# Patient Record
Sex: Female | Born: 1975 | Hispanic: No | Smoking: Current every day smoker
Health system: Southern US, Community
[De-identification: ages and names within clinical notes are randomized; demographics above are authoritative.]

## PROBLEM LIST (undated history)

## (undated) DIAGNOSIS — H9190 Unspecified hearing loss, unspecified ear: Secondary | ICD-10-CM

## (undated) DIAGNOSIS — F32A Depression, unspecified: Secondary | ICD-10-CM

## (undated) DIAGNOSIS — G709 Myoneural disorder, unspecified: Secondary | ICD-10-CM

## (undated) DIAGNOSIS — E0590 Thyrotoxicosis, unspecified without thyrotoxic crisis or storm: Secondary | ICD-10-CM

## (undated) DIAGNOSIS — N920 Excessive and frequent menstruation with regular cycle: Secondary | ICD-10-CM

## (undated) DIAGNOSIS — N8110 Cystocele, unspecified: Secondary | ICD-10-CM

## (undated) DIAGNOSIS — M1990 Unspecified osteoarthritis, unspecified site: Secondary | ICD-10-CM

## (undated) DIAGNOSIS — J449 Chronic obstructive pulmonary disease, unspecified: Secondary | ICD-10-CM

## (undated) DIAGNOSIS — F329 Major depressive disorder, single episode, unspecified: Secondary | ICD-10-CM

## (undated) DIAGNOSIS — R102 Pelvic and perineal pain: Secondary | ICD-10-CM

## (undated) DIAGNOSIS — N83209 Unspecified ovarian cyst, unspecified side: Secondary | ICD-10-CM

## (undated) DIAGNOSIS — F419 Anxiety disorder, unspecified: Secondary | ICD-10-CM

## (undated) DIAGNOSIS — Z5189 Encounter for other specified aftercare: Secondary | ICD-10-CM

## (undated) HISTORY — PX: FOOT SURGERY: SHX648

## (undated) HISTORY — PX: OTHER SURGICAL HISTORY: SHX169

## (undated) HISTORY — DX: Depression, unspecified: F32.A

## (undated) HISTORY — DX: Anxiety disorder, unspecified: F41.9

## (undated) HISTORY — DX: Cystocele, unspecified: N81.10

## (undated) HISTORY — DX: Encounter for other specified aftercare: Z51.89

## (undated) HISTORY — DX: Pelvic and perineal pain: R10.2

## (undated) HISTORY — DX: Chronic obstructive pulmonary disease, unspecified: J44.9

## (undated) HISTORY — DX: Myoneural disorder, unspecified: G70.9

## (undated) HISTORY — DX: Major depressive disorder, single episode, unspecified: F32.9

## (undated) HISTORY — DX: Excessive and frequent menstruation with regular cycle: N92.0

## (undated) HISTORY — PX: WISDOM TOOTH EXTRACTION: SHX21

---

## 2001-05-23 HISTORY — PX: DIAGNOSTIC LAPAROSCOPY: SUR761

## 2006-03-15 ENCOUNTER — Emergency Department (HOSPITAL_COMMUNITY): Admission: EM | Admit: 2006-03-15 | Discharge: 2006-03-15 | Payer: Self-pay | Admitting: Emergency Medicine

## 2007-05-24 DIAGNOSIS — G709 Myoneural disorder, unspecified: Secondary | ICD-10-CM

## 2007-05-24 HISTORY — DX: Myoneural disorder, unspecified: G70.9

## 2009-04-07 ENCOUNTER — Emergency Department (HOSPITAL_COMMUNITY): Admission: EM | Admit: 2009-04-07 | Discharge: 2009-04-07 | Payer: Self-pay | Admitting: Emergency Medicine

## 2010-08-25 LAB — URINALYSIS, ROUTINE W REFLEX MICROSCOPIC
Ketones, ur: NEGATIVE mg/dL
Protein, ur: NEGATIVE mg/dL
Urobilinogen, UA: 0.2 mg/dL (ref 0.0–1.0)

## 2010-08-25 LAB — POCT PREGNANCY, URINE: Preg Test, Ur: NEGATIVE

## 2010-08-25 LAB — URINE MICROSCOPIC-ADD ON

## 2011-07-12 ENCOUNTER — Encounter (HOSPITAL_COMMUNITY): Payer: Self-pay | Admitting: Emergency Medicine

## 2011-07-12 ENCOUNTER — Emergency Department (HOSPITAL_COMMUNITY)
Admission: EM | Admit: 2011-07-12 | Discharge: 2011-07-12 | Disposition: A | Discharge status: Home / Self Care | Payer: Self-pay | Attending: Emergency Medicine | Admitting: Emergency Medicine

## 2011-07-12 DIAGNOSIS — N949 Unspecified condition associated with female genital organs and menstrual cycle: Secondary | ICD-10-CM | POA: Insufficient documentation

## 2011-07-12 NOTE — ED Notes (Signed)
Pt. Called x[facility] 1. Interpreter stated pt. Left to get a drink.

## 2011-07-12 NOTE — ED Notes (Signed)
Called for interpreter at 1645.  Interpreter here at 1715.

## 2011-07-12 NOTE — ED Notes (Addendum)
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

## 2011-07-12 NOTE — ED Notes (Signed)
Patient leaving now--thru sign interperter making it known that she won't have a ride home later and that she will follow up with PCP in AM.

## 2011-07-13 ENCOUNTER — Encounter (HOSPITAL_COMMUNITY): Payer: Self-pay | Admitting: Emergency Medicine

## 2011-07-13 ENCOUNTER — Emergency Department (HOSPITAL_COMMUNITY): Payer: Medicaid Other

## 2011-07-13 ENCOUNTER — Emergency Department (HOSPITAL_COMMUNITY)
Admission: EM | Admit: 2011-07-13 | Discharge: 2011-07-13 | Disposition: A | Discharge status: Home Health | Payer: Medicaid Other | Attending: Emergency Medicine | Admitting: Emergency Medicine

## 2011-07-13 DIAGNOSIS — R102 Pelvic and perineal pain: Secondary | ICD-10-CM

## 2011-07-13 DIAGNOSIS — F17200 Nicotine dependence, unspecified, uncomplicated: Secondary | ICD-10-CM | POA: Insufficient documentation

## 2011-07-13 DIAGNOSIS — R1032 Left lower quadrant pain: Secondary | ICD-10-CM | POA: Insufficient documentation

## 2011-07-13 LAB — URINALYSIS, ROUTINE W REFLEX MICROSCOPIC
Bilirubin Urine: NEGATIVE
Nitrite: NEGATIVE
Specific Gravity, Urine: <1.005 — ABNORMAL LOW (ref 1.005–1.030)
pH: 6.5 (ref 5.0–8.0)

## 2011-07-13 LAB — URINE MICROSCOPIC-ADD ON

## 2011-07-13 LAB — CBC
MCH: 31.9 pg (ref 26.0–34.0)
MCHC: 35.2 g/dL (ref 30.0–36.0)
MCV: 90.4 fL (ref 78.0–100.0)
Platelets: 445 10*3/uL — ABNORMAL HIGH (ref 150–400)
RBC: 4.27 MIL/uL (ref 3.87–5.11)
RDW: 14.6 % (ref 11.5–15.5)

## 2011-07-13 LAB — POCT I-STAT, CHEM 8
BUN: 5 mg/dL — ABNORMAL LOW (ref 6–23)
Calcium, Ion: 1.18 mmol/L (ref 1.12–1.32)
Chloride: 107 mEq/L (ref 96–112)
Glucose, Bld: 89 mg/dL (ref 70–99)
Potassium: 3.5 mEq/L (ref 3.5–5.1)

## 2011-07-13 LAB — DIFFERENTIAL
Basophils Absolute: 0.1 10*3/uL (ref 0.0–0.1)
Basophils Relative: 1 % (ref 0–1)
Eosinophils Absolute: 0.2 10*3/uL (ref 0.0–0.7)
Eosinophils Relative: 2 % (ref 0–5)
Neutrophils Relative %: 69 % (ref 43–77)

## 2011-07-13 LAB — WET PREP, GENITAL
Trich, Wet Prep: NONE SEEN
Yeast Wet Prep HPF POC: NONE SEEN

## 2011-07-13 LAB — POCT PREGNANCY, URINE: Preg Test, Ur: NEGATIVE

## 2011-07-13 MED ORDER — OXYCODONE-ACETAMINOPHEN 5-325 MG PO TABS: 1.0000 | ORAL_TABLET | ORAL | Status: AC | PRN

## 2011-07-13 MED ORDER — OXYCODONE-ACETAMINOPHEN 5-325 MG PO TABS
1.0000 | ORAL_TABLET | Freq: Once | ORAL | Status: AC
  Administered 2011-07-13: 1 via ORAL
  Filled 2011-07-13: qty 1

## 2011-07-13 NOTE — ED Provider Notes (Signed)
Medical screening examination/treatment/procedure(s) were performed by non-physician practitioner and as supervising physician I was immediately available for consultation/collaboration.  Juliet Rude. Rubin Payor, MD 07/13/11 [phone]

## 2011-07-13 NOTE — ED Notes (Signed)
Interpretor now at bedside

## 2011-07-13 NOTE — ED Provider Notes (Signed)
History     CSN: [id number]  Arrival date & time 07/13/11  0820   First MD Initiated Contact with Patient 07/13/11 [phone]      Chief Complaint  Patient presents with  . Abdominal Pain    (Consider location/radiation/quality/duration/timing/severity/associated sxs/prior treatment) Patient is a 36 y.o. female presenting with abdominal pain. The history is provided by the patient.  Abdominal Pain The primary symptoms of the illness include abdominal pain. The primary symptoms of the illness do not include fever, nausea or vomiting. 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 She reports regular left pelvic pain in associationwith her menses that is markedly worse in the last 2 months.   The abdominal pain is located in the LLQ. The abdominal pain is relieved by nothing.  The patient has not had a change in bowel habit. Additional symptoms associated with the illness include frequency. Symptoms associated with the illness do not include chills, urgency or hematuria.    History reviewed. No pertinent past medical history.  Past Surgical History  Procedure Date  . Ovary tumor     History reviewed. No pertinent family history.  History  Substance Use Topics  . Smoking status: Current Everyday Smoker -- 1.0 packs/day  . Smokeless tobacco: Not on file  . Alcohol Use: No    OB History    Grav Para Term Preterm Abortions TAB SAB Ect Mult Living                  Review of Systems  Constitutional: Negative for fever and chills.  HENT: Negative.   Respiratory: Negative.   Cardiovascular: Negative.   Gastrointestinal: Positive for abdominal pain. Negative for nausea and vomiting.  Genitourinary: Positive for frequency. Negative for urgency and hematuria.  Musculoskeletal: Negative.   Skin: Negative.   Neurological: Negative.     Allergies  Review of patient's allergies indicates no known allergies.  Home Medications  No current  outpatient prescriptions on file.  BP 107/60  Pulse 75  Temp(Src) 97.6 F (36.4 C) (Oral)  Resp 24  SpO2 99%  LMP 06/20/2011  Physical Exam  Constitutional: She appears well-developed and well-nourished.  HENT:  Head: Normocephalic.  Neck: Normal range of motion. Neck supple.  Cardiovascular: Normal rate and regular rhythm.   Pulmonary/Chest: Effort normal and breath sounds normal.  Abdominal: Soft. Bowel sounds are normal. There is no tenderness. There is no rebound and no guarding.  Genitourinary: Vagina normal and uterus normal. No vaginal discharge found.       No adnexal mass palpable. Left adnexal tenderness.  Musculoskeletal: Normal range of motion.  Neurological: She is alert. No cranial nerve deficit.  Skin: Skin is warm and dry. No rash noted.  Psychiatric: She has a normal mood and affect.    ED Course  Procedures (including critical care time)  Labs Reviewed  URINALYSIS, ROUTINE W REFLEX MICROSCOPIC - Abnormal; Notable for the following:    Specific Gravity, Urine <1.005 (*)    Hgb urine dipstick SMALL (*)    Leukocytes, UA TRACE (*)    All other components within normal limits  URINE MICROSCOPIC-ADD ON - Abnormal; Notable for the following:    Squamous Epithelial / LPF FEW (*)    All other components within normal limits  CBC - Abnormal; Notable for the following:    WBC 11.4 (*)    Platelets 445 (*)    All other components within normal limits  DIFFERENTIAL - Abnormal;  Notable for the following:    Neutro Abs 7.9 (*)    All other components within normal limits  POCT I-STAT, CHEM 8 - Abnormal; Notable for the following:    BUN 5 (*)    All other components within normal limits  POCT PREGNANCY, URINE  WET PREP, GENITAL  GC/CHLAMYDIA PROBE AMP, GENITAL   No results found.   No diagnosis found.    MDM          Rodena Medin, PA-C 07/13/11 1226

## 2011-07-13 NOTE — ED Notes (Signed)
Patient is waiting for the interpreter

## 2011-07-13 NOTE — Discharge Instructions (Signed)
YOUR ULTRASOUND, BLOOD AND URINE TESTS ARE ALL NEGATIVE FOR EVIDENCE OF INFECTION  OR RECURRENT OVARIAN TUMOR. YOU CAN BE DISCHARGED HOME AND SHOULD FOLLOW UP WITH A GYNECOLOGIST CLINIC OF YOUR CHOICE. RETURN HERE AS NEEDED.

## 2011-07-13 NOTE — ED Notes (Signed)
Patient is hearing impaired with c/o ovary pain.  States that she as here last night but had to leave because of the bus leaving and she had to get home.

## 2011-07-13 NOTE — ED Notes (Signed)
Patient transported to Ultrasound 

## 2011-12-21 ENCOUNTER — Encounter (HOSPITAL_COMMUNITY): Payer: Self-pay | Admitting: *Deleted

## 2011-12-21 ENCOUNTER — Emergency Department (HOSPITAL_COMMUNITY): Payer: Medicaid Other

## 2011-12-21 ENCOUNTER — Emergency Department (HOSPITAL_COMMUNITY)
Admission: EM | Admit: 2011-12-21 | Discharge: 2011-12-21 | Disposition: A | Discharge status: Home / Self Care | Payer: Medicaid Other | Attending: Emergency Medicine | Admitting: Emergency Medicine

## 2011-12-21 DIAGNOSIS — N83209 Unspecified ovarian cyst, unspecified side: Secondary | ICD-10-CM | POA: Insufficient documentation

## 2011-12-21 DIAGNOSIS — F17200 Nicotine dependence, unspecified, uncomplicated: Secondary | ICD-10-CM | POA: Insufficient documentation

## 2011-12-21 DIAGNOSIS — M549 Dorsalgia, unspecified: Secondary | ICD-10-CM | POA: Insufficient documentation

## 2011-12-21 HISTORY — DX: Unspecified hearing loss, unspecified ear: H91.90

## 2011-12-21 LAB — URINALYSIS, ROUTINE W REFLEX MICROSCOPIC
Leukocytes, UA: NEGATIVE
Nitrite: NEGATIVE
Protein, ur: NEGATIVE mg/dL
Specific Gravity, Urine: 1.019 (ref 1.005–1.030)
Urobilinogen, UA: 0.2 mg/dL (ref 0.0–1.0)

## 2011-12-21 LAB — CBC WITH DIFFERENTIAL/PLATELET
Basophils Absolute: 0.1 10*3/uL (ref 0.0–0.1)
Basophils Relative: 1 % (ref 0–1)
Eosinophils Relative: 2 % (ref 0–5)
HCT: 40 % (ref 36.0–46.0)
Hemoglobin: 14 g/dL (ref 12.0–15.0)
Lymphocytes Relative: 28 % (ref 12–46)
MCHC: 35 g/dL (ref 30.0–36.0)
MCV: 92 fL (ref 78.0–100.0)
Monocytes Absolute: 0.8 10*3/uL (ref 0.1–1.0)
Monocytes Relative: 7 % (ref 3–12)
Neutro Abs: 6.8 10*3/uL (ref 1.7–7.7)
RDW: 14.7 % (ref 11.5–15.5)

## 2011-12-21 LAB — COMPREHENSIVE METABOLIC PANEL
AST: 18 U/L (ref 0–37)
BUN: 7 mg/dL (ref 6–23)
CO2: 22 mEq/L (ref 19–32)
Calcium: 8.6 mg/dL (ref 8.4–10.5)
Chloride: 104 mEq/L (ref 96–112)
Creatinine, Ser: 0.55 mg/dL (ref 0.50–1.10)
GFR calc Af Amer: >90 mL/min (ref >90)
GFR calc non Af Amer: >90 mL/min (ref >90)
Total Bilirubin: 0.3 mg/dL (ref 0.3–1.2)

## 2011-12-21 LAB — URINE MICROSCOPIC-ADD ON

## 2011-12-21 LAB — WET PREP, GENITAL
Clue Cells Wet Prep HPF POC: NONE SEEN
WBC, Wet Prep HPF POC: NONE SEEN

## 2011-12-21 LAB — PREGNANCY, URINE: Preg Test, Ur: NEGATIVE

## 2011-12-21 MED ORDER — HYDROCODONE-ACETAMINOPHEN 5-325 MG PO TABS
1.0000 | ORAL_TABLET | Freq: Once | ORAL | Status: AC
  Administered 2011-12-21: 1 via ORAL
  Filled 2011-12-21: qty 1

## 2011-12-21 MED ORDER — HYDROCODONE-ACETAMINOPHEN 5-325 MG PO TABS: 1.0000 | ORAL_TABLET | ORAL | Status: AC | PRN

## 2011-12-21 NOTE — ED Notes (Signed)
C/o left side pain, abd pain and swelling. Sign language interpreter called for pt. Pt writes that she is having 'ovary pain real bad'.

## 2011-12-21 NOTE — ED Notes (Signed)
Had left ovarian cyst removed in 2003. C/o LLQ/suprapubic area pain since Feb (seen here for same in Feb). Was instructed to f/u with gynecology but has not been able to see one. C/o worsening pain, heavy menstrual bleeding, bloating & nausea since then. Reports pain much worse with periods. Last emesis 1 week ago.  C/o LLQ pain radiates into LLE.

## 2011-12-21 NOTE — ED Notes (Signed)
Patient transported to Ultrasound 

## 2011-12-21 NOTE — ED Provider Notes (Signed)
History     CSN: [id number]  Arrival date & time 12/21/11  6213   First MD Initiated Contact with Patient 12/21/11 1001      Chief Complaint  Patient presents with  . Abdominal Pain  . Back Pain    (Consider location/radiation/quality/duration/timing/severity/associated sxs/prior treatment) HPI Comments: Is a 36 year old woman who has recurring bouts of left lower quadrant abdominal pain. She was seen here in February, and was found to have a left ovarian cyst. Recently her pain has recurred. She's had some vomiting and diarrhea some painful urination her last menstrual period was July 5-10, and was heavy with clots. She has not seen a gynecologist recently. He was obtained with the help of interpreter for the deaf.  Patient is a 36 y.o. female presenting with abdominal pain. The history is provided by the patient and medical records. A language interpreter was used.  Abdominal Pain The primary symptoms of the illness include abdominal pain, nausea and vomiting. The primary symptoms of the illness do not include fever or diarrhea. Episode onset: Chronic recurring issue. The onset of the illness was gradual. The problem has been gradually worsening.  Associated with: Nothing. The patient states that she believes she is currently not pregnant. The patient has not had a change in bowel habit. Risk factors for an acute abdominal problem include a history of abdominal surgery. Symptoms associated with the illness do not include chills, urgency, hematuria, frequency or back pain. Associated medical issues comments: History of left ovarian cyst..    Past Medical History  Diagnosis Date  . Deaf     Past Surgical History  Procedure Date  . Ovary tumor     cyst    History reviewed. No pertinent family history.  History  Substance Use Topics  . Smoking status: Current Everyday Smoker -- 1.0 packs/day  . Smokeless tobacco: Not on file  . Alcohol Use: No    OB History    Grav Para  Term Preterm Abortions TAB SAB Ect Mult Living                  Review of Systems  Constitutional: Negative for fever and chills.  HENT: Negative.   Eyes: Negative.   Respiratory: Negative.   Cardiovascular: Negative.   Gastrointestinal: Positive for nausea, vomiting and abdominal pain. Negative for diarrhea.  Genitourinary: Positive for pelvic pain. Negative for urgency, frequency and hematuria.  Musculoskeletal: Negative for back pain.  Skin: Negative.   Neurological: Negative.   Psychiatric/Behavioral: Negative.     Allergies  Review of patient's allergies indicates no known allergies.  Home Medications   Current Outpatient Rx  Name Route Sig Dispense Refill  . NAPROXEN SODIUM 220 MG PO TABS Oral Take 220-440 mg by mouth daily as needed. As needed for pain.      BP 118/75  Pulse 92  Temp 98.6 F (37 C) (Oral)  Resp 20  SpO2 97%  LMP 11/25/2011  Physical Exam  Constitutional: She is oriented to person, place, and time. She appears well-developed and well-nourished.       In moderate distress with pain in the left lower abdominal quadrant.  HENT:  Head: Normocephalic and atraumatic.  Right Ear: External ear normal.  Left Ear: External ear normal.  Mouth/Throat: Oropharynx is clear and moist.  Eyes: Conjunctivae and EOM are normal. Pupils are equal, round, and reactive to light.  Neck: Normal range of motion. Neck supple.  Cardiovascular: Normal rate, regular rhythm and normal heart sounds.  Pulmonary/Chest: Effort normal and breath sounds normal.  Abdominal: Soft. Bowel sounds are normal.       Mild LLQ tenderness.   Genitourinary:       BUS negative.  No discharge.  Has no uterine tenderness, but does have left adnexal tenderness.   Neurological: She is alert and oriented to person, place, and time.       She is deaf.  Otherwise neurologically intact.  Skin: Skin is warm and dry.  Psychiatric: She has a normal mood and affect. Her behavior is normal.     ED Course  Procedures (including critical care time)  Labs Reviewed  CBC WITH DIFFERENTIAL - Abnormal; Notable for the following:    WBC 11.1 (*)     All other components within normal limits  PREGNANCY, URINE  COMPREHENSIVE METABOLIC PANEL  URINALYSIS, ROUTINE W REFLEX MICROSCOPIC  URINE CULTURE  WET PREP, GENITAL  GC/CHLAMYDIA PROBE AMP, GENITAL   Results for orders placed during the hospital encounter of 12/21/11  CBC WITH DIFFERENTIAL      Component Value Range   WBC 11.1 (*) 4.0 - 10.5 K/uL   RBC 4.35  3.87 - 5.11 MIL/uL   Hemoglobin 14.0  12.0 - 15.0 g/dL   HCT 09.8  11.9 - 14.7 %   MCV 92.0  78.0 - 100.0 fL   MCH 32.2  26.0 - 34.0 pg   MCHC 35.0  30.0 - 36.0 g/dL   RDW 82.9  56.2 - 13.0 %   Platelets 388  150 - 400 K/uL   Neutrophils Relative 61  43 - 77 %   Neutro Abs 6.8  1.7 - 7.7 K/uL   Lymphocytes Relative 28  12 - 46 %   Lymphs Abs 3.1  0.7 - 4.0 K/uL   Monocytes Relative 7  3 - 12 %   Monocytes Absolute 0.8  0.1 - 1.0 K/uL   Eosinophils Relative 2  0 - 5 %   Eosinophils Absolute 0.2  0.0 - 0.7 K/uL   Basophils Relative 1  0 - 1 %   Basophils Absolute 0.1  0.0 - 0.1 K/uL  COMPREHENSIVE METABOLIC PANEL      Component Value Range   Sodium 136  135 - 145 mEq/L   Potassium 3.7  3.5 - 5.1 mEq/L   Chloride 104  96 - 112 mEq/L   CO2 22  19 - 32 mEq/L   Glucose, Bld 89  70 - 99 mg/dL   BUN 7  6 - 23 mg/dL   Creatinine, Ser 8.65  0.50 - 1.10 mg/dL   Calcium 8.6  8.4 - 78.4 mg/dL   Total Protein 6.8  6.0 - 8.3 g/dL   Albumin 3.7  3.5 - 5.2 g/dL   AST 18  0 - 37 U/L   ALT 13  0 - 35 U/L   Alkaline Phosphatase 74  39 - 117 U/L   Total Bilirubin 0.3  0.3 - 1.2 mg/dL   GFR calc non Af Amer >90  >90 mL/min   GFR calc Af Amer >90  >90 mL/min  URINALYSIS, ROUTINE W REFLEX MICROSCOPIC      Component Value Range   Color, Urine YELLOW  YELLOW   APPearance CLEAR  CLEAR   Specific Gravity, Urine 1.019  1.005 - 1.030   pH 6.0  5.0 - 8.0   Glucose, UA NEGATIVE   NEGATIVE mg/dL   Hgb urine dipstick MODERATE (*) NEGATIVE   Bilirubin Urine NEGATIVE  NEGATIVE  Ketones, ur NEGATIVE  NEGATIVE mg/dL   Protein, ur NEGATIVE  NEGATIVE mg/dL   Urobilinogen, UA 0.2  0.0 - 1.0 mg/dL   Nitrite NEGATIVE  NEGATIVE   Leukocytes, UA NEGATIVE  NEGATIVE  PREGNANCY, URINE      Component Value Range   Preg Test, Ur NEGATIVE  NEGATIVE  WET PREP, GENITAL      Component Value Range   Yeast Wet Prep HPF POC NONE SEEN  NONE SEEN   Trich, Wet Prep NONE SEEN  NONE SEEN   Clue Cells Wet Prep HPF POC NONE SEEN  NONE SEEN   WBC, Wet Prep HPF POC NONE SEEN  NONE SEEN  URINE MICROSCOPIC-ADD ON      Component Value Range   Squamous Epithelial / LPF FEW (*) RARE   WBC, UA 0-2  <3 WBC/hpf   RBC / HPF 3-6  <3 RBC/hpf   Bacteria, UA FEW (*) RARE   Urine-Other MUCOUS PRESENT     US Transvaginal Non-ob  12/21/2011  *RADIOLOGY REPORT*  Clinical Data: Left sided pelvic pain.  History of left ovarian cyst.  TRANSVAGINAL ULTRASOUND OF PELVIS  Technique:  Transvaginal ultrasound examination of the pelvis was performed including evaluation of the uterus, ovaries, adnexal regions, and pelvic cul-de-sac.  Comparison:  Pelvic ultrasound 07/13/2011  Findings:  Uterus:  Normal in size and appearance.  Endometrium: Normal in thickness and appearance.  Measures 9 mm.  Right ovary: Normal appearance/no adnexal mass.  Measures 3.8 x 2.1 x 3.6 cm.  Left ovary: Measures 7.7 x 4.1 x 5.3 cm.  Contains two separate complex cysts.  One of the complex cysts contains thin linear internal echogenic septations, and fine internal linear echoes. This complex cyst measures 3.7 x 3.2 x 3.3 cm.  The second cyst measures 4.7 x 3.2 x 3.0 cm and contains a few internal echogenic septations as well as simple appearing fluid.  Color Doppler flow is identified to the left ovary parenchyma, but is not seen within the cystic components or their septations.  Other Findings:  No free fluid  IMPRESSION:  1. The left ovary is  enlarged by tube complex cysts, measuring 3.7 and 4.7 cm maximum diameter respectively.  One of this cyst has an appearance typical for a hemorrhagic cyst.  Endometriomas cannot be excluded. Short-interval follow up ultrasound in 6-12 weeks is recommended, preferably during the week following the patient's normal menses.  2.  Normal uterus and right ovary.  Original Report Authenticated By: Britta Mccreedy, M.D.   Ultrasound showed 2 cysts in the left ovary.  She was advised to take hydrocodone-acetaminophen q4h prn pain.  I called [facility], and the [facility] will call her with a followup appointment.    1. Ovarian cyst          Carleene Cooper III, MD 12/21/11 [phone]

## 2011-12-21 NOTE — ED Notes (Signed)
Sign language interpreter now present at bedside.  Pt here alone

## 2011-12-22 LAB — URINE CULTURE

## 2011-12-22 LAB — GC/CHLAMYDIA PROBE AMP, GENITAL: Chlamydia, DNA Probe: NEGATIVE

## 2012-01-13 ENCOUNTER — Ambulatory Visit (INDEPENDENT_AMBULATORY_CARE_PROVIDER_SITE_OTHER): Payer: Medicaid Other | Admitting: Obstetrics & Gynecology

## 2012-01-13 ENCOUNTER — Encounter: Payer: Self-pay | Admitting: Obstetrics & Gynecology

## 2012-01-13 ENCOUNTER — Other Ambulatory Visit: Payer: Self-pay | Admitting: Obstetrics & Gynecology

## 2012-01-13 VITALS — BP 106/72 | HR 69 | Temp 97.4°F | Resp 12 | Ht 69.0 in | Wt 163.8 lb

## 2012-01-13 DIAGNOSIS — N83209 Unspecified ovarian cyst, unspecified side: Secondary | ICD-10-CM

## 2012-01-13 DIAGNOSIS — N920 Excessive and frequent menstruation with regular cycle: Secondary | ICD-10-CM

## 2012-01-13 DIAGNOSIS — N946 Dysmenorrhea, unspecified: Secondary | ICD-10-CM

## 2012-01-13 DIAGNOSIS — Z01419 Encounter for gynecological examination (general) (routine) without abnormal findings: Secondary | ICD-10-CM

## 2012-01-13 DIAGNOSIS — M549 Dorsalgia, unspecified: Secondary | ICD-10-CM

## 2012-01-13 MED ORDER — DICLOFENAC SODIUM 75 MG PO TBEC: 75.0000 mg | DELAYED_RELEASE_TABLET | Freq: Two times a day (BID) | ORAL | Status: DC

## 2012-01-13 MED ORDER — NORGESTIM-ETH ESTRAD TRIPHASIC 0.18/0.215/0.25 MG-25 MCG PO TABS: 1.0000 | ORAL_TABLET | Freq: Every day | ORAL | Status: DC

## 2012-01-13 NOTE — Patient Instructions (Signed)
Smoking Cessation This document explains the best ways for you to quit smoking and new treatments to help. It lists new medicines that can double or triple your chances of quitting and quitting for good. It also considers ways to avoid relapses and concerns you may have about quitting, including weight gain. NICOTINE: A POWERFUL ADDICTION If you have tried to quit smoking, you know how hard it can be. It is hard because nicotine is a very addictive drug. For some people, it can be as addictive as heroin or cocaine. Usually, people make 2 or 3 tries, or more, before finally being able to quit. Each time you try to quit, you can learn about what helps and what hurts. Quitting takes hard work and a lot of effort, but you can quit smoking. QUITTING SMOKING IS ONE OF THE MOST IMPORTANT THINGS YOU WILL EVER DO.  You will live longer, feel better, and live better.   The impact on your body of quitting smoking is felt almost immediately:   Within 20 minutes, blood pressure decreases. Pulse returns to its normal level.   After 8 hours, carbon monoxide levels in the blood return to normal. Oxygen level increases.   After 24 hours, chance of heart attack starts to decrease. Breath, hair, and body stop smelling like smoke.   After 48 hours, damaged nerve endings begin to recover. Sense of taste and smell improve.   After 72 hours, the body is virtually free of nicotine. Bronchial tubes relax and breathing becomes easier.   After 2 to 12 weeks, lungs can hold more air. Exercise becomes easier and circulation improves.   Quitting will reduce your risk of having a heart attack, stroke, cancer, or lung disease:   After 1 year, the risk of coronary heart disease is cut in half.   After 5 years, the risk of stroke falls to the same as a nonsmoker.   After 10 years, the risk of lung cancer is cut in half and the risk of other cancers decreases significantly.   After 15 years, the risk of coronary heart  disease drops, usually to the level of a nonsmoker.   If you are pregnant, quitting smoking will improve your chances of having a healthy baby.   The people you live with, especially your children, will be healthier.   You will have extra money to spend on things other than cigarettes.  FIVE KEYS TO QUITTING Studies have shown that these 5 steps will help you quit smoking and quit for good. You have the best chances of quitting if you use them together: 1. Get ready.  2. Get support and encouragement.  3. Learn new skills and behaviors.  4. Get medicine to reduce your nicotine addiction and use it correctly.  5. Be prepared for relapse or difficult situations. Be determined to continue trying to quit, even if you do not succeed at first.  1. GET READY  Set a quit date.   Change your environment.   Get rid of ALL cigarettes, ashtrays, matches, and lighters in your home, car, and place of work.   Do not let people smoke in your home.   Review your past attempts to quit. Think about what worked and what did not.   Once you quit, do not smoke. NOT EVEN A PUFF!  2. GET SUPPORT AND ENCOURAGEMENT Studies have shown that you have a better chance of being successful if you have help. You can get support in many ways.  Tell   your family, friends, and coworkers that you are going to quit and need their support. Ask them not to smoke around you.   Talk to your caregivers (doctor, dentist, nurse, pharmacist, psychologist, and/or smoking counselor).   Get individual, group, or telephone counseling and support. The more counseling you have, the better your chances are of quitting. Programs are available at local hospitals and health centers. Call your local health department for information about programs in your area.   Spiritual beliefs and practices may help some smokers quit.   Quit meters are small computer programs online or downloadable that keep track of quit statistics, such as amount  of "quit-time," cigarettes not smoked, and money saved.   Many smokers find one or more of the many self-help books available useful in helping them quit and stay off tobacco.  3. LEARN NEW SKILLS AND BEHAVIORS  Try to distract yourself from urges to smoke. Talk to someone, go for a walk, or occupy your time with a task.   When you first try to quit, change your routine. Take a different route to work. Drink tea instead of coffee. Eat breakfast in a different place.   Do something to reduce your stress. Take a hot bath, exercise, or read a book.   Plan something enjoyable to do every day. Reward yourself for not smoking.   Explore interactive web-based programs that specialize in helping you quit.  4. GET MEDICINE AND USE IT CORRECTLY Medicines can help you stop smoking and decrease the urge to smoke. Combining medicine with the above behavioral methods and support can quadruple your chances of successfully quitting smoking. The [place] Food and Drug Administration (FDA) has approved 7 medicines to help you quit smoking. These medicines fall into 3 categories.  Nicotine replacement therapy (delivers nicotine to your body without the negative effects and risks of smoking):   Nicotine gum: Available over-the-counter.   Nicotine lozenges: Available over-the-counter.   Nicotine inhaler: Available by prescription.   Nicotine nasal spray: Available by prescription.   Nicotine skin patches (transdermal): Available by prescription and over-the-counter.   Antidepressant medicine (helps people abstain from smoking, but how this works is unknown):   Bupropion sustained-release (SR) tablets: Available by prescription.   Nicotinic receptor partial agonist (simulates the effect of nicotine in your brain):   Varenicline tartrate tablets: Available by prescription.   Ask your caregiver for advice about which medicines to use and how to use them. Carefully read the information on the package.    Everyone who is trying to quit may benefit from using a medicine. If you are pregnant or trying to become pregnant, nursing an infant, you are under age 18, or you smoke fewer than 10 cigarettes per day, talk to your caregiver before taking any nicotine replacement medicines.   You should stop using a nicotine replacement product and call your caregiver if you experience nausea, dizziness, weakness, vomiting, fast or irregular heartbeat, mouth problems with the lozenge or gum, or redness or swelling of the skin around the patch that does not go away.   Do not use any other product containing nicotine while using a nicotine replacement product.   Talk to your caregiver before using these products if you have diabetes, heart disease, asthma, stomach ulcers, you had a recent heart attack, you have high blood pressure that is not controlled with medicine, a history of irregular heartbeat, or you have been prescribed medicine to help you quit smoking.  5. BE PREPARED FOR RELAPSE OR   DIFFICULT SITUATIONS  Most relapses occur within the first 3 months after quitting. Do not be discouraged if you start smoking again. Remember, most people try several times before they finally quit.   You may have symptoms of withdrawal because your body is used to nicotine. You may crave cigarettes, be irritable, feel very hungry, cough often, get headaches, or have difficulty concentrating.   The withdrawal symptoms are only temporary. They are strongest when you first quit, but they will go away within 10 to 14 days.  Here are some difficult situations to watch for:  Alcohol. Avoid drinking alcohol. Drinking lowers your chances of successfully quitting.   Caffeine. Try to reduce the amount of caffeine you consume. It also lowers your chances of successfully quitting.   Other smokers. Being around smoking can make you want to smoke. Avoid smokers.   Weight gain. Many smokers will gain weight when they quit, usually  less than 10 pounds. Eat a healthy diet and stay active. Do not let weight gain distract you from your main goal, quitting smoking. Some medicines that help you quit smoking may also help delay weight gain. You can always lose the weight gained after you quit.   Bad mood or depression. There are a lot of ways to improve your mood other than smoking.  If you are having problems with any of these situations, talk to your caregiver. SPECIAL SITUATIONS AND CONDITIONS Studies suggest that everyone can quit smoking. Your situation or condition can give you a special reason to quit.  Pregnant women/new mothers: By quitting, you protect your baby's health and your own.   Hospitalized patients: By quitting, you reduce health problems and help healing.   Heart attack patients: By quitting, you reduce your risk of a second heart attack.   Lung, head, and neck cancer patients: By quitting, you reduce your chance of a second cancer.   Parents of children and adolescents: By quitting, you protect your children from illnesses caused by secondhand smoke.  QUESTIONS TO THINK ABOUT Think about the following questions before you try to stop smoking. You may want to talk about your answers with your caregiver.  Why do you want to quit?   If you tried to quit in the past, what helped and what did not?   What will be the most difficult situations for you after you quit? How will you plan to handle them?   Who can help you through the tough times? Your family? Friends? Caregiver?   What pleasures do you get from smoking? What ways can you still get pleasure if you quit?  Here are some questions to ask your caregiver:  How can you help me to be successful at quitting?   What medicine do you think would be best for me and how should I take it?   What should I do if I need more help?   What is smoking withdrawal like? How can I get information on withdrawal?  Quitting takes hard work and a lot of effort,  but you can quit smoking. FOR MORE INFORMATION  Smokefree.gov (http://www.davis-sullivan.com/) provides free, accurate, evidence-based information and professional assistance to help support the immediate and long-term needs of people trying to quit smoking. Document Released: 05/03/2001 Document Revised: 04/28/2011 Document Reviewed: 02/23/2009 [facility] Patient Information 2012 [place], [place].Oral Contraception Information Oral contraceptives (OCs) are medicines taken to prevent pregnancy. OCs work by preventing the ovaries from releasing eggs. The hormones in OCs also cause the cervical mucus to thicken,  preventing the sperm from entering the uterus. The hormones also cause the uterine lining to become thin, not allowing a fertilized egg to attach to the inside of the uterus. OCs are highly effective when taken exactly as prescribed. However, OCs do not prevent sexually transmitted diseases (STDs). Safe sex practices, such as using condoms along with the pill, can help prevent STDs.  Before taking the pill, you may have a physical exam and Pap test. Your caregiver may order blood tests that may be necessary. Your caregiver will make sure you are a good candidate for oral contraception. Discuss with your caregiver the possible side effects of the OC you may be prescribed. When starting an OC, it can take 2 to 3 months for the body to adjust to the changes in hormone levels in your body.  TYPES OF ORAL CONTRACEPTION  The combination pill. This pill contains estrogen and progestin (synthetic progesterone) hormones. The combination pill comes in either 21-day or 28-day packs. With 21-day packs, you do not take pills for 7 days after the last pill. With 28-day packs, the pill is taken every day. The last 7 pills are without hormones. Certain types of pills have more than 21 hormone-containing pills.   The minipill. This pill contains the progesterone hormone only. It is taken every day continuously. The minipill  comes in packs of 91 pills. The first 84 pills contain the hormones, and the last 7 pills do not. The last 7 days are when you will have your menstrual period. You may experience irregular spotting.  ADVANTAGES  Decreases premenstrual symptoms.   Treats menstrual period cramps.   Regulates the menstrual cycle.   Decreases a heavy menstrual flow.   Treats acne.   Treats abnormal uterine bleeding.   Treats chronic pelvic pain.   Treats polycystic ovarian syndrome.   Treats endometriosis.   Can be used as emergency contraception.  DISADVANTAGES OCs can be less effective if:  You forget to take the pill at the same time every day.   You have a stomach or intestinal disease that lessens the absorption of the pill.   You take OCs with other medicines that make OCs less effective.   You take expired OCs.   You forget to restart the pill on day 7, when using the packs of 21 pills.  Document Released: 07/30/2002 Document Revised: 04/28/2011 Document Reviewed: 09/15/2010 [facility] Patient Information 2012 [place], [place].

## 2012-01-13 NOTE — Progress Notes (Signed)
  [facility]  Patient name: Latasha Parker MRN [id number]  Date of birth: 04-12-1976  CC & HPI:  Latasha Parker is a 36 y.o. female presenting today for follow up of Left lower pelvic pain.  She has been seen in the ED multiple times most recently in July of 2013.  She was found to have 2 cysts on her L ovary attributed to her pain.  Interpreter for the deaf.    ROS:  She also reports L radicular leg symptoms. L iliolumbar pain, chronic constipation, and dyspareunia.  No reports of loss of bowel or bladder.  Pertinent History Reviewed:   OB History    Grav Para Term Preterm Abortions TAB SAB Ect Mult Living   2 2 2       2      # Outc Date GA Lbr Len/2nd Wgt Sex Del Anes PTL Lv   1 TRM 1/00    F SVD   Yes   2 TRM 10/02    F SVD   Yes      Medical & Surgical Hx:  Reviewed: Significant for deaf, IBS,  Medications: Reviewed & Updated - see associated section Social History: Reviewed - Significant for current every day smoker  Objective Findings:  Vitals:  Filed Vitals:   01/13/12 1101  BP: 106/72  Pulse: 69  Temp: 97.4 F (36.3 C)  Resp: 12    PE: GENERAL:  Adult caucasian female.  In no discomfort; no respiratory distress. Deaf. PSYCH: Alert and appropriately interactive; Insight:Good   H&N: AT/[place], MMM, no scleral icterus, EOMi EXTREMITIES: Moves all 4 extremities spontaneously, warm well perfused, no edema, bilateral DP and PT pulses 2/4.   Pelvic exam: VULVA: normal appearing vulva with no masses, tenderness or lesions, VAGINA: normal appearing vagina with normal color and discharge, no lesions, CERVIX: ? ectropion present, lesion present At 11-1 & 3-4 o'clock, cervical motion tenderness present, multiparous os, UTERUS: uterus is normal size, shape, consistency and nontender, ADNEXA: normal adnexa in size, nontender and no masses, PAP: Pap smear done today MSK:  + L straight leg raise; + braggard stretch test.  TTP over L SI joint; no focal  weakness     Assessment & Plan:   36 yo female with multiple causes of pelvic and back pain.  # GYN (menorrhagia, ovarian cysts, dyspareunia, cervical motion tenderness and lesion) - Will start Sprintec to help regulate cycles.  If significant improvement will consider IUD as pt does wish for cessation of cycles.  NSAID for helping menorrhagia.  Schedule repeat US PAP obtained today.  # GI (bloating, constipation, ?IBS) - Pt reports on going issues.  Take Psyllium on infrequent basis.  Instructed to increase frequency to daily.  # MSK (Back pain, radicular leg pain, SI joint pain) - pt with ? HNP.  Pt to establish with PCP / ortho to further investigate her symptoms.  No red flags   Andrena Mews, DO [facility] Resident - PGY-2 01/13/2012 12:24 PM Urged smoking cessation. Agree with note, present for exam  ARNOLD,JAMES 12:27 PM 01/13/2012

## 2012-01-16 ENCOUNTER — Other Ambulatory Visit (HOSPITAL_COMMUNITY)
Admission: RE | Admit: 2012-01-16 | Discharge: 2012-01-16 | Disposition: A | Discharge status: Home / Self Care | Payer: Medicaid Other | Source: Ambulatory Visit | Attending: Obstetrics & Gynecology | Admitting: Obstetrics & Gynecology

## 2012-01-16 DIAGNOSIS — Z01419 Encounter for gynecological examination (general) (routine) without abnormal findings: Secondary | ICD-10-CM | POA: Insufficient documentation

## 2012-01-17 ENCOUNTER — Telehealth: Payer: Self-pay | Admitting: *Deleted

## 2012-01-17 NOTE — Telephone Encounter (Signed)
Left message that I was returning a call concerning Latasha Parker. She or her mother may call back to the nurse voice mail. Calls received prior to 4pm today will be answered.

## 2012-01-17 NOTE — Telephone Encounter (Signed)
Call received from pt's mother Vira Blanco because pt is deaf. She stated that Flannery is having a lot of pain from the cysts on her ovary. Please call back

## 2012-01-19 NOTE — Telephone Encounter (Signed)
Called Care Management to obtain resources to contact patient - Given [employer]. Called patient home number with [employer](dial [phone])operator# [phone] and heard message customer is not taking calls right now, call later.  Will call at another time with either [employer](dial [phone])  or Cap(dial [phone])

## 2012-01-19 NOTE — Telephone Encounter (Signed)
Called patient home number with [employer] interpreter [phone], heard message person is not receiving calls, unable to leave message. Called patient mother and discussed with her that I understand she called for her daughter and that her daughter is deaf and I have been trying to reach her unsuccessfully.  She states Ronasia has ran out of minutes on her phone. Asked her if she could get in contact with her and she states she  will see her later tonight. Gave her an appointment for 10:30 tomorrow- she will let patient know and if she can't come will let us know tomorrow.

## 2012-01-20 ENCOUNTER — Encounter: Payer: Self-pay | Admitting: Obstetrics & Gynecology

## 2012-01-20 ENCOUNTER — Ambulatory Visit (INDEPENDENT_AMBULATORY_CARE_PROVIDER_SITE_OTHER): Payer: Medicaid Other | Admitting: Obstetrics & Gynecology

## 2012-01-20 VITALS — BP 112/70 | HR 79 | Temp 97.2°F | Resp 12 | Ht 69.0 in | Wt 160.9 lb

## 2012-01-20 DIAGNOSIS — N83209 Unspecified ovarian cyst, unspecified side: Secondary | ICD-10-CM

## 2012-01-20 DIAGNOSIS — M549 Dorsalgia, unspecified: Secondary | ICD-10-CM

## 2012-01-20 MED ORDER — TRAMADOL HCL 50 MG PO TABS
50.0000 mg | ORAL_TABLET | Freq: Four times a day (QID) | ORAL | Status: AC | PRN
Start: 2012-01-20 — End: 2012-01-30

## 2012-01-20 MED ORDER — TRAMADOL HCL 50 MG PO TABS: 50.0000 mg | ORAL_TABLET | Freq: Four times a day (QID) | ORAL | Status: DC | PRN

## 2012-01-20 NOTE — Progress Notes (Signed)
  [facility]  Patient name: DELORICE BANNISTER MRN [id number]  Date of birth: 17-Dec-1975  CC & HPI:  DINNA SEVERS is a 36 y.o. female presenting today for follow up of dysmenorrhea associated with ovarian cysts.  States diclofenac is irritating her stomach. Nausea but no vomiting.  No melana or hematachezia.  Started period yesterday.  Started OCPs  ROS:  Per HPI  Pertinent History Reviewed:  Medications: Reviewed & Updated - see associated section Social History: Reviewed - Significant for continues to smoke  Objective Findings:  Vitals:  Filed Vitals:   01/20/12 1055  BP: 112/70  Pulse: 79  Temp: 97.2 F (36.2 C)  Resp: 12    PE: GENERAL:  Adult caucasian deaf female. In no discomfort; no respiratory distress. PSYCH: Alert and appropriately interactive; Insight:Good     Assessment & Plan:  Dysmenorrhea.  Will rx tramadol given NSAID intolerance. Pt to follow up with [place] as previously arranged. On OCPs.  Will need to readdress after [place].  Pt is 36 yo who smokes.  Pt requesting hysterectomy but is willing to try pharmacologic approach.  Trying to see orthopedists for her back pain Andrena Mews, DO [facility] Resident - PGY-2 01/20/2012 11:27 AM   Agree with the resident's note, discussed with patient   ARNOLD,JAMES 01/20/2012

## 2012-01-20 NOTE — Patient Instructions (Addendum)
I have sent in a prescription for a pain medicine to your pharmacy.   Please keep your ultrasound appointment

## 2012-02-10 ENCOUNTER — Other Ambulatory Visit: Payer: Self-pay | Admitting: Obstetrics & Gynecology

## 2012-02-10 ENCOUNTER — Ambulatory Visit (HOSPITAL_COMMUNITY)
Admission: RE | Admit: 2012-02-10 | Discharge: 2012-02-10 | Disposition: A | Discharge status: Home / Self Care | Payer: Medicaid Other | Source: Ambulatory Visit | Attending: Obstetrics & Gynecology | Admitting: Obstetrics & Gynecology

## 2012-02-10 DIAGNOSIS — N83209 Unspecified ovarian cyst, unspecified side: Secondary | ICD-10-CM

## 2012-02-10 DIAGNOSIS — R1032 Left lower quadrant pain: Secondary | ICD-10-CM | POA: Insufficient documentation

## 2012-02-15 ENCOUNTER — Telehealth: Payer: Self-pay | Admitting: Medical

## 2012-02-15 NOTE — Telephone Encounter (Signed)
Patient called requesting [place] results from last Friday.

## 2012-02-15 NOTE — Telephone Encounter (Signed)
Called pt and informed her of [place] result from 02/10/12.  Pt voiced understanding and will keep appt as scheduled on 02/29/12 @ 1300.

## 2012-02-28 ENCOUNTER — Emergency Department (HOSPITAL_COMMUNITY): Payer: Medicaid Other

## 2012-02-28 ENCOUNTER — Encounter (HOSPITAL_COMMUNITY): Payer: Self-pay | Admitting: Physical Medicine and Rehabilitation

## 2012-02-28 ENCOUNTER — Emergency Department (HOSPITAL_COMMUNITY)
Admission: EM | Admit: 2012-02-28 | Discharge: 2012-02-28 | Disposition: A | Discharge status: Home / Self Care | Payer: Medicaid Other | Attending: Emergency Medicine | Admitting: Emergency Medicine

## 2012-02-28 DIAGNOSIS — F17200 Nicotine dependence, unspecified, uncomplicated: Secondary | ICD-10-CM | POA: Insufficient documentation

## 2012-02-28 DIAGNOSIS — M549 Dorsalgia, unspecified: Secondary | ICD-10-CM | POA: Insufficient documentation

## 2012-02-28 DIAGNOSIS — Z809 Family history of malignant neoplasm, unspecified: Secondary | ICD-10-CM | POA: Insufficient documentation

## 2012-02-28 DIAGNOSIS — M4180 Other forms of scoliosis, site unspecified: Secondary | ICD-10-CM | POA: Insufficient documentation

## 2012-02-28 DIAGNOSIS — H913 Deaf nonspeaking, not elsewhere classified: Secondary | ICD-10-CM | POA: Insufficient documentation

## 2012-02-28 LAB — URINALYSIS, ROUTINE W REFLEX MICROSCOPIC
Bilirubin Urine: NEGATIVE
Nitrite: NEGATIVE
Specific Gravity, Urine: 1.015 (ref 1.005–1.030)
pH: 6.5 (ref 5.0–8.0)

## 2012-02-28 LAB — URINE MICROSCOPIC-ADD ON

## 2012-02-28 LAB — POCT PREGNANCY, URINE: Preg Test, Ur: NEGATIVE

## 2012-02-28 MED ORDER — HYDROCODONE-ACETAMINOPHEN 5-325 MG PO TABS: 2.0000 | ORAL_TABLET | ORAL | Status: DC | PRN

## 2012-02-28 MED ORDER — NAPROXEN 250 MG PO TABS
500.0000 mg | ORAL_TABLET | Freq: Once | ORAL | Status: AC
  Administered 2012-02-28: 500 mg via ORAL
  Filled 2012-02-28: qty 2

## 2012-02-28 MED ORDER — NAPROXEN 500 MG PO TABS: 500.0000 mg | ORAL_TABLET | Freq: Two times a day (BID) | ORAL | Status: DC

## 2012-02-28 NOTE — ED Notes (Signed)
Pt presents to department for evaluation of LLQ abdominal pain that radiates around to back and down L leg. Ongoing for several days. 9/10 pain at the time, describes as pressure sensation. Denies urinary symptoms. Pt states heavy menstrual cycle lately. She is alert and oriented x4. No signs of distress at the time.

## 2012-02-28 NOTE — ED Provider Notes (Signed)
History is obtained via medical interpreter using sign language. Patient hearing impaired Complains of low back pain radiating to left knee onset several months ago worse with movement patient also complains of left lower abdominal pain for several months which she says is due to ovarian cysts. She's been treated herself with Aleve, without relief. Eye exam alert Glasgow Coma Score 15 moves all extremities well gait normal  Doug Sou, MD 02/28/12 1759

## 2012-02-28 NOTE — ED Provider Notes (Signed)
History     CSN: [id number]  Arrival date & time 02/28/12  1209   First MD Initiated Contact with Patient 02/28/12 1613      Chief Complaint  Patient presents with  . Abdominal Pain    HPI  36 year old female with past medical history of ovarian cyst, menorrhagia, followed by Dr. Scheryl Darter. Patient uses [place] sign language to communicate. Presents with 5 years history of lower back pain that has progressively gotten worse. She describes a tingling sensation that travels down the back of her left leg. The pain worsened with walking improves when leaning over. Denies any bladder or bowel incontinence. She denies any trauma to her back denies any fevers. She further denies any headache any nausea vomiting or diarrhea. She states she continues to have issues with her menstrual cycle. But has a followup appointment with OB/GYN on October 18. She states that she would not like to address her menstrual cycle during this visit.    Past Medical History  Diagnosis Date  . Deaf     Past Surgical History  Procedure Date  . Ovary tumor     cyst  . Foot surgery     Family History  Problem Relation Age of Onset  . Cancer Maternal Grandmother     History  Substance Use Topics  . Smoking status: Current Every Day Smoker -- 1.0 packs/day  . Smokeless tobacco: Never Used  . Alcohol Use: No    OB History    Grav Para Term Preterm Abortions TAB SAB Ect Mult Living   2 2 2       2       Review of Systems  Constitutional: Negative for fever, chills, activity change and appetite change.  HENT: Negative for ear pain, congestion, rhinorrhea and neck pain.   Eyes: Negative for pain.  Respiratory: Negative for cough and shortness of breath.   Cardiovascular: Negative for chest pain and palpitations.  Gastrointestinal: Negative for nausea, vomiting and abdominal pain.  Genitourinary: Positive for menstrual problem. Negative for dysuria, difficulty urinating and pelvic pain.    Musculoskeletal: Positive for back pain.  Skin: Negative for rash and wound.  Neurological: Negative for weakness and headaches.  Psychiatric/Behavioral: Negative for behavioral problems, confusion and agitation.    Allergies  Review of patient's allergies indicates no known allergies.  Home Medications   Current Outpatient Rx  Name Route Sig Dispense Refill  . NORGESTIM-ETH ESTRAD TRIPHASIC 0.18/0.215/0.25 MG-25 MCG PO TABS Oral Take 1 tablet by mouth daily. 1 Package 11    BP 120/80  Pulse 69  Temp 98.2 F (36.8 C)  Resp 16  SpO2 98%  LMP 02/21/2012  Physical Exam  Constitutional: She is oriented to person, place, and time. She appears well-developed and well-nourished. No distress.  HENT:  Head: Normocephalic and atraumatic.  Nose: Nose normal.  Mouth/Throat: Oropharynx is clear and moist.  Eyes: EOM are normal. Pupils are equal, round, and reactive to light.  Neck: Normal range of motion. Neck supple. No tracheal deviation present.  Cardiovascular: Normal rate, regular rhythm, normal heart sounds and intact distal pulses.   Pulmonary/Chest: Effort normal and breath sounds normal. She has no rales.  Abdominal: Soft. Bowel sounds are normal. She exhibits no distension. There is no tenderness. There is no rebound and no guarding.  Genitourinary:       Deferred  Musculoskeletal: Normal range of motion. She exhibits tenderness.       Patient able to flex and extend at the  waist. Point tenderness over the mid lumbar spine. Reproduces the describe pain. Patient able to ambulate with normal gait.  Neurological: She is alert and oriented to person, place, and time.       Normal rectal tone. Sensation intact to light touch to the lower trace bilaterally. Strength is 5 out of 5 with extension and flexion at the knee and ankle bilaterally. 2+ reflexes at the patella and Achilles bilaterally  Skin: Skin is warm and dry. No rash noted.  Psychiatric: She has a normal mood and affect.  Her behavior is normal.    ED Course  Procedures     Results for orders placed during the hospital encounter of 02/28/12  URINALYSIS, ROUTINE W REFLEX MICROSCOPIC      Component Value Range   Color, Urine YELLOW  YELLOW   APPearance HAZY (*) CLEAR   Specific Gravity, Urine 1.015  1.005 - 1.030   pH 6.5  5.0 - 8.0   Glucose, UA NEGATIVE  NEGATIVE mg/dL   Hgb urine dipstick SMALL (*) NEGATIVE   Bilirubin Urine NEGATIVE  NEGATIVE   Ketones, ur NEGATIVE  NEGATIVE mg/dL   Protein, ur NEGATIVE  NEGATIVE mg/dL   Urobilinogen, UA 0.2  0.0 - 1.0 mg/dL   Nitrite NEGATIVE  NEGATIVE   Leukocytes, UA SMALL (*) NEGATIVE  POCT PREGNANCY, URINE      Component Value Range   Preg Test, Ur NEGATIVE  NEGATIVE  URINE MICROSCOPIC-ADD ON      Component Value Range   Squamous Epithelial / LPF MANY (*) RARE   WBC, UA 0-2  <3 WBC/hpf   RBC / HPF 3-6  <3 RBC/hpf   Bacteria, UA FEW (*) RARE      DG Lumbar Spine Complete (Final result)   Result time:02/28/12 1458    Final result by Rad Results In Interface (02/28/12 14:58:36)    Narrative:   *RADIOLOGY REPORT*  Clinical Data: Pain down left leg and lower back for past 5 years worse over past year.  LUMBAR SPINE - COMPLETE 4+ VIEW  Comparison: 04/07/2009.  Findings: Mild levoscoliosis lumbar spine.  Minimal anterior osteophyte T11-12 and T12-L1 unchanged.  No significant lumbar spine disc space narrowing.  IMPRESSION: Mild levoscoliosis lumbar spine.  Minimal anterior osteophyte T11-12 and T12-L1 unchanged.  No significant lumbar spine disc space narrowing.   Original Report Authenticated By: Fuller Canada, M.D.      1. Back pain   2. Levoscoliosis       MDM   [age]-year-old female in no acute distress afebrile vital signs stable, nontoxic appearing. Presents with 5 years history of low back pain. She feels that this back pain has been worsening over the last couple weeks. Pain radiates down her posterior left leg. Feels  like a tingling sensation. Neurologic exam nonfocal. Rectal tone normal. No urinary or bladder incontinence. Doubt Spinal cord Compression syndrome.  Mild scoliosis on x-ray. While tenderness to palpation lower lumbar spine. Patient able to ambulate. There were discussed with the patient on findings and return precautions. Will follow up with primary care provider and consider outpatient spine consult.  New Prescriptions   HYDROCODONE-ACETAMINOPHEN (NORCO/VICODIN) 5-325 MG PER TABLET    Take 2 tablets by mouth every 4 (four) hours as needed for pain (severe pain).   NAPROXEN (NAPROSYN) 500 MG TABLET    Take 1 tablet (500 mg total) by mouth 2 (two) times daily with a meal.           Nadara Mustard,  MD 02/29/12 0013

## 2012-02-29 ENCOUNTER — Ambulatory Visit (INDEPENDENT_AMBULATORY_CARE_PROVIDER_SITE_OTHER): Payer: Medicaid Other | Admitting: Obstetrics and Gynecology

## 2012-02-29 VITALS — BP 134/77 | HR 81 | Temp 97.7°F | Wt 170.4 lb

## 2012-02-29 DIAGNOSIS — N83209 Unspecified ovarian cyst, unspecified side: Secondary | ICD-10-CM

## 2012-02-29 DIAGNOSIS — N83202 Unspecified ovarian cyst, left side: Secondary | ICD-10-CM

## 2012-02-29 DIAGNOSIS — N946 Dysmenorrhea, unspecified: Secondary | ICD-10-CM

## 2012-02-29 MED ORDER — HYDROCODONE-ACETAMINOPHEN 5-325 MG PO TABS: 2.0000 | ORAL_TABLET | ORAL | Status: DC | PRN

## 2012-02-29 MED ORDER — HYDROCODONE-ACETAMINOPHEN 5-500 MG PO TABS: 1.0000 | ORAL_TABLET | Freq: Four times a day (QID) | ORAL | Status: DC | PRN

## 2012-02-29 NOTE — Patient Instructions (Signed)
Contraceptive Injection Information Contraceptive injections protect against pregnancy. Progesterone-only injections are given every 3 months to prevent pregnancy. These injections contain synthetic progesterone hormone. This synthetic progesterone hormone stops the ovaries from releasing eggs. It also thickens cervical mucus and changes the uterine lining. Your caregiver will make sure you are a good candidate for contraceptive injections. Discuss the possible side effects of the injection with your caregiver. ADVANTAGES  They are highly effective and reversible.  They slow down the flow of heavy menstrual periods.  They control premenstrual syndrome (PMS).  They control cramps and painful menstrual periods.  They control irregular menstrual periods.  They are effective in preventing pregnancy when used correctly.  You are always protected when you get the injection on time. DISADVANTAGES  They can be associated with weight gain.  They do not protect against sexually transmitted diseases (STDs).  You must visit your caregiver every 3 months.  The injections may be uncomfortable.  They may cost more than other methods of birth control.  It can take up to 6 months to 2 years to be able to get pregnant (fertility). Document Released: 04/28/2011 Document Revised: 08/01/2011 Document Reviewed: 04/28/2011 ExitCare Patient Information 2013 [employer].  

## 2012-02-29 NOTE — Progress Notes (Signed)
CC: Results and Follow-up     HPI: Latasha Parker ia a 36 y.o. [id number] who is being followed for recurrent ovarian cysts, pelvic pain, dysmenorrhea. At last visit she was prescribed Ultraml for the pain but it made her nauseated so she only took it once and she did not like it. She went to [facility] ED since last here and got Vicodin which does help. Takes 1-2 /d. She again brings up wanting "hysterectomy" and ovaries out so reviewed that her uterus is normal, the cysts are physiologic and the risks of surgical menopause. Offered options of Mirena or Depo since she does not like the OCPs and is a smoker. She only took the OCPs for 1 cycle and had BTB, fatigue and other discomforts she attributed to the pill. LMP 02/21/12.  Not motivated to quit smoking. Deaf interpreter used.  Past Medical History  Diagnosis Date  . Deaf     OB History    Grav Para Term Preterm Abortions TAB SAB Ect Mult Living   2 2 2       2      # Outc Date GA Lbr Len/2nd Wgt Sex Del Anes PTL Lv   1 TRM 1/00    F SVD   Yes   2 TRM 10/02    F SVD   Yes      Past Surgical History  Procedure Date  . Ovary tumor     cyst  . Foot surgery     History   Social History  . Marital Status: Married    Spouse Name: N/A    Number of Children: N/A  . Years of Education: N/A   Occupational History  . Not on file.   Social History Main Topics  . Smoking status: Current Every Day Smoker -- 1.0 packs/day  . Smokeless tobacco: Never Used  . Alcohol Use: No  . Drug Use: No  . Sexually Active: Yes    Birth Control/ Protection: None   Other Topics Concern  . Not on file   Social History Narrative  . No narrative on file    Current Outpatient Prescriptions on File Prior to Visit  Medication Sig Dispense Refill  . HYDROcodone-acetaminophen (NORCO/VICODIN) 5-325 MG per tablet Take 2 tablets by mouth every 4 (four) hours as needed for pain (severe pain).  16 tablet  0  . naproxen (NAPROSYN) 500 MG tablet Take 1 tablet  (500 mg total) by mouth 2 (two) times daily with a meal.  30 tablet  0  . traMADol (ULTRAM) 50 MG tablet Take 50 mg by mouth every 6 (six) hours as needed. For pain       Current Facility-Administered Medications on File Prior to Visit  Medication Dose Route Frequency Provider Last Rate Last Dose  . naproxen (NAPROSYN) tablet 500 mg  500 mg Oral Once Nadara Mustard, MD   500 mg at 02/28/12 1736    Allergies  Allergen Reactions  . Tramadol Nausea And Vomiting    ROS: Pertinent items in HPI  PHYSICAL EXAM General: Well nourished, well developed female in no acute distress Cardiovascular: Normal rate Respiratory: Normal effort Abdomen: Soft, nontender Back: No CVAT Extremities: No edema Neurologic: Alert and oriented   IMAGING Dg Lumbar Spine Complete  02/28/2012  *RADIOLOGY REPORT*  Clinical Data: Pain down left leg and lower back for past 5 years worse over past year.  LUMBAR SPINE - COMPLETE 4+ VIEW  Comparison: 04/07/2009.  Findings: Mild levoscoliosis lumbar spine.  Minimal  anterior osteophyte T11-12 and T12-L1 unchanged.  No significant lumbar spine disc space narrowing.  IMPRESSION: Mild levoscoliosis lumbar spine.  Minimal anterior osteophyte T11-12 and T12-L1 unchanged.  No significant lumbar spine disc space narrowing.   Original Report Authenticated By: Fuller Canada, M.D.    US Transvaginal Non-ob  02/10/2012  *RADIOLOGY REPORT*  Clinical Data: Follow-up ovarian cyst.  Left lower quadrant pain. Recent course of of oral contraceptive pills.  TRANSABDOMINAL AND TRANSVAGINAL ULTRASOUND OF PELVIS Technique:  Both transabdominal and transvaginal ultrasound examinations of the pelvis were performed. Transabdominal technique was performed for global imaging of the pelvis including uterus, ovaries, adnexal regions, and pelvic cul-de-sac.  It was necessary to proceed with endovaginal exam following the transabdominal exam to visualize the ovaries.  Comparison:  12/21/2011  Findings:   Uterus: 10.2 x 4.7 x 7.1 cm.  Normal in appearance.  Endometrium: 10.4 mm, normal in appearance.  Right ovary:  4.7 x 3.4 x 4.0 cm.  New probable hemorrhagic cyst is 3.2 cm.  Left ovary: 5.6 x 4.4 x 4.9 cm.  Cyst is 4.1 x 4.3 x 5.0 cm, showing interval decrease in size and significant change in appearance, now appearing primarily anechoic but with central, thick septation.  Other findings: No free fluid  IMPRESSION:  1.  Normal-appearing uterus. 2.  Right ovary with probable corpus luteum cyst. 3.  Significant decrease size and change in appearance of left ovarian cyst, favoring a benign, physiologic process which is resolving.  I would recommend follow-up ultrasound in 6-8 weeks.   Original Report Authenticated By: Patterson Hammersmith, M.D.    US Pelvis Complete  02/10/2012  *RADIOLOGY REPORT*  Clinical Data: Follow-up ovarian cyst.  Left lower quadrant pain. Recent course of of oral contraceptive pills.  TRANSABDOMINAL AND TRANSVAGINAL ULTRASOUND OF PELVIS Technique:  Both transabdominal and transvaginal ultrasound examinations of the pelvis were performed. Transabdominal technique was performed for global imaging of the pelvis including uterus, ovaries, adnexal regions, and pelvic cul-de-sac.  It was necessary to proceed with endovaginal exam following the transabdominal exam to visualize the ovaries.  Comparison:  12/21/2011  Findings:  Uterus: 10.2 x 4.7 x 7.1 cm.  Normal in appearance.  Endometrium: 10.4 mm, normal in appearance.  Right ovary:  4.7 x 3.4 x 4.0 cm.  New probable hemorrhagic cyst is 3.2 cm.  Left ovary: 5.6 x 4.4 x 4.9 cm.  Cyst is 4.1 x 4.3 x 5.0 cm, showing interval decrease in size and significant change in appearance, now appearing primarily anechoic but with central, thick septation.  Other findings: No free fluid  IMPRESSION:  1.  Normal-appearing uterus. 2.  Right ovary with probable corpus luteum cyst. 3.  Significant decrease size and change in appearance of left ovarian cyst,  favoring a benign, physiologic process which is resolving.  I would recommend follow-up ultrasound in 6-8 weeks.   Original Report Authenticated By: Patterson Hammersmith, M.D.     ASSESSMENT Bilateral physiologic ovarian cysts Dysmenorrhea Smoker  PLAN  C/W Dr. Macon Large: Will refill Vicodin for breakthrough pain if Tylenol ineffective. Pt elects to read more about the Depo and call here if she decides to try that.  At next visit will consider F/U US (8 wk after previous scan) if her pain is not improving. See AVS for patient education    Danae Orleans, CNM 02/29/2012 1:57 PM

## 2012-02-29 NOTE — Progress Notes (Signed)
States has had 2 er visits, gave her tramadol, made her sick, went back to er, vicodin works better. States had her period in 4 times in September. States feels jittery at times. States passed a blood clot golf ball size.

## 2012-02-29 NOTE — ED Provider Notes (Signed)
I have personally seen and examined the patient.  I have discussed the plan of care with the resident.  I have reviewed the documentation on PMH/FH/Soc. History.  I have reviewed the documentation of the resident and agree.  Doug Sou, MD 02/29/12 [phone]

## 2012-03-30 ENCOUNTER — Other Ambulatory Visit: Payer: Self-pay | Admitting: Obstetrics and Gynecology

## 2012-04-01 NOTE — Telephone Encounter (Signed)
Decline refill of narcotic until seen again

## 2012-04-02 ENCOUNTER — Other Ambulatory Visit: Payer: Self-pay | Admitting: Obstetrics and Gynecology

## 2012-04-02 ENCOUNTER — Other Ambulatory Visit: Payer: Self-pay | Admitting: *Deleted

## 2012-04-02 NOTE — Telephone Encounter (Signed)
Per chart review pain med refill has been declined by Leola Brazil, CNm via her pharmacy request- states needs to have office visit before can get more pain med.

## 2012-04-02 NOTE — Telephone Encounter (Signed)
Latasha Parker called for [facility], stating she is wondering if she can get her pain med refilled.

## 2012-04-02 NOTE — Telephone Encounter (Signed)
Someone called again and left a message for Latasha Parker stating she needs to get pain med refill, is very uncomfortable.

## 2012-04-03 NOTE — Telephone Encounter (Signed)
Called pt and spoke with her mother Vira Blanco) as pt is deaf.  Pt was present and Debbie translated my message to her. I stated that she will need to be seen for evaluation and consideration of additional pain med Rx. I gave appt on 11/14 @ 1245. Pt agreed to appt and voiced understanding.

## 2012-04-05 ENCOUNTER — Ambulatory Visit (INDEPENDENT_AMBULATORY_CARE_PROVIDER_SITE_OTHER): Payer: Medicaid Other | Admitting: Obstetrics and Gynecology

## 2012-04-05 ENCOUNTER — Encounter: Payer: Self-pay | Admitting: Obstetrics and Gynecology

## 2012-04-05 VITALS — BP 113/69 | HR 101 | Temp 98.1°F | Resp 12 | Ht 68.0 in | Wt 166.3 lb

## 2012-04-05 DIAGNOSIS — N946 Dysmenorrhea, unspecified: Secondary | ICD-10-CM

## 2012-04-05 DIAGNOSIS — N83209 Unspecified ovarian cyst, unspecified side: Secondary | ICD-10-CM

## 2012-04-05 DIAGNOSIS — N920 Excessive and frequent menstruation with regular cycle: Secondary | ICD-10-CM

## 2012-04-05 DIAGNOSIS — N949 Unspecified condition associated with female genital organs and menstrual cycle: Secondary | ICD-10-CM

## 2012-04-05 DIAGNOSIS — R102 Pelvic and perineal pain: Secondary | ICD-10-CM

## 2012-04-05 DIAGNOSIS — Z01812 Encounter for preprocedural laboratory examination: Secondary | ICD-10-CM

## 2012-04-05 LAB — POCT PREGNANCY, URINE: Preg Test, Ur: NEGATIVE

## 2012-04-05 MED ORDER — HYDROCODONE-ACETAMINOPHEN 5-500 MG PO TABS: 1.0000 | ORAL_TABLET | Freq: Four times a day (QID) | ORAL | Status: DC | PRN

## 2012-04-05 MED ORDER — MEDROXYPROGESTERONE ACETATE 150 MG/ML IM SUSP
150.0000 mg | INTRAMUSCULAR | Status: DC
  Administered 2012-04-05: 150 mg via INTRAMUSCULAR

## 2012-04-05 NOTE — Progress Notes (Signed)
Patient ID: Latasha Parker, female   DOB: Oct 28, 1975, 36 y.o.   MRN: [id number] Patient presents today for follow-up on her pelvic pain and ovarian cyst. Patient reports persist pain LLQ>RLQ. Patient also reports heavy vaginal bleeding with passage of clot which started on 04/02/2012. Patient is ready to discuss medical management with depo-provera. Discussed with patient that this is a good option as it will aid in decreasing her menstrual flow and prevent ovulation.  Patient on her menses right now and declined a pelvic exam  A/P 36 yo G2P2 with recurrent ovarian cysts and menorrhagia - Will obtain pelvic ultrasound for resolution of cysts as patient still complaining of pain - Will start medical management with depo-provera. Patient advised to take at least 2 doses before stating that it is not managing her pain. - RTC in 6 months

## 2012-04-06 ENCOUNTER — Other Ambulatory Visit: Payer: Self-pay | Admitting: Obstetrics and Gynecology

## 2012-04-06 DIAGNOSIS — R102 Pelvic and perineal pain: Secondary | ICD-10-CM

## 2012-04-09 ENCOUNTER — Ambulatory Visit (HOSPITAL_COMMUNITY): Payer: Medicaid Other

## 2012-04-11 ENCOUNTER — Ambulatory Visit (HOSPITAL_COMMUNITY)
Admission: RE | Admit: 2012-04-11 | Discharge: 2012-04-11 | Disposition: A | Discharge status: Home / Self Care | Payer: Medicaid Other | Source: Ambulatory Visit | Attending: Obstetrics and Gynecology | Admitting: Obstetrics and Gynecology

## 2012-04-11 DIAGNOSIS — R102 Pelvic and perineal pain: Secondary | ICD-10-CM

## 2012-04-11 DIAGNOSIS — N83209 Unspecified ovarian cyst, unspecified side: Secondary | ICD-10-CM | POA: Insufficient documentation

## 2012-04-11 DIAGNOSIS — N949 Unspecified condition associated with female genital organs and menstrual cycle: Secondary | ICD-10-CM | POA: Insufficient documentation

## 2012-04-26 ENCOUNTER — Telehealth: Payer: Self-pay

## 2012-04-26 NOTE — Telephone Encounter (Signed)
Message copied by Verner Chol on Thu Apr 26, 2012  4:16 PM ------      Message from: Lelon Perla      Created: Wed Apr 25, 2012 11:30 AM      Contact: [phone]       I told her we do not take [place] access medicaid---that would be self pay.  [place] (does not have Dr on card) we can take      ----- Message -----         From: Marshell Garfinkel         Sent: 04/23/2012   4:42 PM           To: Lelon Perla, DO            Patient is deaf and has Medicaid w/no MD listed. [employer] (friend of pt) states she spoke w/you regarding this and you told her you would see the patient. I advised Ms. Schoonover that we could see her, but the patient would be a self-pay because we do not accept Medicaid. She states that is not what you told her. Please advise.       Leave msg for Kathlene November (boyfriend) and he will communicate w/patient. Erlene Quan [phone].

## 2012-04-26 NOTE — Telephone Encounter (Signed)
Called patient to advise on below, advised Latasha Parker, currently our practice is not accepting [place] access and patient would have to pay up front at every office visit $145.00 Latasha Parker advised she could not afford that. I referred her to the [facility] on [address] as they offer services for patients with limited financial means.

## 2012-04-30 ENCOUNTER — Other Ambulatory Visit: Payer: Self-pay | Admitting: Obstetrics & Gynecology

## 2012-04-30 ENCOUNTER — Telehealth: Payer: Self-pay | Admitting: *Deleted

## 2012-04-30 MED ORDER — HYDROCODONE-ACETAMINOPHEN 5-500 MG PO TABS: 1.0000 | ORAL_TABLET | Freq: Four times a day (QID) | ORAL | Status: DC | PRN

## 2012-04-30 NOTE — Telephone Encounter (Signed)
Patient is deaf and presented to window stating she had no one to call for her. States she needs a refill on her vicodin for persistent rlq pain=7 and also wants results on her last pelvic ultrasound. Per chart review no pending appointments . Per chart review shows  last visit 04/05/12 saw Dr. Jolayne Panther- had depoprovera and refill of Vicodin 30 tabs, with plan for pelvic ultrasound and continue depoprovera.  Per results shows persisitent ovarian cyst- possible dermoid. Consulted Dr. Penne Lash and will give patient refill of Vicodin today , make appointment for possible surgical workup with Dr. Jolayne Panther ,and appointment for 2nd depoprovera if patient hasn't had surgery then.  Will send note to Dr. Jolayne Panther asking is she desires patient to have MRI before appt.  Explained to patient and she voices understanding.

## 2012-04-30 NOTE — Progress Notes (Signed)
NST reactive x2. Pt set up for induction in 12 hours. Dr. Pratt will do vaginal breech of 2nd twin. Pt to arrive for pitocin at 5 a.m.  

## 2012-04-30 NOTE — Telephone Encounter (Signed)
No need for MRI

## 2012-05-24 ENCOUNTER — Ambulatory Visit (INDEPENDENT_AMBULATORY_CARE_PROVIDER_SITE_OTHER): Payer: Medicaid Other | Admitting: Obstetrics and Gynecology

## 2012-05-24 ENCOUNTER — Encounter: Payer: Self-pay | Admitting: Obstetrics and Gynecology

## 2012-05-24 VITALS — BP 114/79 | HR 88 | Temp 97.8°F | Ht 68.0 in | Wt 166.1 lb

## 2012-05-24 DIAGNOSIS — N83209 Unspecified ovarian cyst, unspecified side: Secondary | ICD-10-CM

## 2012-05-24 DIAGNOSIS — N83202 Unspecified ovarian cyst, left side: Secondary | ICD-10-CM

## 2012-05-24 MED ORDER — HYDROCODONE-ACETAMINOPHEN 5-325 MG PO TABS: 1.0000 | ORAL_TABLET | ORAL | Status: DC | PRN

## 2012-05-24 MED ORDER — IBUPROFEN 800 MG PO TABS: 600.0000 mg | ORAL_TABLET | Freq: Four times a day (QID) | ORAL | Status: DC | PRN

## 2012-05-24 MED ORDER — IBUPROFEN 800 MG PO TABS: 800.0000 mg | ORAL_TABLET | Freq: Three times a day (TID) | ORAL | Status: DC | PRN

## 2012-05-24 NOTE — Progress Notes (Signed)
Patient ID: Latasha Parker, female   DOB: [birth year], 38 y.o.   MRN: [id number] Patient presents today for follow up on ultrasound which demonstrates persistent left 5 cm ovarian cyst with sonographic appearance consistent with dermoid. Patient reports pain on that side on occasions. Discussed the need for surgical removal. Patient has had a cyst removed on that side in the past laparoscopically. Discussed performing this procedure laparoscopically with the possibility of converting to mini laparotomy if too much scar tissue is encountered. Risks, benefits and alternatives were explained, including but not limited to risks of bleeding, infection and damage to adjacent organs. Patient verbalized understanding and all questions were answered. Patient will be contacted by surgical scheduler.

## 2012-05-30 ENCOUNTER — Ambulatory Visit (INDEPENDENT_AMBULATORY_CARE_PROVIDER_SITE_OTHER): Payer: Medicaid Other | Admitting: *Deleted

## 2012-05-30 DIAGNOSIS — N949 Unspecified condition associated with female genital organs and menstrual cycle: Secondary | ICD-10-CM

## 2012-05-30 DIAGNOSIS — N938 Other specified abnormal uterine and vaginal bleeding: Secondary | ICD-10-CM

## 2012-05-30 DIAGNOSIS — R102 Pelvic and perineal pain: Secondary | ICD-10-CM

## 2012-05-30 DIAGNOSIS — N946 Dysmenorrhea, unspecified: Secondary | ICD-10-CM

## 2012-05-30 MED ORDER — HYDROCODONE-ACETAMINOPHEN 5-325 MG PO TABS: 1.0000 | ORAL_TABLET | ORAL | Status: DC | PRN

## 2012-05-30 MED ORDER — MEDROXYPROGESTERONE ACETATE 150 MG/ML IM SUSP
150.0000 mg | Freq: Once | INTRAMUSCULAR | Status: AC
  Administered 2012-05-30: 150 mg via INTRAMUSCULAR

## 2012-05-30 NOTE — Progress Notes (Signed)
Lynann showed up at window asking to speak with a nurse. Patient is hearing impaired and is difficult for her to call first.  Darnice states she is having a lot of pain and needs a refill on her pain med- Vicodin, states she also started her period and is bleeding a lot- changes pad 4-5 times a day.  States she has to use Vicodin 2 tabs at at time because 1 is not enough.  Also states she is using the ibuprofen , but it hurts her stomach sometimes. Also requests to get depoprovera early- due Jan 30- because it helps with pain.Also states she is having surgery but hasn't gotten a date yet.  Informed patient I would need to talk with doctor. Called Dr. Jolayne Panther who is familiar with patient and saw her last week and discussed patient's complaints and requests. Dr.Constant gave orders patient may have depoprovera early and may have one more refill of 40 Vicodin to last until surgery and also gave me date of patient surgery to give patient.Jovita Gamma patient depoprovera and sent refill of  Vicodin to pharmacy. Explained to patient she must make this last until surgery and try to use methods that helped her before. Also encouraged her to take Ibuprofen with food to help with stomach issues. Gave patient surgery date and informed her she will still get a letter with information and a date for preop evaluation. Patient voices understanding.

## 2012-06-11 ENCOUNTER — Telehealth: Payer: Self-pay | Admitting: *Deleted

## 2012-06-11 DIAGNOSIS — N938 Other specified abnormal uterine and vaginal bleeding: Secondary | ICD-10-CM

## 2012-06-11 DIAGNOSIS — R102 Pelvic and perineal pain: Secondary | ICD-10-CM

## 2012-06-11 MED ORDER — MEDROXYPROGESTERONE ACETATE 10 MG PO TABS: 10.0000 mg | ORAL_TABLET | Freq: Every day | ORAL | Status: DC

## 2012-06-11 MED ORDER — HYDROCODONE-ACETAMINOPHEN 5-325 MG PO TABS: 1.0000 | ORAL_TABLET | ORAL | Status: DC | PRN

## 2012-06-11 NOTE — Telephone Encounter (Signed)
Will send to Dr. Jolayne Panther for review and call her .

## 2012-06-11 NOTE — Telephone Encounter (Signed)
Received a call from [facility] via  Relay [phone] and call was disconnected before we discussed her complaints. Then received a second call from [facility] via Relay [phone] stating Latasha Parker is c/o still bleeding red blood since 05/25/12. , and clots and hurting and out of Vicodin and Ibuprofen- requests one more refill .  Discussed with Latasha Parker her pain level is 6 or 7, states changing pads 5-7 times a day, c/o feeling tired but denies dizziness. Conversation difficult with interpreters. Unable to clarify how big the clots are. Discussed with Usha previously Dr. Had  said no more refills. Discussed if she is having that much pain, and that she has used all her vicodin and ibuprofen , and is still bleeding and feeling weak she should come to the hospital MAU to be evaluated. Latasha Parker states she can't come to the hospital today , she has her daughter , states she can come to the hospital Friday if she is not better. Latasha Parker states she just wants one more refill today and if she isnt better by Friday she will come to the hospital.. Instructed her if in the meantime she feels dizzy or bleeding heavier she must come to MAU. Patient voices understanding.  Before end of call call was disconnected and a third Relay operator [phone] called back for patient. Patient gave consent we can call her mother anytime we need to reach her and her mom will text her.  Informed patient we will call doctor re: refill and call her mom.

## 2012-06-11 NOTE — Telephone Encounter (Signed)
Called Dr. Jolayne Panther and got orders for 1 refill of Vicodin 30 tabs and order to start Provera 1 tab daily- called Azell Der Mom and explained orders to her and she will text Rayla. Also asked her mom to again stress to [facility] if her bleeding gets heavier or she feels dizzy she must come to the hospial MAU for evaluation. Debbie voiced understanding.

## 2012-06-11 NOTE — Telephone Encounter (Signed)
Received a call from Financial planner

## 2012-06-11 NOTE — Telephone Encounter (Signed)
Received a call from Vira Blanco, who identifies herself as Lucille Passy mom and states she is calling for her. Tavia is known to clinic, she is deaf and does not have a tty or computer to call clinic thru [facility].  Eunice Blase states she was there to see Marianna this weekend and Payge has been bleeding since 05/25/12 and is having a really hard time and is in substantial pain and passing blood clots and is very uncomfortable.  Requests a call to let her what can be done including medine?  Called Debbie and she states she is not with Rachele that she is in [place], [place] with another family member who needs her help.  States Telesforo Brosnahan is home in [place]. We discussed that per Hippa I can not discuss Cinde without Mirel's consent to [place] and I cannot find that on chart ( found note can give appointments to her boyfriend)-Sonda and Debbie communicate thru texting.  Asked her to tell Abrina to call us thru relay if possible or come to clinic during office hours as I see no other alternative and I need to talk to her about her symptoms.  Eunice Blase will text Marliyah.  Per chart review Chazlyn is scheduled for removal left ovarian cyst 07/17/12 ( probable dermoid)and was last given 40 Vicodin on 05/30/12 when she came to the window complaining of needing more vicodin, bleeding enough to change pads 4-5 times a day, was given her depoprovera early and told to make the 40 Vicodin last until surgery and to use Ibuprofen.

## 2012-06-21 ENCOUNTER — Ambulatory Visit: Payer: Medicaid Other

## 2012-07-06 ENCOUNTER — Encounter (HOSPITAL_COMMUNITY): Payer: Self-pay | Admitting: Pharmacist

## 2012-07-11 NOTE — Patient Instructions (Addendum)
    Your procedure is scheduled on:  Tuesday, Feb 25  Enter through the [employer] of [facility] at: 945 am Pick up the phone at the desk and dial [phone] and inform us of your arrival.  Please call this number if you have any problems the morning of surgery: [phone]  Remember: Do not eat food after midnight: Monday Do not drink clear liquids after: midnight Monday Take these medicines the morning of surgery with a SIP OF WATER: none  Do not wear jewelry, make-up, or FINGER nail polish No metal in your hair or on your body. Do not wear lotions, powders, perfumes. You may wear deodorant.  Please use your CHG wash as directed prior to surgery.  Do not shave anywhere for at least 12 hours prior to first CHG shower.  Do not bring valuables to the hospital. Contacts, dentures or bridgework may not be worn into surgery.  Patients discharged on the day of surgery will not be allowed to drive home.  Home with mother or boyfriend.

## 2012-07-12 ENCOUNTER — Encounter (HOSPITAL_COMMUNITY)
Admission: RE | Admit: 2012-07-12 | Discharge: 2012-07-12 | Disposition: A | Discharge status: Home / Self Care | Payer: Medicaid Other | Source: Ambulatory Visit | Attending: Obstetrics and Gynecology | Admitting: Obstetrics and Gynecology

## 2012-07-12 ENCOUNTER — Encounter (HOSPITAL_COMMUNITY): Payer: Self-pay

## 2012-07-12 HISTORY — DX: Thyrotoxicosis, unspecified without thyrotoxic crisis or storm: E05.90

## 2012-07-12 LAB — CBC
HCT: 39.6 % (ref 36.0–46.0)
MCH: 31.2 pg (ref 26.0–34.0)
MCHC: 33.6 g/dL (ref 30.0–36.0)
MCV: 93 fL (ref 78.0–100.0)
RDW: 14.6 % (ref 11.5–15.5)

## 2012-07-12 NOTE — Progress Notes (Signed)
Hans Eden, communication services for deaf help with translation in PAT appt.

## 2012-07-16 NOTE — H&P (Signed)
Latasha Parker is an 37 y.o. female G2P2 with persistent pelvic pain and left ovarian cyst presenting today for scheduled laparoscopic ovarian cystectomy. Patient had ultrasounds with findings consistent with a dermoid cyst.  Pertinent Gynecological History: Menses: flow is moderate Bleeding: monthly Contraception: Depo-Provera injections DES exposure: denies Blood transfusions: none Sexually transmitted diseases: no past history Previous GYN Procedures: none  Last mammogram: n/a  Last pap: normal Date: 12/2011 OB History: G2, P2   Menstrual History:  Patient's last menstrual period was 06/27/2012.    Past Medical History  Diagnosis Date  . Deaf   . SVD (spontaneous vaginal delivery)   . Hyperthyroidism     Past Surgical History  Procedure Laterality Date  . Ovary tumor      cyst  . Foot surgery    . Diagnostic laparoscopy  2003    ovarian cyst removed  . Wisdom tooth extraction      Family History  Problem Relation Age of Onset  . Cancer Maternal Grandmother     Social History:  reports that she has been smoking Cigarettes.  She has a 15 pack-year smoking history. She has never used smokeless tobacco. She reports that she does not drink alcohol or use illicit drugs.  Allergies:  Allergies  Allergen Reactions  . Tramadol Nausea And Vomiting    Prescriptions prior to admission  Medication Sig Dispense Refill  . HYDROcodone-acetaminophen (NORCO/VICODIN) 5-325 MG per tablet Take 1 tablet by mouth every 4 (four) hours as needed for pain (severe pain).  30 tablet  0  . ibuprofen (ADVIL,MOTRIN) 800 MG tablet Take 1 tablet (800 mg total) by mouth every 8 (eight) hours as needed for pain.  30 tablet  0  . medroxyPROGESTERone (PROVERA) 10 MG tablet Take 1 tablet (10 mg total) by mouth daily.  30 tablet  1    Review of Systems  All other systems reviewed and are negative.    Blood pressure 106/60, pulse 70, temperature 98.2 F (36.8 C), temperature source Oral,  resp. rate 16, height 5\' 8"  (1.727 m), weight 160 lb (72.576 kg), last menstrual period 06/27/2012, SpO2 97.00%. Physical Exam GENERAL: Well-developed, well-nourished female in no acute distress.  HEENT: Normocephalic, atraumatic. Sclerae anicteric.  NECK: Supple. Normal thyroid.  LUNGS: Clear to auscultation bilaterally.  HEART: Regular rate and rhythm. BREASTS: Symmetric in size. No palpable masses or lymphadenopathy, skin changes, or nipple drainage. ABDOMEN: Soft, nontender, nondistended. No organomegaly. PELVIC: deferred to OR EXTREMITIES: No cyanosis, clubbing, or edema, 2+ distal pulses.  03/2012 [place]: IMPRESSION:  1. Persistent 5 cm left ovarian complex cystic lesion containing a  hyperechoic nodule, highly suspicious for ovarian dermoid.  Consider surgical evaluation and pelvic MRI for preoperative  confirmation.  2. Normal appearance of the uterus and right ovary.  Assessment/Plan: 37 yo G2P2 with persistent left ovarian cyst with sonographic appearance consistent with a dermoid cyst - Patient scheduled for ovarian cystectomy via laparoscopy. Risks, benefits and alternatives were explained including but not limited to risks of bleeding, infection and damage to adjacent organs. Patient verbalized understanding and all questions were answered.  [name] 07/17/2012, 10:52 AM

## 2012-07-17 ENCOUNTER — Encounter (HOSPITAL_COMMUNITY): Payer: Self-pay | Admitting: Anesthesiology

## 2012-07-17 ENCOUNTER — Encounter (HOSPITAL_COMMUNITY)
Admission: RE | Disposition: A | Discharge status: Home / Self Care | Payer: Self-pay | Source: Ambulatory Visit | Attending: Obstetrics and Gynecology

## 2012-07-17 ENCOUNTER — Ambulatory Visit (HOSPITAL_COMMUNITY)
Admission: RE | Admit: 2012-07-17 | Discharge: 2012-07-17 | Disposition: A | Discharge status: Home / Self Care | Payer: Medicaid Other | Source: Ambulatory Visit | Attending: Obstetrics and Gynecology | Admitting: Obstetrics and Gynecology

## 2012-07-17 ENCOUNTER — Ambulatory Visit (HOSPITAL_COMMUNITY): Payer: Medicaid Other | Admitting: Anesthesiology

## 2012-07-17 DIAGNOSIS — N946 Dysmenorrhea, unspecified: Secondary | ICD-10-CM

## 2012-07-17 DIAGNOSIS — N938 Other specified abnormal uterine and vaginal bleeding: Secondary | ICD-10-CM

## 2012-07-17 DIAGNOSIS — N803 Endometriosis of pelvic peritoneum, unspecified: Secondary | ICD-10-CM | POA: Insufficient documentation

## 2012-07-17 DIAGNOSIS — R102 Pelvic and perineal pain: Secondary | ICD-10-CM

## 2012-07-17 DIAGNOSIS — D279 Benign neoplasm of unspecified ovary: Secondary | ICD-10-CM | POA: Insufficient documentation

## 2012-07-17 DIAGNOSIS — N83209 Unspecified ovarian cyst, unspecified side: Secondary | ICD-10-CM

## 2012-07-17 HISTORY — PX: LAPAROSCOPY: SHX197

## 2012-07-17 LAB — PREGNANCY, URINE: Preg Test, Ur: NEGATIVE

## 2012-07-17 SURGERY — LAPAROSCOPY OPERATIVE
Anesthesia: General | Wound class: Clean Contaminated

## 2012-07-17 MED ORDER — FENTANYL CITRATE 0.05 MG/ML IJ SOLN
INTRAMUSCULAR | Status: AC
  Filled 2012-07-17: qty 2

## 2012-07-17 MED ORDER — DOCUSATE SODIUM 100 MG PO CAPS: 100.0000 mg | ORAL_CAPSULE | Freq: Two times a day (BID) | ORAL | Status: DC | PRN

## 2012-07-17 MED ORDER — FENTANYL CITRATE 0.05 MG/ML IJ SOLN
INTRAMUSCULAR | Status: DC | PRN
  Administered 2012-07-17: 50 ug via INTRAVENOUS
  Administered 2012-07-17 (×2): 100 ug via INTRAVENOUS

## 2012-07-17 MED ORDER — MIDAZOLAM HCL 5 MG/5ML IJ SOLN
INTRAMUSCULAR | Status: DC | PRN
  Administered 2012-07-17: 2 mg via INTRAVENOUS

## 2012-07-17 MED ORDER — PROMETHAZINE HCL 25 MG/ML IJ SOLN: 6.2500 mg | INTRAMUSCULAR | Status: DC | PRN

## 2012-07-17 MED ORDER — KETOROLAC TROMETHAMINE 30 MG/ML IJ SOLN
INTRAMUSCULAR | Status: DC | PRN
  Administered 2012-07-17: 30 mg via INTRAVENOUS

## 2012-07-17 MED ORDER — DEXAMETHASONE SODIUM PHOSPHATE 4 MG/ML IJ SOLN
INTRAMUSCULAR | Status: DC | PRN
  Administered 2012-07-17: 5 mg via INTRAVENOUS

## 2012-07-17 MED ORDER — PROPOFOL 10 MG/ML IV EMUL
INTRAVENOUS | Status: DC | PRN
  Administered 2012-07-17: 200 mg via INTRAVENOUS

## 2012-07-17 MED ORDER — KETOROLAC TROMETHAMINE 30 MG/ML IJ SOLN: 15.0000 mg | Freq: Once | INTRAMUSCULAR | Status: DC | PRN

## 2012-07-17 MED ORDER — ROCURONIUM BROMIDE 100 MG/10ML IV SOLN
INTRAVENOUS | Status: DC | PRN
  Administered 2012-07-17: 30 mg via INTRAVENOUS
  Administered 2012-07-17: 10 mg via INTRAVENOUS

## 2012-07-17 MED ORDER — BUPIVACAINE HCL (PF) 0.25 % IJ SOLN
INTRAMUSCULAR | Status: DC | PRN
  Administered 2012-07-17: 10 mL

## 2012-07-17 MED ORDER — FENTANYL CITRATE 0.05 MG/ML IJ SOLN
25.0000 ug | INTRAMUSCULAR | Status: DC | PRN
  Administered 2012-07-17 (×3): 50 ug via INTRAVENOUS

## 2012-07-17 MED ORDER — DEXAMETHASONE SODIUM PHOSPHATE 10 MG/ML IJ SOLN
INTRAMUSCULAR | Status: AC
  Filled 2012-07-17: qty 1

## 2012-07-17 MED ORDER — LIDOCAINE HCL (CARDIAC) 20 MG/ML IV SOLN
INTRAVENOUS | Status: AC
  Filled 2012-07-17: qty 5

## 2012-07-17 MED ORDER — IBUPROFEN 800 MG PO TABS: 800.0000 mg | ORAL_TABLET | Freq: Three times a day (TID) | ORAL | Status: DC | PRN

## 2012-07-17 MED ORDER — ATROPINE SULFATE 0.4 MG/ML IJ SOLN
INTRAMUSCULAR | Status: DC | PRN
  Administered 2012-07-17: .4 mg via INTRAVENOUS

## 2012-07-17 MED ORDER — ONDANSETRON HCL 4 MG/2ML IJ SOLN
INTRAMUSCULAR | Status: DC | PRN
  Administered 2012-07-17: 4 mg via INTRAVENOUS

## 2012-07-17 MED ORDER — LIDOCAINE HCL (CARDIAC) 20 MG/ML IV SOLN
INTRAVENOUS | Status: DC | PRN
  Administered 2012-07-17: 80 mg via INTRAVENOUS

## 2012-07-17 MED ORDER — LACTATED RINGERS IR SOLN
Status: DC | PRN
  Administered 2012-07-17: 3000 mL

## 2012-07-17 MED ORDER — HYDROMORPHONE HCL PF 1 MG/ML IJ SOLN
INTRAMUSCULAR | Status: DC | PRN
  Administered 2012-07-17: 0.5 mg via INTRAVENOUS
  Administered 2012-07-17: 1 mg via INTRAVENOUS
  Administered 2012-07-17: 0.5 mg via INTRAVENOUS

## 2012-07-17 MED ORDER — BUPIVACAINE HCL (PF) 0.25 % IJ SOLN
INTRAMUSCULAR | Status: AC
  Filled 2012-07-17: qty 30

## 2012-07-17 MED ORDER — ATROPINE SULFATE 0.4 MG/ML IJ SOLN
INTRAMUSCULAR | Status: AC
  Filled 2012-07-17: qty 1

## 2012-07-17 MED ORDER — FENTANYL CITRATE 0.05 MG/ML IJ SOLN
INTRAMUSCULAR | Status: AC
  Filled 2012-07-17: qty 5

## 2012-07-17 MED ORDER — MIDAZOLAM HCL 2 MG/2ML IJ SOLN
INTRAMUSCULAR | Status: AC
  Filled 2012-07-17: qty 2

## 2012-07-17 MED ORDER — LACTATED RINGERS IV SOLN
INTRAVENOUS | Status: DC
  Administered 2012-07-17 (×2): via INTRAVENOUS

## 2012-07-17 MED ORDER — HYDROCODONE-ACETAMINOPHEN 5-325 MG PO TABS: 1.0000 | ORAL_TABLET | ORAL | Status: DC | PRN

## 2012-07-17 MED ORDER — ONDANSETRON HCL 4 MG/2ML IJ SOLN
INTRAMUSCULAR | Status: AC
  Filled 2012-07-17: qty 2

## 2012-07-17 MED ORDER — ONDANSETRON HCL 4 MG/2ML IJ SOLN: INTRAMUSCULAR | Status: DC | PRN

## 2012-07-17 MED ORDER — GLYCOPYRROLATE 0.2 MG/ML IJ SOLN
INTRAMUSCULAR | Status: AC
  Filled 2012-07-17: qty 3

## 2012-07-17 MED ORDER — ROCURONIUM BROMIDE 50 MG/5ML IV SOLN
INTRAVENOUS | Status: AC
  Filled 2012-07-17: qty 1

## 2012-07-17 MED ORDER — GLYCOPYRROLATE 0.2 MG/ML IJ SOLN
INTRAMUSCULAR | Status: DC | PRN
  Administered 2012-07-17: .2 mg via INTRAVENOUS
  Administered 2012-07-17: 0.4 mg via INTRAVENOUS

## 2012-07-17 MED ORDER — MIDAZOLAM HCL 2 MG/2ML IJ SOLN: 0.5000 mg | Freq: Once | INTRAMUSCULAR | Status: DC | PRN

## 2012-07-17 MED ORDER — NEOSTIGMINE METHYLSULFATE 1 MG/ML IJ SOLN
INTRAMUSCULAR | Status: DC | PRN
  Administered 2012-07-17: 3 mg via INTRAVENOUS

## 2012-07-17 MED ORDER — PROPOFOL 10 MG/ML IV EMUL
INTRAVENOUS | Status: AC
  Filled 2012-07-17: qty 20

## 2012-07-17 MED ORDER — MEPERIDINE HCL 25 MG/ML IJ SOLN: 6.2500 mg | INTRAMUSCULAR | Status: DC | PRN

## 2012-07-17 SURGICAL SUPPLY — 30 items
ADH SKN CLS APL DERMABOND .7 (GAUZE/BANDAGES/DRESSINGS) ×1
ADH SKN CLS LQ APL DERMABOND (GAUZE/BANDAGES/DRESSINGS) ×1
BAG SPEC RTRVL LRG 6X4 10 (ENDOMECHANICALS) ×1
CHLORAPREP W/TINT 26ML (MISCELLANEOUS) ×2 IMPLANT
DERMABOND ADHESIVE PROPEN (GAUZE/BANDAGES/DRESSINGS) ×1
DERMABOND ADVANCED (GAUZE/BANDAGES/DRESSINGS) ×1
DERMABOND ADVANCED .7 DNX12 (GAUZE/BANDAGES/DRESSINGS) ×1 IMPLANT
DERMABOND ADVANCED .7 DNX6 (GAUZE/BANDAGES/DRESSINGS) IMPLANT
ELECT REM PT RETURN 9FT ADLT (ELECTROSURGICAL)
ELECTRODE REM PT RTRN 9FT ADLT (ELECTROSURGICAL) IMPLANT
GLOVE BIOGEL PI IND STRL 6.5 (GLOVE) ×1 IMPLANT
GLOVE BIOGEL PI INDICATOR 6.5 (GLOVE) ×1
GLOVE SURG SS PI 6.0 STRL IVOR (GLOVE) ×2 IMPLANT
GOWN PREVENTION PLUS LG XLONG (DISPOSABLE) ×4 IMPLANT
NS IRRIG 1000ML POUR BTL (IV SOLUTION) ×2 IMPLANT
PACK LAPAROSCOPY BASIN (CUSTOM PROCEDURE TRAY) ×2 IMPLANT
POUCH SPECIMEN RETRIEVAL 10MM (ENDOMECHANICALS) ×1 IMPLANT
PROTECTOR NERVE ULNAR (MISCELLANEOUS) ×2 IMPLANT
SCALPEL HARMONIC ACE (MISCELLANEOUS) ×1 IMPLANT
SEALER TISSUE G2 CVD JAW 35 (ENDOMECHANICALS) IMPLANT
SEALER TISSUE G2 CVD JAW 45CM (ENDOMECHANICALS)
SET IRRIG TUBING LAPAROSCOPIC (IRRIGATION / IRRIGATOR) ×1 IMPLANT
SUT MNCRL AB 4-0 PS2 18 (SUTURE) ×2 IMPLANT
SUT VICRYL 0 UR6 27IN ABS (SUTURE) ×4 IMPLANT
TOWEL OR 17X24 6PK STRL BLUE (TOWEL DISPOSABLE) ×4 IMPLANT
TROCAR BALLN 12MMX100 BLUNT (TROCAR) IMPLANT
TROCAR BALLN GELPORT 12X130M (ENDOMECHANICALS) ×1 IMPLANT
TROCAR XCEL NON-BLD 11X100MML (ENDOMECHANICALS) IMPLANT
TROCAR XCEL NON-BLD 5MMX100MML (ENDOMECHANICALS) ×2 IMPLANT
WATER STERILE IRR 1000ML POUR (IV SOLUTION) ×2 IMPLANT

## 2012-07-17 NOTE — Anesthesia Postprocedure Evaluation (Signed)
Anesthesia Post Note  Patient: Latasha Parker  Procedure(s) Performed: Procedure(s) (LRB): LAPAROSCOPY OPERATIVE (N/A)  Anesthesia type: General  Patient location: PACU  Post pain: Pain level controlled  Post assessment: Post-op Vital signs reviewed  Last Vitals:  Filed Vitals:   07/17/12 1245  BP: 94/55  Pulse: 68  Temp: 36.4 C  Resp: 22    Post vital signs: Reviewed  Level of consciousness: sedated  Complications: No apparent anesthesia complications

## 2012-07-17 NOTE — Op Note (Addendum)
Latasha Parker PROCEDURE DATE: 07/17/2012  PREOPERATIVE DIAGNOSIS: Persistent 5 cm left ovarian cyst POSTOPERATIVE DIAGNOSIS: same PROCEDURE: Laparoscopic left salpingo-oophorectomy, and pelvic biospy SURGEON:  Dr. Catalina Antigua ASSISTANT: Dr. Macon Large ANESTHESIOLOGIST: Sandrea Hughs., MD Anesthesiologist: Eusebio Friendly., MD; Earl Gala Scharlene Corn., MD CRNA: Doree Fudge Rhymer, CRNA; Graciela Husbands, CRNA  INDICATIONS: 37 y.o. [phone] with abdominal pain and persistent 5 cm left ovarian cyst. Patient was counseled regarding need for laparoscopic cystectomy. Risks of surgery including bleeding which may require transfusion or reoperation, infection, injury to bowel or other surrounding organs, need for additional procedures including laparotomy and other postoperative/anesthesia complications were explained to patient.  Written informed consent was obtained.  FINDINGS:  Enlarged left ovary with multiple cyst. 5 cm dermoid cyst cannot be discretely identified Small normal appearing uterus, normal fallopian tubes x 2, and normal right ovary. Endometrial implant were noted in the left pelvic cul de sac and midline in the cul de sac  ANESTHESIA: General INTRAVENOUS FLUIDS: .1600 ml ESTIMATED BLOOD LOSS:  50 ml URINE OUTPUT: 100 ml SPECIMENS: left tube and ovary. Biopsy of left pelvic cul de sac COMPLICATIONS: None immediate  PROCEDURE IN DETAIL:  The patient was taken to the operating room where general anesthesia was administered and was found to be adequate.  She was placed in the dorsal lithotomy position, and was prepped and draped in a sterile manner.  A Foley catheter was inserted into her bladder and attached to [name] drainage and a uterine manipulator was then advanced into the uterus .  After an adequate timeout was performed, attention was then turned to the patient's abdomen where a 10-mm skin incision was made on the umbilical fold.  The fascia was identified,  tented up and incised with Mayo scissors. The fascia was tagged with 0- Vicryl. The peritoneum was identified tented up and entered sharply with Metzenbaum scissors.  10-mm trocar and sleeve were then advanced without difficulty into the abdomen where intraabdominal placement was confirmed by the laparoscope. Pneumoperitoneum was achieved with insufflation of CO2 gas. A survey of the patient's pelvis and abdomen revealed the findings as above.  Bilateral lower quadrant ports were placed under direct visualization; 5-mm on the left and 5-mm on the right.    Attention was then turned to the left ovary. Due to its enlarged size, decision was made to perform a salpingo-oophorectomy. The IP ligament was coagulated and transected using the Harmonic scalpel, the utero-ovarian ligament and fallopian tube were also coagulated and transected allowing for removal of the tube and ovary. Good hemostasis was noted.  The specimen was placed in an EndoCatch bag and removed from the abdomen intact. The 5-mm Nezhat suction irrigator was then used to copiously  irrigate the pelvis. Using biopsy forceps, a biopsy of what appears to be endometrial implant in the left cul de sac was performed. Again, hemostasis was maintained. The abdomen was desufflated, and all instruments were removed.  The fascial incisions of both 10-mm sites were reapproximated with 0 Vicryl figure-of-eight stiches; and all skin incisions were closed with a 3-0 Vicryl subcuticular stitch. The patient tolerated the procedures well.  All instruments, needles, and sponge counts were correct x 2. The patient was taken to the recovery room in stable condition.  The patient will be discharged to home as per PACU criteria.  Routine postoperative instructions given.  She was prescribed Percocet, Ibuprofen and Colace.  She will follow up in the clinic in 2 weeks for postoperative evaluation .

## 2012-07-17 NOTE — Transfer of Care (Signed)
Immediate Anesthesia Transfer of Care Note  Patient: Latasha Parker  Procedure(s) Performed: Procedure(s) with comments: LAPAROSCOPY OPERATIVE (N/A) - left oopherectomy and salpingectomy  Patient Location: PACU  Anesthesia Type:General  Level of Consciousness: awake, alert  and oriented  Airway & Oxygen Therapy: Patient Spontanous Breathing and Patient connected to nasal cannula oxygen  Post-op Assessment: Report given to PACU RN and Post -op Vital signs reviewed and stable  Post vital signs: stable  Complications: No apparent anesthesia complications

## 2012-07-17 NOTE — Anesthesia Preprocedure Evaluation (Signed)
Anesthesia Evaluation  Patient identified by MRN, date of birth, ID band Patient awake    Reviewed: Allergy & Precautions, H&P , Patient's Chart, lab work & pertinent test results, reviewed documented beta blocker date and time   History of Anesthesia Complications Negative for: history of anesthetic complications  Airway Mallampati: II TM Distance: >3 FB Neck ROM: full    Dental no notable dental hx.    Pulmonary neg pulmonary ROS,  breath sounds clear to auscultation  Pulmonary exam normal       Cardiovascular Exercise Tolerance: Good negative cardio ROS  Rhythm:regular Rate:Normal     Neuro/Psych negative neurological ROS  negative psych ROS   GI/Hepatic negative GI ROS, Neg liver ROS,   Endo/Other  negative endocrine ROS  Renal/GU negative Renal ROS     Musculoskeletal   Abdominal   Peds  Hematology negative hematology ROS (+)   Anesthesia Other Findings Deaf     SVD (spontaneous vaginal delivery)        Hyperthyroidism          Reproductive/Obstetrics negative OB ROS                           Anesthesia Physical Anesthesia Plan  ASA: II  Anesthesia Plan: General ETT   Post-op Pain Management:    Induction:   Airway Management Planned:   Additional Equipment:   Intra-op Plan:   Post-operative Plan:   Informed Consent: I have reviewed the patients History and Physical, chart, labs and discussed the procedure including the risks, benefits and alternatives for the proposed anesthesia with the patient or authorized representative who has indicated his/her understanding and acceptance.   Dental Advisory Given  Plan Discussed with: CRNA and Surgeon  Anesthesia Plan Comments:         Anesthesia Quick Evaluation

## 2012-07-18 ENCOUNTER — Encounter (HOSPITAL_COMMUNITY): Payer: Self-pay | Admitting: Obstetrics and Gynecology

## 2012-07-24 ENCOUNTER — Telehealth: Payer: Self-pay | Admitting: *Deleted

## 2012-07-24 DIAGNOSIS — R102 Pelvic and perineal pain: Secondary | ICD-10-CM

## 2012-07-24 MED ORDER — HYDROCODONE-ACETAMINOPHEN 5-325 MG PO TABS: 1.0000 | ORAL_TABLET | ORAL | Status: DC | PRN

## 2012-07-24 NOTE — Telephone Encounter (Signed)
Pt called via [employer] interpreter # [phone]. She stated that she is still having pain after surgery 1 week ago. She has run out of Vicodin and did not sleep well last night. She is requesting refill of Vicodin. I contacted Dr. Debroah Loop and received authorization for refill. Pt was informed that this will be the last refill of narcotic medication. She should use the ibuprofen as well for her pain and she has 1 refill remaining. She will need to keep f/u appt as scheduled on 07/30/12 @ 1100 w/Dr. Jolayne Panther.  Pt voiced understanding and agreed to appt on 3/10.

## 2012-07-30 ENCOUNTER — Ambulatory Visit: Payer: Medicaid Other | Admitting: Obstetrics and Gynecology

## 2012-08-02 ENCOUNTER — Ambulatory Visit: Payer: Medicaid Other

## 2012-08-13 ENCOUNTER — Ambulatory Visit: Payer: Medicaid Other | Admitting: Obstetrics and Gynecology

## 2012-08-31 ENCOUNTER — Encounter: Payer: Self-pay | Admitting: Obstetrics and Gynecology

## 2012-08-31 ENCOUNTER — Ambulatory Visit (INDEPENDENT_AMBULATORY_CARE_PROVIDER_SITE_OTHER): Payer: Medicaid Other | Admitting: Obstetrics and Gynecology

## 2012-08-31 VITALS — BP 121/76 | HR 67 | Temp 97.2°F | Ht 68.0 in | Wt 170.7 lb

## 2012-08-31 DIAGNOSIS — Z3049 Encounter for surveillance of other contraceptives: Secondary | ICD-10-CM

## 2012-08-31 DIAGNOSIS — Z3042 Encounter for surveillance of injectable contraceptive: Secondary | ICD-10-CM

## 2012-08-31 DIAGNOSIS — Z01812 Encounter for preprocedural laboratory examination: Secondary | ICD-10-CM

## 2012-08-31 DIAGNOSIS — Z09 Encounter for follow-up examination after completed treatment for conditions other than malignant neoplasm: Secondary | ICD-10-CM

## 2012-08-31 LAB — POCT PREGNANCY, URINE: Preg Test, Ur: NEGATIVE

## 2012-08-31 MED ORDER — MEDROXYPROGESTERONE ACETATE 150 MG/ML IM SUSP
150.0000 mg | Freq: Once | INTRAMUSCULAR | Status: AC
  Administered 2012-08-31: 150 mg via INTRAMUSCULAR

## 2012-08-31 NOTE — Addendum Note (Signed)
Addended by: Soyla Murphy T on: 08/31/2012 10:59 AM   Modules accepted: Orders

## 2012-08-31 NOTE — Progress Notes (Signed)
  Subjective:     Latasha Parker is a 37 y.o. female who presents to the clinic 6 weeks status post left oophorectomy via laparoscopy for dermoid cyst. Eating a regular diet without difficulty. Bowel movements are normal. The patient reports some left sided discomfort. Patient also reports some spotting noted when she wipes. She is using Depo-provera for birth control     Review of Systems A comprehensive review of systems was negative.    Objective:    BP 121/76  Pulse 67  Temp(Src) 97.2 F (36.2 C) (Oral)  Ht 5\' 8"  (1.727 m)  Wt 170 lb 11.2 oz (77.429 kg)  BMI 25.96 kg/m2 General:  alert, cooperative and no distress  Abdomen: soft, bowel sounds active, non-tender  Incision:   healing well, no drainage, no erythema, incision well approximated     Assessment:    Doing well postoperatively. Operative findings again reviewed. Pathology report discussed.    Plan:    1. Continue any current medications. 2. Patient would like to continue with depo-provera 3. Activity restrictions: none 4. Anticipated return to work: now. 5. Follow up: 4 months for annual exam.

## 2012-10-22 ENCOUNTER — Telehealth: Payer: Self-pay | Admitting: Obstetrics and Gynecology

## 2012-10-22 NOTE — Telephone Encounter (Signed)
Patient wants to get follow up appt for pelvic pain and intermittent vaginal bleed. Patient knows to go to [facility] for worsening vaginal bleed (pad having to change >3 times/ hour). Cc to admin pool to call back patient and make appt.

## 2012-11-19 ENCOUNTER — Ambulatory Visit (INDEPENDENT_AMBULATORY_CARE_PROVIDER_SITE_OTHER): Payer: Medicaid Other | Admitting: Obstetrics and Gynecology

## 2012-11-19 VITALS — BP 102/75 | HR 80 | Ht 68.0 in | Wt 168.0 lb

## 2012-11-19 DIAGNOSIS — Z3049 Encounter for surveillance of other contraceptives: Secondary | ICD-10-CM

## 2012-11-19 DIAGNOSIS — Z309 Encounter for contraceptive management, unspecified: Secondary | ICD-10-CM

## 2012-11-19 MED ORDER — MEDROXYPROGESTERONE ACETATE 150 MG/ML IM SUSP
150.0000 mg | INTRAMUSCULAR | Status: DC
  Administered 2012-11-19: 150 mg via INTRAMUSCULAR

## 2012-11-19 NOTE — Progress Notes (Signed)
Patient c/o pain on pelvic and vaginal beed on/off. She was requesting a refill on vicodin. Per Dr. Debroah Loop request denied. She can take the ibuprofen 800 mg q 8H until her next appt on Monday. Patient satisfied. Used linda as a sign language interpreter.

## 2012-11-26 ENCOUNTER — Ambulatory Visit (INDEPENDENT_AMBULATORY_CARE_PROVIDER_SITE_OTHER): Payer: Medicaid Other | Admitting: Family Medicine

## 2012-11-26 ENCOUNTER — Encounter: Payer: Self-pay | Admitting: Family Medicine

## 2012-11-26 VITALS — BP 114/80 | HR 95 | Temp 99.1°F | Ht 68.0 in | Wt 171.2 lb

## 2012-11-26 DIAGNOSIS — N949 Unspecified condition associated with female genital organs and menstrual cycle: Secondary | ICD-10-CM

## 2012-11-26 DIAGNOSIS — N938 Other specified abnormal uterine and vaginal bleeding: Secondary | ICD-10-CM

## 2012-11-26 DIAGNOSIS — N809 Endometriosis, unspecified: Secondary | ICD-10-CM

## 2012-11-26 DIAGNOSIS — N920 Excessive and frequent menstruation with regular cycle: Secondary | ICD-10-CM

## 2012-11-26 DIAGNOSIS — N946 Dysmenorrhea, unspecified: Secondary | ICD-10-CM

## 2012-11-26 MED ORDER — HYDROCODONE-ACETAMINOPHEN 5-325 MG PO TABS: 1.0000 | ORAL_TABLET | ORAL | Status: DC | PRN

## 2012-11-26 MED ORDER — IBUPROFEN 800 MG PO TABS: 800.0000 mg | ORAL_TABLET | Freq: Three times a day (TID) | ORAL | Status: DC | PRN

## 2012-11-26 MED ORDER — LEUPROLIDE ACETATE (3 MONTH) 11.25 MG IM KIT
11.2500 mg | PACK | Freq: Once | INTRAMUSCULAR | Status: AC
  Administered 2012-11-26: 11.25 mg via INTRAMUSCULAR

## 2012-11-26 NOTE — Patient Instructions (Addendum)
Endometriosis Endometriosis is a disease that occurs when the endometrium (lining of the uterus) is misplaced outside of its normal location. It may occur in many locations close to the uterus (womb), but commonly on the ovaries, fallopian tubes, vagina (birth canal) and bowel located close to the uterus. Because the uterus sloughs (expels) its lining every month (menses), there is bleeding whereever the endometrial tissue is located. SYMPTOMS  Often there are no symptoms. However, because blood is irritating to tissues not normally exposed to it, when symptoms occur they vary with the location of the misplaced endometrium. Symptoms often include back and abdominal pain. Periods may be heavier and intercourse may be painful. Infertility may be present. You may have all of these symptoms at one time or another or you may have months with no symptoms at all. Although the symptoms occur mainly during menses, they can occur mid-cycle as well, and usually terminate with menopause. DIAGNOSIS  Your caregiver may recommend a blood test and urine test (urinalysis) to help rule out other conditions. Another common test is ultrasound, a painless procedure that uses sound waves to make a sonogram "picture" of abnormal tissue that could be endometriosis. If your bowel movements are painful around your periods, your caregiver may advise a barium enema (an X-ray of the lower bowel), to try to find the source of your pain. This is sometimes confirmed by laparoscopy. Laparoscopy is a procedure where your caregiver looks into your abdomen with a laparoscope (a small pencil sized telescope). Your caregiver may take a tiny piece of tissue (biopsy) from any abnormal tissue to confirm or document your problem. These tissues are sent to the lab and a pathologist looks at them under the microscope to give a microscopic diagnosis. TREATMENT  Once the diagnosis is made, it can be treated by destruction of the misplaced endometrial  tissue using heat (diathermy), laser, cutting (excision), or chemical means. It may also be treated with hormonal therapy. When using hormonal therapy menses are eliminated, therefore eliminating the monthly exposure to blood by the misplaced endometrial tissue. Only in severe cases is it necessary to perform a hysterectomy with removal of the tubes, uterus and ovaries. HOME CARE INSTRUCTIONS   Only take over-the-counter or prescription medicines for pain, discomfort, or fever as directed by your caregiver.  Avoid activities that produce pain, including a physical sexual relationship.  Do not take aspirin as this may increase bleeding when not on hormonal therapy.  See your caregiver for pain or problems not controlled with treatment. SEEK IMMEDIATE MEDICAL CARE IF:   Your pain is severe and is not responding to pain medicine.  You develop severe nausea and vomiting, or you cannot keep foods down.  Your pain localizes to the right lower part of your abdomen (possible appendicitis).  You have swelling or increasing pain in the abdomen.  You have a fever.  You see blood in your stool. MAKE SURE YOU:   Understand these instructions.  Will watch your condition.  Will get help right away if you are not doing well or get worse. Document Released: 05/06/2000 Document Revised: 08/01/2011 Document Reviewed: 12/26/2007 [facility] Patient Information 2014 [place], [place].  Leuprolide injection What is this medicine? LEUPROLIDE (loo PROE lide) is a man-made hormone. It is used to treat the symptoms of prostate cancer. This medicine may also be used to treat children with early onset of puberty. It may be used for other hormonal conditions. This medicine may be used for other purposes; ask your health  care provider or pharmacist if you have questions. What should I tell my health care provider before I take this medicine? They need to know if you have any of these  conditions: -diabetes -heart disease or previous heart attack -high blood pressure -high cholesterol -pain or difficulty passing urine -spinal cord metastasis -stroke -tobacco smoker -an unusual or allergic reaction to leuprolide, benzyl alcohol, other medicines, foods, dyes, or preservatives -pregnant or trying to get pregnant -breast-feeding How should I use this medicine? This medicine is for injection under the skin or into a muscle. You will be taught how to prepare and give this medicine. Use exactly as directed. Take your medicine at regular intervals. Do not take your medicine more often than directed. It is important that you put your used needles and syringes in a special sharps container. Do not put them in a trash can. If you do not have a sharps container, call your pharmacist or healthcare provider to get one. Talk to your pediatrician regarding the use of this medicine in children. While this medicine may be prescribed for children as young as 8 years for selected conditions, precautions do apply. Overdosage: If you think you have taken too much of this medicine contact a poison control center or emergency room at once. NOTE: This medicine is only for you. Do not share this medicine with others. What if I miss a dose? If you miss a dose, take it as soon as you can. If it is almost time for your next dose, take only that dose. Do not take double or extra doses. What may interact with this medicine? Do not take this medicine with any of the following medications: -chasteberry This medicine may also interact with the following medications: -herbal or dietary supplements, like black cohosh or DHEA -female hormones, like estrogens or progestins and birth control pills, patches, rings, or injections -female hormones, like testosterone This list may not describe all possible interactions. Give your health care provider a list of all the medicines, herbs, non-prescription drugs, or  dietary supplements you use. Also tell them if you smoke, drink alcohol, or use illegal drugs. Some items may interact with your medicine. What should I watch for while using this medicine? Visit your doctor or health care professional for regular checks on your progress. During the first week, your symptoms may get worse, but then will improve as you continue your treatment. You may get hot flashes, increased bone pain, increased difficulty passing urine, or an aggravation of nerve symptoms. Discuss these effects with your doctor or health care professional, some of them may improve with continued use of this medicine. Female patients may experience a menstrual cycle or spotting during the first 2 months of therapy with this medicine. If this continues, contact your doctor or health care professional. What side effects may I notice from receiving this medicine? Side effects that you should report to your doctor or health care professional as soon as possible: -allergic reactions like skin rash, itching or hives, swelling of the face, lips, or tongue -breathing problems -chest pain -depression or memory disorders -pain in your legs or groin -pain at site where injected -severe headache -swelling of the feet and legs -visual changes -vomiting Side effects that usually do not require medical attention (report to your doctor or health care professional if they continue or are bothersome): -breast swelling or tenderness -decrease in sex drive or performance -diarrhea -hot flashes -loss of appetite -muscle, joint, or bone pains -nausea -redness or  irritation at site where injected -skin problems or acne This list may not describe all possible side effects. Call your doctor for medical advice about side effects. You may report side effects to FDA at 1-800-FDA-1088. Where should I keep my medicine? Keep out of the reach of children. Store below 25 degrees C (77 degrees F). Do not freeze.  Protect from light. Do not use if it is not clear or if there are particles present. Throw away any unused medicine after the expiration date. NOTE: This sheet is a summary. It may not cover all possible information. If you have questions about this medicine, talk to your doctor, pharmacist, or health care provider.  2013, [employer]. (09/23/2008 1:26:20 PM)

## 2012-11-26 NOTE — Progress Notes (Signed)
Patient ID: Latasha Parker, female   DOB: 27-Apr-1976, 37 y.o.   MRN: [id number]   S:  37 y.o. [id number] with irregular periods, pelvic pain, back pain, headaches. Heavy bleeding with clots.  Had laparoscopic left oopherectomy in February for ovarian cyst. Biopsy showed mature teratoma, normal fallopian tube. Cul de sac biopsy showed endometriosis. Prior to surgery, pt states periods were regular but heavy. She started Depo shots 7 months ago for heavy periods. But she states that her periods are worse now. She c/o lower abdominal pain, bloating, cramping, back pain with periods, worse when walking or engaged in other physical activity, but now also interfering with sleep. Pain is bilateral but worse on left where she had surgery.  Last period started yesterday. Last Depo shot one week ago (6/30).   Her bleeding is occurring 3 or 4 days out of every week. She has tried OCPS when she was younger, but these made her feel bloated.   Past Medical History  Diagnosis Date  . Deaf   . SVD (spontaneous vaginal delivery)   . Hyperthyroidism   . Menorrhagia   . Pelvic pain    Past Surgical History  Procedure Laterality Date  . Ovary tumor      cyst  . Foot surgery    . Diagnostic laparoscopy  2003    ovarian cyst removed  . Wisdom tooth extraction    . Laparoscopy N/A 07/17/2012    Procedure: LAPAROSCOPY OPERATIVE;  Surgeon: Catalina Antigua, MD;  Location: [facility];  Service: Gynecology;  Laterality: N/A;  left oopherectomy and salpingectomy    History   Social History  . Marital Status: Single    Spouse Name: N/A    Number of Children: N/A  . Years of Education: N/A   Occupational History  . Not on file.   Social History Main Topics  . Smoking status: Current Every Day Smoker -- 1.00 packs/day for 15 years    Types: Cigarettes  . Smokeless tobacco: Never Used  . Alcohol Use: No  . Drug Use: No  . Sexually Active: Yes    Birth Control/ Protection: Injection   Other Topics Concern  . Not  on file   Social History Narrative  . No narrative on file   Family History  Problem Relation Age of Onset  . Cancer Maternal Grandmother   . Cancer Mother   . Cancer Father     O:  Filed Vitals:   11/26/12 1508  BP: 114/80  Pulse: 95  Temp: 99.1 F (37.3 C)   GEN:  WNWD, no distress HEENT:  NCAT, EOMI, conjunctiva clear CV: RRR, no murmur RESP:  CTAB ABD:  Soft, non-tender, no guarding or rebound, normal bowel sounds EXTREM:  Warm, well perfused, no edema or tenderness NEURO:  Alert, oriented, no focal deficits (deaf - using sign language interpreter) GU:  Deferred  A/P 37 y.o. [id number] with pelvic pain and abnormal uterine bleeding - s/p laparoscopic left oopherectomy for dermoid cyst - Endometriosis - likely explains the majority of her symptoms.  Start Lupron 11.25 mg q 3 months x 2 doses Discussed possible perimenopausal symptoms including hot flashes. Just had depo shot - may help to counteract hot flashes. If not, will try provera PO.  Pt agreeable to plan. Stop Depo. F/U in 3 months.

## 2012-11-26 NOTE — Progress Notes (Signed)
Here for follow up of menorrhagia and pelvic pain. States bleeding comes on and off and lasts a few days when on and everytime has bleeding has bad headache that lasts whole time she has bleeding. Also c/o dizziness at times and lower back pain. States pain is worse when up and moving

## 2012-11-27 DIAGNOSIS — N809 Endometriosis, unspecified: Secondary | ICD-10-CM | POA: Insufficient documentation

## 2012-12-10 ENCOUNTER — Other Ambulatory Visit: Payer: Self-pay

## 2012-12-10 DIAGNOSIS — N938 Other specified abnormal uterine and vaginal bleeding: Secondary | ICD-10-CM

## 2012-12-10 DIAGNOSIS — N946 Dysmenorrhea, unspecified: Secondary | ICD-10-CM

## 2012-12-10 MED ORDER — HYDROCODONE-ACETAMINOPHEN 5-325 MG PO TABS: 1.0000 | ORAL_TABLET | ORAL | Status: DC | PRN

## 2012-12-10 MED ORDER — MEDROXYPROGESTERONE ACETATE 10 MG PO TABS: 10.0000 mg | ORAL_TABLET | Freq: Every day | ORAL | Status: DC

## 2012-12-24 ENCOUNTER — Encounter: Payer: Self-pay | Admitting: Family Medicine

## 2012-12-24 ENCOUNTER — Ambulatory Visit (INDEPENDENT_AMBULATORY_CARE_PROVIDER_SITE_OTHER): Payer: Medicaid Other | Admitting: Family Medicine

## 2012-12-24 VITALS — BP 111/79 | HR 77 | Temp 97.8°F | Ht 68.0 in | Wt 168.0 lb

## 2012-12-24 DIAGNOSIS — N949 Unspecified condition associated with female genital organs and menstrual cycle: Secondary | ICD-10-CM

## 2012-12-24 DIAGNOSIS — N946 Dysmenorrhea, unspecified: Secondary | ICD-10-CM

## 2012-12-24 DIAGNOSIS — N938 Other specified abnormal uterine and vaginal bleeding: Secondary | ICD-10-CM

## 2012-12-24 MED ORDER — MELOXICAM 15 MG PO TABS: 15.0000 mg | ORAL_TABLET | Freq: Every day | ORAL | Status: DC

## 2012-12-24 MED ORDER — HYDROCODONE-ACETAMINOPHEN 5-325 MG PO TABS: 1.0000 | ORAL_TABLET | ORAL | Status: DC | PRN

## 2012-12-24 NOTE — Progress Notes (Signed)
Subjective:     Patient ID: Latasha Parker, female   DOB: Mar 29, 1976, 37 y.o.   MRN: [id number]  HPI  37 y.o. [id number] with irregular periods, pelvic pain, back pain, headaches. Heavy bleeding with clots. Pt is deaf and here with an interpreter.   Had laparoscopic left oopherectomy in February for ovarian cyst. Biopsy showed mature teratoma, normal fallopian tube. Cul de sac biopsy showed endometriosis.   Prior to surgery, pt states periods were regular but heavy. She started Depo around the time of surgery ( 7 months ago). But she states that her periods are worse now- now almost constantly bleeding. She c/o lower abdominal pain, bloating, cramping, back pain with periods, worse when walking or engaged in other physical activity, but now also interfering with sleep. Pain is bilateral but worse on left where she had surgery. Last Depo shot  (6/30).   Her bleeding is occurring 3 or 4 days out of every week. She has tried OCPS when she was younger, but these made her feel bloated.   Seen 7/7 and given lupron. After that her bleeding got worse. Pain has seemed to worsen as well. Filling up a pad every 2 hours on heavy days which occur 3-4 days per week and then light days doesn't fill up a pad in a day.      States her worst symptom is the pain and she wants a hysterectomy in order to fix her symptoms   Previous treatments: Has done doses of provera, OCPs, depo, lupron   Has no energy, fatigued. +nausea NO fevers, chills, headaches. Weakness   Review of Systems See above    Objective:   Physical Exam Filed Vitals:   12/24/12 1532  BP: 111/79  Pulse: 77  Temp: 97.8 F (36.6 C)  TempSrc: Oral  Height: 5\' 8"  (1.727 m)  Weight: 168 lb (76.204 kg)     GEN: ND, well nourished, a&o x3 CV: normal pulses Pulm: normal effort ABD: soft, NT externally GU: deferred  EXT: no edema    Assessment:     DUB (dysfunctional uterine bleeding) - Plan: HYDROcodone-acetaminophen  (NORCO/VICODIN) 5-325 MG per tablet  Dysmenorrhea - Plan: HYDROcodone-acetaminophen (NORCO/VICODIN) 5-325 MG per tablet      Plan:     37 y.o. [id number] with pelvic pain and abnormal uterine bleeding  - s/p laparoscopic left oopherectomy for dermoid cyst  - Endometriosis - likely explains the majority of her symptoms. - now with bleeding likely due to unopposed progesterone use for the last 7 months leading to continuous bleeding   Discussed with the pt that her two symptoms need to be separated.    1) pelvic pain - likely endometriosis related - attempting to control this currently with lupron  - disucssed need to give the lupron the full 3 months - also discussed that the treatment needs to be an anti-inflammatory - pt states nothing she has been on before has worked (voltaren and ibuprofen) - rx of mobic 15mg  daily  2) DUB - likely due to unopposed progesterone - may improve as time goes on  - no symptoms of anemia - pt does not feel this is as much of a problem as the pain. May also be lessened by chronic NSAID use - should this become more of a problem, may try adding back some estrogen however this may likely worsen her pelvic pain  NOTE: pt after apt became frustrated at the window and stated that I told her I would give her vicodin.  I expalined that I said no such thing. Interpreter of course was unable to coroborate story of either person given her role.  Pt stated she needs something else for pain for the first few days of transitioning medication. I discussed with her that this is not an appropriate treatment. Given this, we agreed that I would give her 15 pills OTO to help transition to meloxicam. She understands that she will get no more from me after this point.    Would recommend that in the future, the pt be seen in the presence of another witness given her manipulation of words and stories.   She will f/u in 2 months.

## 2012-12-24 NOTE — Patient Instructions (Signed)
Endometriosis  Endometriosis is a disease that occurs when the endometrium (lining of the uterus) is misplaced outside of its normal location. It may occur in many locations close to the uterus (womb), but commonly on the ovaries, fallopian tubes, vagina (birth canal) and bowel located close to the uterus. Because the uterus sloughs (expels) its lining every month (menses), there is bleeding whereever the endometrial tissue is located.  SYMPTOMS   Often there are no symptoms. However, because blood is irritating to tissues not normally exposed to it, when symptoms occur they vary with the location of the misplaced endometrium. Symptoms often include back and abdominal pain. Periods may be heavier and intercourse may be painful. Infertility may be present. You may have all of these symptoms at one time or another or you may have months with no symptoms at all. Although the symptoms occur mainly during menses, they can occur mid-cycle as well, and usually terminate with menopause.  DIAGNOSIS   Your caregiver may recommend a blood test and urine test (urinalysis) to help rule out other conditions. Another common test is ultrasound, a painless procedure that uses sound waves to make a sonogram "picture" of abnormal tissue that could be endometriosis. If your bowel movements are painful around your periods, your caregiver may advise a barium enema (an X-ray of the lower bowel), to try to find the source of your pain. This is sometimes confirmed by laparoscopy. Laparoscopy is a procedure where your caregiver looks into your abdomen with a laparoscope (a small pencil sized telescope). Your caregiver may take a tiny piece of tissue (biopsy) from any abnormal tissue to confirm or document your problem. These tissues are sent to the lab and a pathologist looks at them under the microscope to give a microscopic diagnosis.  TREATMENT   Once the diagnosis is made, it can be treated by destruction of the misplaced endometrial  tissue using heat (diathermy), laser, cutting (excision), or chemical means. It may also be treated with hormonal therapy. When using hormonal therapy menses are eliminated, therefore eliminating the monthly exposure to blood by the misplaced endometrial tissue. Only in severe cases is it necessary to perform a hysterectomy with removal of the tubes, uterus and ovaries.  HOME CARE INSTRUCTIONS    Only take over-the-counter or prescription medicines for pain, discomfort, or fever as directed by your caregiver.   Avoid activities that produce pain, including a physical sexual relationship.   Do not take aspirin as this may increase bleeding when not on hormonal therapy.   See your caregiver for pain or problems not controlled with treatment.  SEEK IMMEDIATE MEDICAL CARE IF:    Your pain is severe and is not responding to pain medicine.   You develop severe nausea and vomiting, or you cannot keep foods down.   Your pain localizes to the right lower part of your abdomen (possible appendicitis).   You have swelling or increasing pain in the abdomen.   You have a fever.   You see blood in your stool.  MAKE SURE YOU:    Understand these instructions.   Will watch your condition.   Will get help right away if you are not doing well or get worse.  Document Released: 05/06/2000 Document Revised: 08/01/2011 Document Reviewed: 12/26/2007  [employer] Patient Information 2014 [employer].

## 2013-04-11 ENCOUNTER — Emergency Department (HOSPITAL_COMMUNITY): Payer: Medicaid Other

## 2013-04-11 ENCOUNTER — Emergency Department (HOSPITAL_COMMUNITY)
Admission: EM | Admit: 2013-04-11 | Discharge: 2013-04-11 | Disposition: A | Discharge status: Home / Self Care | Payer: Medicaid Other | Attending: Emergency Medicine | Admitting: Emergency Medicine

## 2013-04-11 ENCOUNTER — Encounter (HOSPITAL_COMMUNITY): Payer: Self-pay | Admitting: Emergency Medicine

## 2013-04-11 DIAGNOSIS — H9190 Unspecified hearing loss, unspecified ear: Secondary | ICD-10-CM | POA: Insufficient documentation

## 2013-04-11 DIAGNOSIS — F17200 Nicotine dependence, unspecified, uncomplicated: Secondary | ICD-10-CM | POA: Insufficient documentation

## 2013-04-11 DIAGNOSIS — Z8639 Personal history of other endocrine, nutritional and metabolic disease: Secondary | ICD-10-CM | POA: Insufficient documentation

## 2013-04-11 DIAGNOSIS — G8929 Other chronic pain: Secondary | ICD-10-CM | POA: Insufficient documentation

## 2013-04-11 DIAGNOSIS — Z862 Personal history of diseases of the blood and blood-forming organs and certain disorders involving the immune mechanism: Secondary | ICD-10-CM | POA: Insufficient documentation

## 2013-04-11 DIAGNOSIS — N949 Unspecified condition associated with female genital organs and menstrual cycle: Secondary | ICD-10-CM | POA: Insufficient documentation

## 2013-04-11 LAB — WET PREP, GENITAL
Trich, Wet Prep: NONE SEEN
Yeast Wet Prep HPF POC: NONE SEEN

## 2013-04-11 MED ORDER — OXYCODONE-ACETAMINOPHEN 5-325 MG PO TABS: 1.0000 | ORAL_TABLET | ORAL | Status: DC | PRN

## 2013-04-11 MED ORDER — OXYCODONE-ACETAMINOPHEN 5-325 MG PO TABS
1.0000 | ORAL_TABLET | Freq: Once | ORAL | Status: AC
  Administered 2013-04-11: 1 via ORAL
  Filled 2013-04-11: qty 1

## 2013-04-11 MED ORDER — IBUPROFEN 800 MG PO TABS: 800.0000 mg | ORAL_TABLET | Freq: Three times a day (TID) | ORAL | Status: DC

## 2013-04-11 NOTE — ED Provider Notes (Signed)
CSN: [id number]     Arrival date & time 04/11/13  0818 History   First MD Initiated Contact with Patient 04/11/13 0825     Chief Complaint  Patient presents with  . Pelvic Pain   (Consider location/radiation/quality/duration/timing/severity/associated sxs/prior Treatment) Patient is a 37 y.o. female presenting with pelvic pain. The history is provided by the patient. A language interpreter was used (Sign language interpreter in room.).  Pelvic Pain This is a chronic problem. Pertinent negatives include no chills or fever. Associated symptoms comments: She is here for evaluation of persistent pelvic pain for the past one year. She has a history of ovarian cyst with surgical intervention earlier this year. She is frustrated because she has ongoing pain even after the surgery and "my gynecologist isn't doing anything for me." No fever, change in bowel habits or pattern of irregular menses, no vaginal discharge, no dysuria. She denies nausea and vomiting. She states that current symptoms are the same symptoms that have persisted for a year or longer..    Past Medical History  Diagnosis Date  . Deaf   . SVD (spontaneous vaginal delivery)   . Hyperthyroidism   . Menorrhagia   . Pelvic pain    Past Surgical History  Procedure Laterality Date  . Ovary tumor      cyst  . Foot surgery    . Diagnostic laparoscopy  2003    ovarian cyst removed  . Wisdom tooth extraction    . Laparoscopy N/A 07/17/2012    Procedure: LAPAROSCOPY OPERATIVE;  Surgeon: Catalina Antigua, MD;  Location: [facility];  Service: Gynecology;  Laterality: N/A;  left oopherectomy and salpingectomy   Family History  Problem Relation Age of Onset  . Cancer Maternal Grandmother   . Cancer Mother   . Cancer Father    History  Substance Use Topics  . Smoking status: Current Every Day Smoker -- 1.00 packs/day for 15 years    Types: Cigarettes  . Smokeless tobacco: Never Used  . Alcohol Use: No   OB History   Grav Para Term  Preterm Abortions TAB SAB Ect Mult Living   2 2 2       2      Review of Systems  Constitutional: Negative for fever and chills.  Respiratory: Negative.   Cardiovascular: Negative.   Gastrointestinal: Negative.   Genitourinary: Positive for pelvic pain. Negative for dysuria.  Musculoskeletal: Negative.   Skin: Negative.   Neurological: Negative.     Allergies  Tramadol  Home Medications  No current outpatient prescriptions on file. BP 105/72  Pulse 78  Temp(Src) 98.6 F (37 C)  Resp 18  Ht 5\' 5"  (1.651 m)  Wt 163 lb (73.936 kg)  BMI 27.12 kg/m2  SpO2 98% Physical Exam  Constitutional: She is oriented to person, place, and time. She appears well-developed and well-nourished.  HENT:  Head: Normocephalic.  Neck: Normal range of motion. Neck supple.  Cardiovascular: Normal rate and regular rhythm.   Pulmonary/Chest: Effort normal and breath sounds normal.  Abdominal: Soft. Bowel sounds are normal. There is no tenderness. There is no rebound and no guarding.  Genitourinary: Vagina normal and uterus normal. No vaginal discharge found.  No adnexal mass. Pelvic tenderness on bimanual exam.   Musculoskeletal: Normal range of motion.  Neurological: She is alert and oriented to person, place, and time.  Skin: Skin is warm and dry. No rash noted.  Psychiatric: She has a normal mood and affect.    ED Course  Procedures (including  critical care time) Labs Review Labs Reviewed  WET PREP, GENITAL  GC/CHLAMYDIA PROBE AMP   Imaging Review US Pelvis Complete  04/11/2013   CLINICAL DATA:  Pelvic pain  EXAM: TRANSABDOMINAL ULTRASOUND OF PELVIS  TECHNIQUE: Transabdominal ultrasound examination of the pelvis was performed including evaluation of the uterus, ovaries, adnexal regions, and pelvic cul-de-sac.  COMPARISON:  None.  FINDINGS: Uterus  Measurements: 7.9 x 4.3 x 5.5 cm. No fibroids or other mass visualized.  Endometrium  Thickness: 8 mm.  No focal abnormality visualized.  Right  ovary  Measurements: 4.5 x 3.3 x 2.9 cm. Multiple follicles noted. No adnexal mass.  Left ovary  Measurements: Prior oophorectomy. Normal appearance/no adnexal mass.  Other findings:  No free fluid  IMPRESSION: Unremarkable study.   Electronically Signed   By: Charlett Nose M.D.   On: 04/11/2013 11:05    EKG Interpretation   None       MDM  No diagnosis found. 1. Chronic pelvic pain  No acute findings on exam and ultrasound today. Patient reports same pain as for the past year. She requests a referral to new GYN for second opinion.     Arnoldo Hooker, PA-C 04/11/13 1206

## 2013-04-11 NOTE — ED Notes (Signed)
Pt comfortable with d/c and f/u instructions. Prescriptions x2. 

## 2013-04-11 NOTE — ED Notes (Signed)
Pt. Is deaf, Meriam Sprague called for the deaf interrupter, they will be here at 09:15

## 2013-04-11 NOTE — ED Notes (Signed)
Pt. Had a cyst  And lt. Ovarian removal in February/2014.  Since the surgery, pt. Is having constant lt. Pelvic pain and intermittent vaginal bleeding and discharge. Denies any problems with urinary.  Denies any n/v/d

## 2013-04-11 NOTE — ED Notes (Signed)
Patient in ultrasound, will get a set of vitals on her return.

## 2013-04-11 NOTE — ED Notes (Signed)
Interrupter present with pt. During transport to [place].

## 2013-04-11 NOTE — ED Provider Notes (Signed)
Medical screening examination/treatment/procedure(s) were performed by non-physician practitioner and as supervising physician I was immediately available for consultation/collaboration.    Raquan Iannone R Aleatha Taite, MD 04/11/13 1214 

## 2013-05-23 DIAGNOSIS — J449 Chronic obstructive pulmonary disease, unspecified: Secondary | ICD-10-CM

## 2013-05-23 DIAGNOSIS — Z5189 Encounter for other specified aftercare: Secondary | ICD-10-CM

## 2013-05-23 HISTORY — DX: Encounter for other specified aftercare: Z51.89

## 2013-05-23 HISTORY — DX: Chronic obstructive pulmonary disease, unspecified: J44.9

## 2013-08-30 ENCOUNTER — Ambulatory Visit: Payer: Medicaid Other | Admitting: Family Medicine

## 2013-09-05 ENCOUNTER — Ambulatory Visit (INDEPENDENT_AMBULATORY_CARE_PROVIDER_SITE_OTHER): Payer: Medicaid Other | Admitting: Pulmonary Disease

## 2013-09-05 ENCOUNTER — Ambulatory Visit (INDEPENDENT_AMBULATORY_CARE_PROVIDER_SITE_OTHER)
Admission: RE | Admit: 2013-09-05 | Discharge: 2013-09-05 | Disposition: A | Discharge status: Home / Self Care | Payer: Medicaid Other | Source: Ambulatory Visit | Attending: Pulmonary Disease | Admitting: Pulmonary Disease

## 2013-09-05 ENCOUNTER — Encounter: Payer: Self-pay | Admitting: Pulmonary Disease

## 2013-09-05 ENCOUNTER — Telehealth: Payer: Self-pay | Admitting: Pulmonary Disease

## 2013-09-05 VITALS — BP 124/78 | HR 106 | Ht 68.0 in | Wt 183.0 lb

## 2013-09-05 DIAGNOSIS — R059 Cough, unspecified: Secondary | ICD-10-CM

## 2013-09-05 DIAGNOSIS — R05 Cough: Secondary | ICD-10-CM

## 2013-09-05 DIAGNOSIS — F17200 Nicotine dependence, unspecified, uncomplicated: Secondary | ICD-10-CM

## 2013-09-05 DIAGNOSIS — F32A Depression, unspecified: Secondary | ICD-10-CM

## 2013-09-05 DIAGNOSIS — R053 Chronic cough: Secondary | ICD-10-CM

## 2013-09-05 DIAGNOSIS — Z720 Tobacco use: Secondary | ICD-10-CM

## 2013-09-05 DIAGNOSIS — F329 Major depressive disorder, single episode, unspecified: Secondary | ICD-10-CM

## 2013-09-05 DIAGNOSIS — G4700 Insomnia, unspecified: Secondary | ICD-10-CM

## 2013-09-05 DIAGNOSIS — F3289 Other specified depressive episodes: Secondary | ICD-10-CM

## 2013-09-05 DIAGNOSIS — R062 Wheezing: Secondary | ICD-10-CM

## 2013-09-05 MED ORDER — ALPRAZOLAM 0.25 MG PO TABS: 0.2500 mg | ORAL_TABLET | Freq: Every evening | ORAL | Status: DC | PRN

## 2013-09-05 MED ORDER — ALBUTEROL SULFATE HFA 108 (90 BASE) MCG/ACT IN AERS: 2.0000 | INHALATION_SPRAY | Freq: Four times a day (QID) | RESPIRATORY_TRACT | Status: DC | PRN

## 2013-09-05 MED ORDER — BUPROPION HCL ER (SMOKING DET) 150 MG PO TB12: ORAL_TABLET | ORAL | Status: DC

## 2013-09-05 NOTE — Telephone Encounter (Signed)
Dg Chest 2 View  09/05/2013   CLINICAL DATA:  Chronic cough, wheezing  EXAM: CHEST  2 VIEW  COMPARISON:  None.  FINDINGS: The heart size and mediastinal contours are within normal limits. Both lungs are clear. Overlying artifact is projected over the lateral left mid lung. The visualized skeletal structures are unremarkable.  IMPRESSION: No active cardiopulmonary disease.   Electronically Signed   By: Abelardo Diesel M.D.   On: 09/05/2013 16:33    Will have my nurse inform pt that CXR is normal.  No change to current treatment plan.

## 2013-09-05 NOTE — Patient Instructions (Signed)
Bupropion 150 mg pill >> one pill daily for 3 days, then one pill twice daily.  Stop smoking after taking buproprion for one week. Alprazolam 0.25 mg pill at nighttime as needed to help sleep Proair two puffs up to four times per day for cough, wheeze, or chest congestion Chest xray today Will schedule breathing test (PFT) Will arrange for referral to [facility] Follow up in 1 month

## 2013-09-05 NOTE — Progress Notes (Deleted)
   Subjective:    Patient ID: Latasha Parker, female    DOB: 12-Dec-1975, 38 y.o.   MRN: [id number]  HPI    Review of Systems  Constitutional: Positive for appetite change and unexpected weight change. Negative for fever, chills, diaphoresis, activity change and fatigue.  HENT: Negative for congestion, dental problem, ear discharge, ear pain, facial swelling, hearing loss, mouth sores, nosebleeds, postnasal drip, rhinorrhea, sinus pressure, sneezing, sore throat, tinnitus, trouble swallowing and voice change.   Eyes: Negative for photophobia, discharge, itching and visual disturbance.  Respiratory: Negative for apnea, cough, choking, chest tightness, shortness of breath, wheezing and stridor.   Cardiovascular: Negative for chest pain, palpitations and leg swelling.  Gastrointestinal: Positive for abdominal pain. Negative for nausea, vomiting, constipation, blood in stool and abdominal distention.  Genitourinary: Negative for dysuria, urgency, frequency, hematuria, flank pain, decreased urine volume and difficulty urinating.  Musculoskeletal: Negative for arthralgias, back pain, gait problem, joint swelling, myalgias, neck pain and neck stiffness.  Skin: Negative for color change, pallor and rash.  Neurological: Positive for headaches. Negative for dizziness, tremors, seizures, syncope, speech difficulty, weakness, light-headedness and numbness.  Hematological: Negative for adenopathy. Does not bruise/bleed easily.  Psychiatric/Behavioral: Negative for confusion, sleep disturbance and agitation. The patient is not nervous/anxious.        Objective:   Physical Exam        Assessment & Plan:
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
## 2013-09-06 NOTE — Telephone Encounter (Signed)
Pt called back through a interpreter service. She is aware of her results.

## 2013-09-06 NOTE — Telephone Encounter (Signed)
lmtcb x1 

## 2013-09-09 DIAGNOSIS — J449 Chronic obstructive pulmonary disease, unspecified: Secondary | ICD-10-CM | POA: Insufficient documentation

## 2013-09-09 DIAGNOSIS — Z720 Tobacco use: Secondary | ICD-10-CM | POA: Insufficient documentation

## 2013-09-09 DIAGNOSIS — G4700 Insomnia, unspecified: Secondary | ICD-10-CM | POA: Insufficient documentation

## 2013-09-09 DIAGNOSIS — J4489 Other specified chronic obstructive pulmonary disease: Secondary | ICD-10-CM | POA: Insufficient documentation

## 2013-09-09 NOTE — Assessment & Plan Note (Signed)
Had detailed discussion about the importance of smoking cessation.  She is ready to try to quit.  Will have her use bupropion.  Discussed dosing regimen, setting quit date, and common side effects to monitor for.

## 2013-09-09 NOTE — Assessment & Plan Note (Signed)
Likely related to cigarette smoking.  Will have her try prn albuterol.  Will arrange for PFT's and CXR to further assess.

## 2013-09-09 NOTE — Assessment & Plan Note (Addendum)
Her main issue with sleep is likely related to mood disorder with depression.  She denies recent suicidal ideation.  She realizes she needs help, and is willing to look into this.  I will arrange for referral to behavioral health.    Explained how bupropion for smoking cessation may have dual benefit with her mood.  Will also give her script for alprazolam >> advised her that she would need to d/w behavioral help if she felt she needed further refills of this.  Also reviewed proper sleep hygiene, stimulus control, relaxation techniques, sleep restriction, and proper expectations from sleep.

## 2013-09-26 ENCOUNTER — Ambulatory Visit: Payer: Medicaid Other | Admitting: Medical

## 2013-10-09 ENCOUNTER — Ambulatory Visit (INDEPENDENT_AMBULATORY_CARE_PROVIDER_SITE_OTHER): Payer: Medicaid Other | Admitting: Pulmonary Disease

## 2013-10-09 ENCOUNTER — Encounter: Payer: Self-pay | Admitting: Pulmonary Disease

## 2013-10-09 VITALS — BP 110/70 | HR 72 | Ht 68.0 in | Wt 181.0 lb

## 2013-10-09 DIAGNOSIS — Z0000 Encounter for general adult medical examination without abnormal findings: Secondary | ICD-10-CM

## 2013-10-09 DIAGNOSIS — M25561 Pain in right knee: Secondary | ICD-10-CM | POA: Insufficient documentation

## 2013-10-09 DIAGNOSIS — F32A Depression, unspecified: Secondary | ICD-10-CM

## 2013-10-09 DIAGNOSIS — Z720 Tobacco use: Secondary | ICD-10-CM

## 2013-10-09 DIAGNOSIS — F17200 Nicotine dependence, unspecified, uncomplicated: Secondary | ICD-10-CM

## 2013-10-09 DIAGNOSIS — F3289 Other specified depressive episodes: Secondary | ICD-10-CM

## 2013-10-09 DIAGNOSIS — M25569 Pain in unspecified knee: Secondary | ICD-10-CM

## 2013-10-09 DIAGNOSIS — R062 Wheezing: Secondary | ICD-10-CM

## 2013-10-09 DIAGNOSIS — G4700 Insomnia, unspecified: Secondary | ICD-10-CM

## 2013-10-09 DIAGNOSIS — F329 Major depressive disorder, single episode, unspecified: Secondary | ICD-10-CM

## 2013-10-09 MED ORDER — ALPRAZOLAM 0.25 MG PO TABS: 0.2500 mg | ORAL_TABLET | Freq: Every evening | ORAL | Status: DC | PRN

## 2013-10-09 NOTE — Assessment & Plan Note (Signed)
Will arrange for referral to primary care.

## 2013-10-09 NOTE — Assessment & Plan Note (Signed)
She has made some progress with smoking cessation with bupropion.  Will continue this for now.

## 2013-10-09 NOTE — Progress Notes (Signed)
Chief Complaint  Patient presents with  . Follow-up    Pt states that she has been sleeping well. Daughter is making her nervous. Has not stop smoking.    History of Present Illness: Latasha Parker is a 38 y.o. female smoker with insomnia and wheezing.  She takes xanax nightly, and has been sleeping better.  She tried skipping this, but was not able to sleep.  She has not kept appointment with behavioral health or PFT.  She is down to 1/2 pack cigarettes per day.  She feels bupropion has helped.    She is not having as much cough or wheeze.  She uses proair sporadically, and this helps.  She has noticed intermittent Rt knee pain and swelling.  TESTS: CXR 09/05/13 >> no acute disease  Latasha Parker  has a past medical history of Deaf; SVD (spontaneous vaginal delivery); Hyperthyroidism; Menorrhagia; and Pelvic pain.  Latasha Parker  has past surgical history that includes ovary tumor; Foot surgery; Diagnostic laparoscopy (2003); Wisdom tooth extraction; and laparoscopy (N/A, 07/17/2012).  Prior to Admission medications   Medication Sig Start Date End Date Taking? Authorizing Provider  albuterol (PROAIR HFA) 108 (90 BASE) MCG/ACT inhaler Inhale 2 puffs into the lungs every 6 (six) hours as needed for wheezing or shortness of breath. 09/05/13  Yes Chesley Mires, MD  ALPRAZolam Duanne Moron) 0.25 MG tablet Take 1 tablet (0.25 mg total) by mouth at bedtime as needed for anxiety. 09/05/13  Yes Chesley Mires, MD  buPROPion (ZYBAN) 150 MG 12 hr tablet One pill daily for 3 days, then one pill twice per day 09/05/13  Yes Chesley Mires, MD    Allergies  Allergen Reactions  . Tramadol Nausea And Vomiting     Physical Exam:  General - No distress ENT - No sinus tenderness, no oral exudate, no LAN Cardiac - s1s2 regular, no murmur Chest - No wheeze/rales/dullness Back - No focal tenderness Abd - Soft, non-tender Ext - No edema Neuro - Normal strength Skin - No rashes Psych - normal mood, and  behavior   Assessment/Plan:  Chesley Mires, MD [facility] Pager:  [phone]

## 2013-10-09 NOTE — Assessment & Plan Note (Signed)
Will reschedule PFTs.  She is to continue prn proair.

## 2013-10-09 NOTE — Assessment & Plan Note (Signed)
Improved with addition of xanax.  Advised her to keep referral to behavioral health to further assess for depression/anxiety.

## 2013-10-09 NOTE — Assessment & Plan Note (Signed)
Will defer to assessment by primary care.  May need evaluation by orthopedics.

## 2013-10-09 NOTE — Patient Instructions (Signed)
Will reschedule appointment with behavioral health Will arrange for referral to primary care Will reschedule breathing test (PFT) Follow up in 3 months

## 2013-11-08 ENCOUNTER — Encounter: Payer: Self-pay | Admitting: Obstetrics & Gynecology

## 2013-11-08 ENCOUNTER — Ambulatory Visit (INDEPENDENT_AMBULATORY_CARE_PROVIDER_SITE_OTHER): Payer: Medicaid Other | Admitting: Obstetrics & Gynecology

## 2013-11-08 VITALS — BP 104/73 | HR 53 | Temp 98.6°F | Ht 68.0 in | Wt 170.4 lb

## 2013-11-08 DIAGNOSIS — N920 Excessive and frequent menstruation with regular cycle: Secondary | ICD-10-CM

## 2013-11-08 DIAGNOSIS — N946 Dysmenorrhea, unspecified: Secondary | ICD-10-CM

## 2013-11-08 NOTE — Patient Instructions (Addendum)
Dysmenorrhea Menstrual cramps (dysmenorrhea) are caused by the muscles of the uterus tightening (contracting) during a menstrual period. For some women, this discomfort is merely bothersome. For others, dysmenorrhea can be severe enough to interfere with everyday activities for a few days each month. Primary dysmenorrhea is menstrual cramps that last a couple of days when you start having menstrual periods or soon after. This often begins after a teenager starts having her period. As a woman gets older or has a baby, the cramps will usually lessen or disappear. Secondary dysmenorrhea begins later in life, lasts longer, and the pain may be stronger than primary dysmenorrhea. The pain may start before the period and last a few days after the period.  CAUSES  Dysmenorrhea is usually caused by an underlying problem, such as:  The tissue lining the uterus grows outside of the uterus in other areas of the body (endometriosis).  The endometrial tissue, which normally lines the uterus, is found in or grows into the muscular walls of the uterus (adenomyosis).  The pelvic blood vessels are engorged with blood just before the menstrual period (pelvic congestive syndrome).  Overgrowth of cells (polyps) in the lining of the uterus or cervix.  Falling down of the uterus (prolapse) because of loose or stretched ligaments.  Depression.  Bladder problems, infection, or inflammation.  Problems with the intestine, a tumor, or irritable bowel syndrome.  Cancer of the female organs or bladder.  A severely tipped uterus.  A very tight opening or closed cervix.  Noncancerous tumors of the uterus (fibroids).  Pelvic inflammatory disease (PID).  Pelvic scarring (adhesions) from a previous surgery.  Ovarian cyst.  An intrauterine device (IUD) used for birth control. RISK FACTORS You may be at greater risk of dysmenorrhea if:  You are younger than age 62.  You started puberty early.  You have  irregular or heavy bleeding.  You have never given birth.  You have a family history of this problem.  You are a smoker. SIGNS AND SYMPTOMS   Cramping or throbbing pain in your lower abdomen.  Headaches.  Lower back pain.  Nausea or vomiting.  Diarrhea.  Sweating or dizziness.  Loose stools. DIAGNOSIS  A diagnosis is based on your history, symptoms, physical exam, diagnostic tests, or procedures. Diagnostic tests or procedures may include:  Blood tests.  Ultrasonography.  An examination of the lining of the uterus (dilation and curettage, D&C).  An examination inside your abdomen or pelvis with a scope (laparoscopy).  X-rays.  CT scan.  MRI.  An examination inside the bladder with a scope (cystoscopy).  An examination inside the intestine or stomach with a scope (colonoscopy, gastroscopy). TREATMENT  Treatment depends on the cause of the dysmenorrhea. Treatment may include:  Pain medicine prescribed by your health care provider.  Birth control pills or an IUD with progesterone hormone in it.  Hormone replacement therapy.  Nonsteroidal anti-inflammatory drugs (NSAIDs). These may help stop the production of prostaglandins.  Surgery to remove adhesions, endometriosis, ovarian cyst, or fibroids.  Removal of the uterus (hysterectomy).  Progesterone shots to stop the menstrual period.  Cutting the nerves on the sacrum that go to the female organs (presacral neurectomy).  Electric current to the sacral nerves (sacral nerve stimulation).  Antidepressant medicine.  Psychiatric therapy, counseling, or group therapy.  Exercise and physical therapy.  Meditation and yoga therapy.  Acupuncture. HOME CARE INSTRUCTIONS   Only take over-the-counter or prescription medicines as directed by your health care provider.  Place a heating pad  or hot water bottle on your lower back or abdomen. Do not sleep with the heating pad.  Use aerobic exercises, walking,  swimming, biking, and other exercises to help lessen the cramping.  Massage to the lower back or abdomen may help.  Stop smoking.  Avoid alcohol and caffeine. SEEK MEDICAL CARE IF:   Your pain does not get better with medicine.  You have pain with sexual intercourse.  Your pain increases and is not controlled with medicines.  You have abnormal vaginal bleeding with your period.  You develop nausea or vomiting with your period that is not controlled with medicine. SEEK IMMEDIATE MEDICAL CARE IF:  You pass out.  Document Released: 05/09/2005 Document Revised: 01/09/2013 Document Reviewed: 10/25/2012 [employer] Patient Information 2015 [employer]. This information is not intended to replace advice given to you by your health care provider. Make sure you discuss any questions you have with your health care provider. Menorrhagia Menorrhagia is the medical term for when your menstrual periods are heavy or last longer than usual. With menorrhagia, every period you have may cause enough blood loss and cramping that you are unable to maintain your usual activities. CAUSES  In some cases, the cause of heavy periods is unknown, but a number of conditions may cause menorrhagia. Common causes include:  A problem with the hormone-producing thyroid gland (hypothyroid).  Noncancerous growths in the uterus (polyps or fibroids).  An imbalance of the estrogen and progesterone hormones.  One of your ovaries not releasing an egg during one or more months.  Side effects of having an intrauterine device (IUD).  Side effects of some medicines, such as anti-inflammatory medicines or blood thinners.  A bleeding disorder that stops your blood from clotting normally. SIGNS AND SYMPTOMS  During a normal period, bleeding lasts between 4 and 8 days. Signs that your periods are too heavy include:  You routinely have to change your pad or tampon every 1 or 2 hours because it is completely soaked.  You  pass blood clots larger than 1 inch (2.5 cm) in size.  You have bleeding for more than 7 days.  You need to use pads and tampons at the same time because of heavy bleeding.  You need to wake up to change your pads or tampons during the night.  You have symptoms of anemia, such as tiredness, fatigue, or shortness of breath. DIAGNOSIS  Your health care provider will perform a physical exam and ask you questions about your symptoms and menstrual history. Other tests may be ordered based on what the health care provider finds during the exam. These tests can include:  Blood tests. Blood tests are used to check if you are pregnant or have hormonal changes, a bleeding or thyroid disorder, low iron levels (anemia), or other problems.  Endometrial biopsy. Your health care provider takes a sample of tissue from the inside of your uterus to be examined under a microscope.  Pelvic ultrasound. This test uses sound waves to make a picture of your uterus, ovaries, and vagina. The pictures can show if you have fibroids or other growths.  Hysteroscopy. For this test, your health care provider will use a small telescope to look inside your uterus. Based on the results of your initial tests, your health care provider may recommend further testing. TREATMENT  Treatment may not be needed. If it is needed, your health care provider may recommend treatment with one or more medicines first. If these do not reduce bleeding enough, a surgical treatment   might be an option. The best treatment for you will depend on:   Whether you need to prevent pregnancy.  Your desire to have children in the future.  The cause and severity of your bleeding.  Your opinion and personal preference.  Medicines for menorrhagia may include:  Birth control methods that use hormones. These include the pill, skin patch, vaginal ring, shots that you get every 3 months, hormonal IUD, and implant. These treatments reduce bleeding  during your menstrual period.  Medicines that thicken blood and slow bleeding.  Medicines that reduce swelling, such as ibuprofen.  Medicines that contain a synthetic hormone called progestin.   Medicines that make the ovaries stop working for a short time.  You may need surgical treatment for menorrhagia if the medicines are unsuccessful. Treatment options include:  Dilation and curettage (D&C). In this procedure, your health care provider opens (dilates) your cervix and then scrapes or suctions tissue from the lining of your uterus to reduce menstrual bleeding.  Operative hysteroscopy. This procedure uses a tiny tube with a light (hysteroscope) to view your uterine cavity and can help in the surgical removal of a polyp that may be causing heavy periods.  Endometrial ablation. Through various techniques, your health care provider permanently destroys the entire lining of your uterus (endometrium). After endometrial ablation, most women have little or no menstrual flow. Endometrial ablation reduces your ability to become pregnant.  Endometrial resection. This surgical procedure uses an electrosurgical wire loop to remove the lining of the uterus. This procedure also reduces your ability to become pregnant.  Hysterectomy. Surgical removal of the uterus and cervix is a permanent procedure that stops menstrual periods. Pregnancy is not possible after a hysterectomy. This procedure requires anesthesia and hospitalization. HOME CARE INSTRUCTIONS   Only take over-the-counter or prescription medicines as directed by your health care provider. Take prescribed medicines exactly as directed. Do not change or switch medicines without consulting your health care provider.  Take any prescribed iron pills exactly as directed by your health care provider. Long-term heavy bleeding may result in low iron levels. Iron pills help replace the iron your body lost from heavy bleeding. Iron may cause  constipation. If this becomes a problem, increase the bran, fruits, and roughage in your diet.  Do not take aspirin or medicines that contain aspirin 1 week before or during your menstrual period. Aspirin may make the bleeding worse.  If you need to change your sanitary pad or tampon more than once every 2 hours, stay in bed and rest as much as possible until the bleeding stops.  Eat well-balanced meals. Eat foods high in iron. Examples are leafy green vegetables, meat, liver, eggs, and whole grain breads and cereals. Do not try to lose weight until the abnormal bleeding has stopped and your blood iron level is back to normal. SEEK MEDICAL CARE IF:   You soak through a pad or tampon every 1 or 2 hours, and this happens every time you have a period.  You need to use pads and tampons at the same time because you are bleeding so much.  You need to change your pad or tampon during the night.  You have a period that lasts for more than 8 days.  You pass clots bigger than 1 inch wide.  You have irregular periods that happen more or less often than once a month.  You feel dizzy or faint.  You feel very weak or tired.  You feel short of breath or   feel your heart is beating too fast when you exercise.  You have nausea and vomiting or diarrhea while you are taking your medicine.  You have any problems that may be related to the medicine you are taking. SEEK IMMEDIATE MEDICAL CARE IF:   You soak through 4 or more pads or tampons in 2 hours.  You have any bleeding while you are pregnant. MAKE SURE YOU:   Understand these instructions.  Will watch your condition.  Will get help right away if you are not doing well or get worse. Document Released: 05/09/2005 Document Revised: 05/14/2013 Document Reviewed: 10/28/2012 [facility] Patient Information 2015 [place], [place]. This information is not intended to replace advice given to you by your health care provider. Make sure you discuss any  questions you have with your health care provider. Endometrial Ablation Endometrial ablation removes the lining of the uterus (endometrium). It is usually a same-day, outpatient treatment. Ablation helps avoid major surgery, such as surgery to remove the cervix and uterus (hysterectomy). After endometrial ablation, you will have little or no menstrual bleeding and may not be able to have children. However, if you are premenopausal, you will need to use a reliable method of birth control following the procedure because of the small chance that pregnancy can occur. There are different reasons to have this procedure, which include:  Heavy periods.  Bleeding that is causing anemia.  Irregular bleeding.  Bleeding fibroids on the lining inside the uterus if they are smaller than 3 centimeters. This procedure should not be done if:  You want children in the future.  You have severe cramps with your menstrual period.  You have precancerous or cancerous cells in your uterus.  You were recently pregnant.  You have gone through menopause.  You have had major surgery on the uterus, such as a cesarean delivery. LET [facility] CARE PROVIDER KNOW ABOUT:  Any allergies you have.  All medicines you are taking, including vitamins, herbs, eye drops, creams, and over-the-counter medicines.  Previous problems you or members of your family have had with the use of anesthetics.  Any blood disorders you have.  Previous surgeries you have had.  Medical conditions you have. RISKS AND COMPLICATIONS  Generally, this is a safe procedure. However, as with any procedure, complications can occur. Possible complications include:  Perforation of the uterus.  Bleeding.  Infection of the uterus, bladder, or vagina.  Injury to surrounding organs.  An air bubble to the lung (air embolus).  Pregnancy following the procedure.  Failure of the procedure to help the problem, requiring  hysterectomy.  Decreased ability to diagnose cancer in the lining of the uterus. BEFORE THE PROCEDURE  The lining of the uterus must be tested to make sure there is no pre-cancerous or cancer cells present.  An ultrasound may be performed to look at the size of the uterus and to check for abnormalities.  Medicines may be given to thin the lining of the uterus. PROCEDURE  During the procedure, your health care provider will use a tool called a resectoscope to help see inside your uterus. There are different ways to remove the lining of your uterus.   Radiofrequency - This method uses a radiofrequency-alternating electric current to remove the lining of the uterus.  Cryotherapy - This method uses extreme cold to freeze the lining of the uterus.  Heated-Free Liquid - This method uses heated salt (saline) solution to remove the lining of the uterus.  Microwave - This method uses high-energy  microwaves to heat up the lining of the uterus to remove it.  Thermal balloon - This method involves inserting a catheter with a balloon tip into the uterus. The balloon tip is filled with heated fluid to remove the lining of the uterus. AFTER THE PROCEDURE  After your procedure, do not have sexual intercourse or insert anything into your vagina until permitted by your health care provider. After the procedure, you may experience:  Cramps.  Vaginal discharge.  Frequent urination. Document Released: 03/18/2004 Document Revised: 01/09/2013 Document Reviewed: 10/10/2012 [facility] Patient Information 2015 [place], [place]. This information is not intended to replace advice given to you by your health care provider. Make sure you discuss any questions you have with your health care provider. Levonorgestrel intrauterine device (IUD) What is this medicine? LEVONORGESTREL IUD (LEE voe nor jes trel) is a contraceptive (birth control) device. The device is placed inside the uterus by a healthcare professional.  It is used to prevent pregnancy and can also be used to treat heavy bleeding that occurs during your period. Depending on the device, it can be used for 3 to 5 years. This medicine may be used for other purposes; ask your health care provider or pharmacist if you have questions. COMMON BRAND NAME(S): Jerral Bonito What should I tell my health care provider before I take this medicine? They need to know if you have any of these conditions: -abnormal Pap smear -cancer of the breast, uterus, or cervix -diabetes -endometritis -genital or pelvic infection now or in the past -have more than one sexual partner or your partner has more than one partner -heart disease -history of an ectopic or tubal pregnancy -immune system problems -IUD in place -liver disease or tumor -problems with blood clots or take blood-thinners -use intravenous drugs -uterus of unusual shape -vaginal bleeding that has not been explained -an unusual or allergic reaction to levonorgestrel, other hormones, silicone, or polyethylene, medicines, foods, dyes, or preservatives -pregnant or trying to get pregnant -breast-feeding How should I use this medicine? This device is placed inside the uterus by a health care professional. Talk to your pediatrician regarding the use of this medicine in children. Special care may be needed. Overdosage: If you think you have taken too much of this medicine contact a poison control center or emergency room at once. NOTE: This medicine is only for you. Do not share this medicine with others. What if I miss a dose? This does not apply. What may interact with this medicine? Do not take this medicine with any of the following medications: -amprenavir -bosentan -fosamprenavir This medicine may also interact with the following medications: -aprepitant -barbiturate medicines for inducing sleep or treating seizures -bexarotene -griseofulvin -medicines to treat seizures like carbamazepine,  ethotoin, felbamate, oxcarbazepine, phenytoin, topiramate -modafinil -pioglitazone -rifabutin -rifampin -rifapentine -some medicines to treat HIV infection like atazanavir, indinavir, lopinavir, nelfinavir, tipranavir, ritonavir -St. John's wort -warfarin This list may not describe all possible interactions. Give your health care provider a list of all the medicines, herbs, non-prescription drugs, or dietary supplements you use. Also tell them if you smoke, drink alcohol, or use illegal drugs. Some items may interact with your medicine. What should I watch for while using this medicine? Visit your doctor or health care professional for regular check ups. See your doctor if you or your partner has sexual contact with others, becomes HIV positive, or gets a sexual transmitted disease. This product does not protect you against HIV infection (AIDS) or other sexually transmitted diseases. You can  check the placement of the IUD yourself by reaching up to the top of your vagina with clean fingers to feel the threads. Do not pull on the threads. It is a good habit to check placement after each menstrual period. Call your doctor right away if you feel more of the IUD than just the threads or if you cannot feel the threads at all. The IUD may come out by itself. You may become pregnant if the device comes out. If you notice that the IUD has come out use a backup birth control method like condoms and call your health care provider. Using tampons will not change the position of the IUD and are okay to use during your period. What side effects may I notice from receiving this medicine? Side effects that you should report to your doctor or health care professional as soon as possible: -allergic reactions like skin rash, itching or hives, swelling of the face, lips, or tongue -fever, flu-like symptoms -genital sores -high blood pressure -no menstrual period for 6 weeks during use -pain, swelling, warmth in  the leg -pelvic pain or tenderness -severe or sudden headache -signs of pregnancy -stomach cramping -sudden shortness of breath -trouble with balance, talking, or walking -unusual vaginal bleeding, discharge -yellowing of the eyes or skin Side effects that usually do not require medical attention (report to your doctor or health care professional if they continue or are bothersome): -acne -breast pain -change in sex drive or performance -changes in weight -cramping, dizziness, or faintness while the device is being inserted -headache -irregular menstrual bleeding within first 3 to 6 months of use -nausea This list may not describe all possible side effects. Call your doctor for medical advice about side effects. You may report side effects to FDA at 1-800-FDA-1088. Where should I keep my medicine? This does not apply. NOTE: This sheet is a summary. It may not cover all possible information. If you have questions about this medicine, talk to your doctor, pharmacist, or health care provider.  2015, [employer]. (2011-06-09 13:54:04)

## 2013-11-08 NOTE — Progress Notes (Signed)
Subjective:     Patient ID: Latasha Parker, female   DOB: 14-Nov-1975, 38 y.o.   MRN: [id number]  HPI Pt c/o heavy bleeding.  She also c/o pain occ.  Menses 3-4 days but, heavy.  Pt denies intermenstrual bleeding. Pt is a smoker and cannot take birth control pills.     Review of Systems     Objective:   Physical Exam BP 104/73  Pulse 53  Temp(Src) 98.6 F (37 C) (Oral)  Ht 5\' 8"  (1.727 m)  Wt 170 lb 6.4 oz (77.293 kg)  BMI 25.92 kg/m2  LMP 11/08/2013 Pt declined exam    04/11/2013 CLINICAL DATA: Pelvic pain  EXAM:  TRANSABDOMINAL ULTRASOUND OF PELVIS  TECHNIQUE:  Transabdominal ultrasound examination of the pelvis was performed  including evaluation of the uterus, ovaries, adnexal regions, and  pelvic cul-de-sac.  COMPARISON: None.  FINDINGS:  Uterus  Measurements: 7.9 x 4.3 x 5.5 cm. No fibroids or other mass  visualized.  Endometrium  Thickness: 8 mm. No focal abnormality visualized.  Right ovary  Measurements: 4.5 x 3.3 x 2.9 cm. Multiple follicles noted. No  adnexal mass.  Left ovary  Measurements: Prior oophorectomy. Normal appearance/no adnexal mass.  Other findings: No free fluid  IMPRESSION:  Unremarkable study.  Assessment:     menorrhagia - d/w pt treatment options including Depo Provera, Mirena and endometrial ablation     Plan:     Pt will f/u for IUD in 2 weeks or sooner prn

## 2013-12-02 ENCOUNTER — Ambulatory Visit: Payer: Self-pay | Admitting: Obstetrics & Gynecology

## 2014-01-07 ENCOUNTER — Other Ambulatory Visit: Payer: Self-pay | Admitting: Pulmonary Disease

## 2014-01-07 ENCOUNTER — Telehealth: Payer: Self-pay | Admitting: Pulmonary Disease

## 2014-01-07 ENCOUNTER — Ambulatory Visit: Payer: Self-pay | Admitting: Adult Health

## 2014-01-07 NOTE — Telephone Encounter (Signed)
lmomtcb x1 

## 2014-01-08 MED ORDER — ALPRAZOLAM 0.25 MG PO TABS: 0.2500 mg | ORAL_TABLET | Freq: Every evening | ORAL | Status: DC | PRN

## 2014-01-08 NOTE — Telephone Encounter (Signed)
Pt returning call.Latasha Parker ° °

## 2014-01-08 NOTE — Telephone Encounter (Signed)
Called and spoke to pt. Pt stated she needed a refill on her Xanax 0.25mg . Last refilled on 10/09/13 for #30 with 2 refills. Pt last seen on 10/09/13. Pt has upcoming appt on 01/21/2014 with TP and pt stated she did not want to wait till then to have the xanax refilled. Pharmacy verified- [facility] on [address], [place].   VS please advise if ok to fill xanax.  Allergies  Allergen Reactions  . Tramadol Nausea And Vomiting

## 2014-01-08 NOTE — Telephone Encounter (Signed)
Pt aware that Rx has been called to [place] [facility]. Nothing more needed at this time.

## 2014-01-08 NOTE — Telephone Encounter (Signed)
Okay to send refill for xanax 0.25 mg qhs prn, dispense 30 pills with 2 refills.

## 2014-01-08 NOTE — Telephone Encounter (Signed)
LMTCB

## 2014-01-21 ENCOUNTER — Encounter: Payer: Self-pay | Admitting: Adult Health

## 2014-01-21 ENCOUNTER — Ambulatory Visit (INDEPENDENT_AMBULATORY_CARE_PROVIDER_SITE_OTHER): Payer: Medicaid Other | Admitting: Adult Health

## 2014-01-21 ENCOUNTER — Encounter (INDEPENDENT_AMBULATORY_CARE_PROVIDER_SITE_OTHER): Payer: Self-pay

## 2014-01-21 ENCOUNTER — Ambulatory Visit (INDEPENDENT_AMBULATORY_CARE_PROVIDER_SITE_OTHER): Payer: Medicaid Other | Admitting: Pulmonary Disease

## 2014-01-21 VITALS — BP 106/64 | HR 88 | Temp 98.5°F | Ht 67.0 in | Wt 169.0 lb

## 2014-01-21 DIAGNOSIS — R05 Cough: Secondary | ICD-10-CM

## 2014-01-21 DIAGNOSIS — R059 Cough, unspecified: Secondary | ICD-10-CM

## 2014-01-21 DIAGNOSIS — R062 Wheezing: Secondary | ICD-10-CM

## 2014-01-21 DIAGNOSIS — R053 Chronic cough: Secondary | ICD-10-CM

## 2014-01-21 LAB — PULMONARY FUNCTION TEST
DL/VA % pred: 78 %
DL/VA: 4.04 ml/min/mmHg/L
DLCO UNC % PRED: 85 %
DLCO UNC: 24.12 ml/min/mmHg
FEF 25-75 PRE: 1.96 L/s
FEF 25-75 Post: 2.57 L/sec
FEF2575-%Change-Post: 31 %
FEF2575-%PRED-POST: 74 %
FEF2575-%Pred-Pre: 57 %
FEV1-%CHANGE-POST: 5 %
FEV1-%Pred-Post: 102 %
FEV1-%Pred-Pre: 97 %
FEV1-PRE: 3.29 L
FEV1-Post: 3.46 L
FEV1FVC-%Change-Post: 3 %
FEV1FVC-%PRED-PRE: 82 %
FEV6-%Change-Post: 2 %
FEV6-%Pred-Post: 119 %
FEV6-%Pred-Pre: 116 %
FEV6-POST: 4.82 L
FEV6-Pre: 4.7 L
FEV6FVC-%CHANGE-POST: 0 %
FEV6FVC-%PRED-POST: 101 %
FEV6FVC-%Pred-Pre: 100 %
FVC-%CHANGE-POST: 1 %
FVC-%PRED-PRE: 116 %
FVC-%Pred-Post: 118 %
FVC-Post: 4.86 L
FVC-Pre: 4.78 L
PRE FEV1/FVC RATIO: 69 %
Post FEV1/FVC ratio: 71 %
Post FEV6/FVC ratio: 99 %
Pre FEV6/FVC Ratio: 98 %
RV % pred: 19 %
RV: 0.32 L
TLC % pred: 94 %
TLC: 5.19 L

## 2014-01-21 MED ORDER — BECLOMETHASONE DIPROPIONATE 80 MCG/ACT IN AERS: 2.0000 | INHALATION_SPRAY | Freq: Two times a day (BID) | RESPIRATORY_TRACT | Status: DC

## 2014-01-21 NOTE — Progress Notes (Signed)
   Subjective:    Patient ID: Latasha Parker, female    DOB: Jan 10, 1976, 38 y.o.   MRN: [id number]  HPI 38 yo smoker with insomnia and wheezing with initial pulmonary consult 08/2013   01/21/2014 Follow up  Patient returns for a followup and review test results. Patient was seen a few months ago, for insomnia, and wheezing. She is taking Xanax, which does help her rest better. She does have some intermittent wheezing, and cough on and off. Unfortunately, she continues to smoke. We discussed smoking cessation. Today. She underwent a pulmonary function tests that showed an FEV1 of 97%, ratio 69, no significant bronchodilator response. FVC was normal. Diffusing capacity was normal. Mid-flows were decreased at 57% with a positive bronchodilator response. Patient denies any hemoptysis, orthopnea, PND, leg swelling, weight loss, or fever.    TESTS: CXR 09/05/13 >> no acute disease    Review of Systems Constitutional:   No  weight loss, night sweats,  Fevers, chills, fatigue, or  lassitude.  HEENT:   No headaches,  Difficulty swallowing,  Tooth/dental problems, or  Sore throat,                No sneezing, itching, ear ache,  +nasal congestion, post nasal drip,   CV:  No chest pain,  Orthopnea, PND, swelling in lower extremities, anasarca, dizziness, palpitations, syncope.   GI  No heartburn, indigestion, abdominal pain, nausea, vomiting, diarrhea, change in bowel habits, loss of appetite, bloody stools.   Resp:    No chest wall deformity  Skin: no rash or lesions.  GU: no dysuria, change in color of urine, no urgency or frequency.  No flank pain, no hematuria   MS:  No joint pain or swelling.  No decreased range of motion.  No back pain.  Psych:  No change in mood or affect. No depression or anxiety.  No memory loss.         Objective:   Physical Exam GEN: A/Ox3; pleasant , NAD,  Deaf-interpreter present   HEENT:  [place]/AT,  EACs-clear, TMs-wnl, NOSE-clear, THROAT-clear, no  lesions, no postnasal drip or exudate noted.   NECK:  Supple w/ fair ROM; no JVD; normal carotid impulses w/o bruits; no thyromegaly or nodules palpated; no lymphadenopathy.  RESP  Clear  P & A; w/o, wheezes/ rales/ or rhonchi.no accessory muscle use, no dullness to percussion  CARD:  RRR, no m/r/g  , no peripheral edema, pulses intact, no cyanosis or clubbing.  GI:   Soft & nt; nml bowel sounds; no organomegaly or masses detected.  Musco: Warm bil, no deformities or joint swelling noted.   Neuro: alert, no focal deficits noted.    Skin: Warm, no lesions or rashes         Assessment & Plan:

## 2014-01-21 NOTE — Assessment & Plan Note (Addendum)
Suspect wheezing is secondary to reactive airways/asthma in an active smoker.  PFTs showed preserved lung function despite ongoing smoking  Patient was advised on smoking cessation.  Plan  Trial of ICS- Most important goal is to quit smoking .  Trial of QVAR 55mcg 2 puffs Twice daily  , rinse after use.  Follow up Dr. Halford Chessman  In 6-8 weeks and As needed

## 2014-01-21 NOTE — Patient Instructions (Signed)
Most important goal is to quit smoking .  Trial of QVAR 68mcg 2 puffs Twice daily  , rinse after use.  Follow up Dr. Halford Chessman  In 6-8 weeks and As needed

## 2014-01-21 NOTE — Progress Notes (Signed)
Reviewed and agree with assessment/plan. 

## 2014-01-21 NOTE — Progress Notes (Signed)
PFT done today. 

## 2014-01-23 ENCOUNTER — Ambulatory Visit: Payer: Self-pay | Admitting: Pulmonary Disease

## 2014-01-23 ENCOUNTER — Telehealth: Payer: Self-pay | Admitting: Pulmonary Disease

## 2014-01-23 MED ORDER — ALPRAZOLAM 0.25 MG PO TABS: 0.2500 mg | ORAL_TABLET | Freq: Every evening | ORAL | Status: DC | PRN

## 2014-01-23 NOTE — Telephone Encounter (Signed)
Okay to increase dose of xanax.  Please advise pt that she needs to f/u with behavioral health for further assessment of her anxiety, and any future refills will need to be done through her PCP or behavioral health.

## 2014-01-23 NOTE — Telephone Encounter (Signed)
lmtcb

## 2014-01-23 NOTE — Telephone Encounter (Signed)
[email or web address].Anija Brickner, CMA  

## 2014-01-23 NOTE — Telephone Encounter (Signed)
I spoke with the pt via Relay and she states at last visit with TP she spoke with her about her xanax rx. It is currently written for xanax 0.25mg  take 1 tab qhs prn. Last rx was written on 01-08-14 for # 30 x 2. The pt states she has been taking 2 tablets at night because 1 tablet did not help her sleep. She is asking can rx be written to reflect this as well as the quantity. Please advise. [place], CMA Allergies  Allergen Reactions  . Tramadol Nausea And Vomiting

## 2014-01-23 NOTE — Telephone Encounter (Signed)
Pt returning call.Latasha Parker ° °

## 2014-01-23 NOTE — Telephone Encounter (Signed)
Spoke with the pt via Relay service and notified of recs per VS  Pt verbalized understanding  Nothing further needed

## 2014-01-24 ENCOUNTER — Telehealth: Payer: Self-pay | Admitting: Pulmonary Disease

## 2014-01-24 NOTE — Telephone Encounter (Signed)
Spoke with the pharmacist  They received the rx for the alpraz 0.25 mg # 60 Due to state regulatory restrictions, she can not get this filled until 02/06/14  Pharmacist advised that it may go through if we change rx to the 0.5 mg # 30 1/2 to 1 qhs  Please advise if okay thanks1

## 2014-01-28 NOTE — Telephone Encounter (Signed)
Spoke with Vicente Males -pharmacist States that she was able to get the Xanax .25mg  tabs approved and re-ordered, this is being filled.  Alprazolam 0.25 mg # 60 1-2 tabs prn bedtime approved. Change in dose Rx not needed.  Nothing further needed.

## 2014-01-28 NOTE — Telephone Encounter (Signed)
Okay to send script for alprazolam 0.5 mg, 1/2 to 1 pill qhs prn, dispense 30 with no refills.

## 2014-02-17 ENCOUNTER — Telehealth: Payer: Self-pay | Admitting: Pulmonary Disease

## 2014-02-17 NOTE — Telephone Encounter (Signed)
As detailed in phone note from 01/23/14 the pt was informed that future refills/adjustments for xanax should be done through her PCP or behavioral health.

## 2014-02-17 NOTE — Telephone Encounter (Signed)
Attempted to call the pt and had to leave a message through the relay messaging center.   Will wait for pt to call back.

## 2014-02-17 NOTE — Telephone Encounter (Signed)
VS please advise if ok to refill the xanax rx.  Pt stated that she will not need it refilled until 10-8 and this was last called in on 9-3.  Please advise. Thanks  Allergies  Allergen Reactions  . Tramadol Nausea And Vomiting   Current Outpatient Prescriptions on File Prior to Visit  Medication Sig Dispense Refill  . albuterol (PROAIR HFA) 108 (90 BASE) MCG/ACT inhaler Inhale 2 puffs into the lungs every 6 (six) hours as needed for wheezing or shortness of breath.  1 Inhaler  1  . ALPRAZolam (XANAX) 0.25 MG tablet Take 1-2 tablets (0.25-0.5 mg total) by mouth at bedtime as needed for anxiety.  60 tablet  0  . beclomethasone (QVAR) 80 MCG/ACT inhaler Inhale 2 puffs into the lungs 2 (two) times daily.  1 Inhaler  6  . buPROPion (ZYBAN) 150 MG 12 hr tablet One pill daily for 3 days, then one pill twice per day  60 tablet  3   No current facility-administered medications on file prior to visit.

## 2014-02-18 MED ORDER — ALPRAZOLAM 0.25 MG PO TABS: 0.2500 mg | ORAL_TABLET | Freq: Every evening | ORAL | Status: DC | PRN

## 2014-02-18 NOTE — Telephone Encounter (Signed)
Spoke with patient via Relay Service-- Pt states she is out of her medication and would like another refill. States that she has not received any information regarding a referral to [facility] and is not established with them currently. Pt aware that per VS rec's, she needs to seek further refills with her PCP. Pt states that she is very frustrated and this process is taking too long. Pt is requesting that Dr Halford Chessman refill one last time until she can get established with [facility]. Pt kept saying that she was told last visit/telephone call with VS that she would have 1-2 refills on the Rx being called in; there is nothing documented saying such.  Please advise Dr Halford Chessman on refill request and referral or recommendations for someone she can call. Thanks.

## 2014-02-18 NOTE — Telephone Encounter (Signed)
Pt returning call.Latasha Parker ° °

## 2014-02-18 NOTE — Telephone Encounter (Signed)
Okay to send in refill for one month supply of xanax 0.25 mg, 1 to 2 tablets qhs prn, dispense 60 tablets with no refills.   Order was placed to arrange for evaluation with behavioral health in Oct 09, 2013 >> please check whether this was arranged, and if not then ask pt if she would like to schedule evaluation with behavioral health.

## 2014-02-18 NOTE — Telephone Encounter (Signed)
Called and spoke with pt and she is aware of refill called to her pharmacy.  She was given the number to behavioral health to call and set up an appt for further refills of her medications.

## 2014-03-20 ENCOUNTER — Ambulatory Visit (INDEPENDENT_AMBULATORY_CARE_PROVIDER_SITE_OTHER): Payer: Medicaid Other | Admitting: Pulmonary Disease

## 2014-03-20 ENCOUNTER — Encounter: Payer: Self-pay | Admitting: Pulmonary Disease

## 2014-03-20 VITALS — BP 108/72 | HR 75 | Temp 97.8°F | Ht 68.0 in | Wt 169.0 lb

## 2014-03-20 DIAGNOSIS — F4323 Adjustment disorder with mixed anxiety and depressed mood: Secondary | ICD-10-CM

## 2014-03-20 DIAGNOSIS — G4700 Insomnia, unspecified: Secondary | ICD-10-CM

## 2014-03-20 DIAGNOSIS — Z720 Tobacco use: Secondary | ICD-10-CM

## 2014-03-20 DIAGNOSIS — J449 Chronic obstructive pulmonary disease, unspecified: Secondary | ICD-10-CM

## 2014-03-20 DIAGNOSIS — Z23 Encounter for immunization: Secondary | ICD-10-CM

## 2014-03-20 MED ORDER — ALPRAZOLAM 0.5 MG PO TABS: 0.5000 mg | ORAL_TABLET | Freq: Three times a day (TID) | ORAL | Status: DC | PRN

## 2014-03-20 NOTE — Assessment & Plan Note (Signed)
Making progress with bupropion.  Encouraged her to keep up with smoking cessation efforts.

## 2014-03-20 NOTE — Progress Notes (Signed)
Chief Complaint  Patient presents with  . Follow-up    Pt c/o increased chest tightness when smoking. Pt states that she has been having a lot of SOB and has been told she is wheezing. Pt still using QVAR inhaler- works well. Pt feels Xanax needs to be increased d/t having breakthru symptoms and unable to sleep d/t anxiety.     History of Present Illness: Latasha Parker is a 38 y.o. female smoker with insomnia and wheezing.  Interview done with sign language interpreter.  Her breathing is better since adding Qvar.  She uses albuterol once per day.  She is down to 1/2 pack cig per day >> bupropion helping.  She uses xanax 2 to 3 times per day >> this helps, but she still wakes up during th night.  She tried calling to schedule appt with behavioral health, but never heard back from them.  TESTS: CXR 09/05/13 >> no acute disease PFT 01/21/14 >> borderline obstruction, normal lung volumes/diffusion, no BD response  PMHx, PSHx, Medications, Allergies, Fhx, Shx reviewed.  Physical Exam:  General - No distress ENT - No sinus tenderness, no oral exudate, no LAN Cardiac - s1s2 regular, no murmur Chest - No wheeze/rales/dullness Back - No focal tenderness Abd - Soft, non-tender Ext - No edema Neuro - Normal strength Skin - No rashes Psych - normal mood, and behavior   Assessment/Plan:  Latasha Mires, MD [facility] Pager:  [phone]

## 2014-03-20 NOTE — Patient Instructions (Signed)
Flu shot today Will arrange for referral back to Behavioral health Follow up in 4 months

## 2014-03-20 NOTE — Assessment & Plan Note (Signed)
She has borderline obstruction on recent PFT.  Improved with use of Qvar and prn albuterol.

## 2014-03-20 NOTE — Assessment & Plan Note (Signed)
Improved with addition of xanax >> will change to 0.5 mg tid prn.  She needs to f/u with behavioral health >> will referral back to behavioral health to further assess for depression/anxiety.

## 2014-03-24 ENCOUNTER — Encounter: Payer: Self-pay | Admitting: Pulmonary Disease

## 2014-04-03 ENCOUNTER — Encounter (HOSPITAL_COMMUNITY): Payer: Self-pay | Admitting: Emergency Medicine

## 2014-04-03 ENCOUNTER — Emergency Department (HOSPITAL_COMMUNITY)
Admission: EM | Admit: 2014-04-03 | Discharge: 2014-04-03 | Disposition: A | Discharge status: Home / Self Care | Payer: Medicaid Other | Attending: Emergency Medicine | Admitting: Emergency Medicine

## 2014-04-03 ENCOUNTER — Emergency Department (HOSPITAL_COMMUNITY): Payer: Medicaid Other

## 2014-04-03 DIAGNOSIS — Z7951 Long term (current) use of inhaled steroids: Secondary | ICD-10-CM | POA: Diagnosis not present

## 2014-04-03 DIAGNOSIS — Z3202 Encounter for pregnancy test, result negative: Secondary | ICD-10-CM | POA: Insufficient documentation

## 2014-04-03 DIAGNOSIS — H9190 Unspecified hearing loss, unspecified ear: Secondary | ICD-10-CM | POA: Diagnosis not present

## 2014-04-03 DIAGNOSIS — Z79899 Other long term (current) drug therapy: Secondary | ICD-10-CM | POA: Insufficient documentation

## 2014-04-03 DIAGNOSIS — M5432 Sciatica, left side: Secondary | ICD-10-CM | POA: Diagnosis not present

## 2014-04-03 DIAGNOSIS — R102 Pelvic and perineal pain: Secondary | ICD-10-CM

## 2014-04-03 DIAGNOSIS — R109 Unspecified abdominal pain: Secondary | ICD-10-CM | POA: Diagnosis present

## 2014-04-03 DIAGNOSIS — Z9889 Other specified postprocedural states: Secondary | ICD-10-CM | POA: Insufficient documentation

## 2014-04-03 DIAGNOSIS — Z720 Tobacco use: Secondary | ICD-10-CM | POA: Diagnosis not present

## 2014-04-03 DIAGNOSIS — G8929 Other chronic pain: Secondary | ICD-10-CM | POA: Diagnosis not present

## 2014-04-03 DIAGNOSIS — N858 Other specified noninflammatory disorders of uterus: Secondary | ICD-10-CM | POA: Insufficient documentation

## 2014-04-03 DIAGNOSIS — Z8639 Personal history of other endocrine, nutritional and metabolic disease: Secondary | ICD-10-CM | POA: Diagnosis not present

## 2014-04-03 LAB — URINALYSIS, ROUTINE W REFLEX MICROSCOPIC
BILIRUBIN URINE: NEGATIVE
Glucose, UA: NEGATIVE mg/dL
Ketones, ur: NEGATIVE mg/dL
Leukocytes, UA: NEGATIVE
Nitrite: NEGATIVE
PROTEIN: NEGATIVE mg/dL
Specific Gravity, Urine: <1.005 — ABNORMAL LOW (ref 1.005–1.030)
Urobilinogen, UA: 0.2 mg/dL (ref 0.0–1.0)
pH: 6 (ref 5.0–8.0)

## 2014-04-03 LAB — WET PREP, GENITAL
Clue Cells Wet Prep HPF POC: NONE SEEN
TRICH WET PREP: NONE SEEN
WBC WET PREP: NONE SEEN
Yeast Wet Prep HPF POC: NONE SEEN

## 2014-04-03 LAB — URINE MICROSCOPIC-ADD ON

## 2014-04-03 LAB — POC URINE PREG, ED: Preg Test, Ur: NEGATIVE

## 2014-04-03 MED ORDER — PREDNISONE 20 MG PO TABS: 40.0000 mg | ORAL_TABLET | Freq: Every day | ORAL | Status: DC

## 2014-04-03 MED ORDER — OXYCODONE-ACETAMINOPHEN 5-325 MG PO TABS
2.0000 | ORAL_TABLET | Freq: Once | ORAL | Status: AC
  Administered 2014-04-03: 2 via ORAL
  Filled 2014-04-03: qty 2

## 2014-04-03 MED ORDER — OXYCODONE-ACETAMINOPHEN 5-325 MG PO TABS: 2.0000 | ORAL_TABLET | Freq: Four times a day (QID) | ORAL | Status: DC | PRN

## 2014-04-03 NOTE — ED Notes (Signed)
Interpreter will be here around 0900.

## 2014-04-03 NOTE — Discharge Planning (Signed)
Pt PCP per Medicaid card: [facility]  Address: [address] [place], [place] [address] Contact: [place] AS Telephone: [phone]

## 2014-04-03 NOTE — ED Notes (Signed)
PA at bedside.

## 2014-04-03 NOTE — Discharge Instructions (Signed)
Abdominal Pain Many things can cause abdominal pain. Usually, abdominal pain is not caused by a disease and will improve without treatment. It can often be observed and treated at home. Your health care provider will do a physical exam and possibly order blood tests and X-rays to help determine the seriousness of your pain. However, in many cases, more time must pass before a clear cause of the pain can be found. Before that point, your health care provider may not know if you need more testing or further treatment. HOME CARE INSTRUCTIONS  Monitor your abdominal pain for any changes. The following actions may help to alleviate any discomfort you are experiencing:  Only take over-the-counter or prescription medicines as directed by your health care provider.  Do not take laxatives unless directed to do so by your health care provider.  Try a clear liquid diet (broth, tea, or water) as directed by your health care provider. Slowly move to a bland diet as tolerated. SEEK MEDICAL CARE IF:  You have unexplained abdominal pain.  You have abdominal pain associated with nausea or diarrhea.  You have pain when you urinate or have a bowel movement.  You experience abdominal pain that wakes you in the night.  You have abdominal pain that is worsened or improved by eating food.  You have abdominal pain that is worsened with eating fatty foods.  You have a fever. SEEK IMMEDIATE MEDICAL CARE IF:   Your pain does not go away within 2 hours.  You keep throwing up (vomiting).  Your pain is felt only in portions of the abdomen, such as the right side or the left lower portion of the abdomen.  You pass bloody or black tarry stools. MAKE SURE YOU:  Understand these instructions.   Will watch your condition.   Will get help right away if you are not doing well or get worse.  Document Released: 02/16/2005 Document Revised: 05/14/2013 Document Reviewed: 01/16/2013 [facility] Patient  Information 2015 [place], [place]. This information is not intended to replace advice given to you by your health care provider. Make sure you discuss any questions you have with your health care provider. Radicular Pain Radicular pain in either the arm or leg is usually from a bulging or herniated disk in the spine. A piece of the herniated disk may press against the nerves as the nerves exit the spine. This causes pain which is felt at the tips of the nerves down the arm or leg. Other causes of radicular pain may include:  Fractures.  Heart disease.  Cancer.  An abnormal and usually degenerative state of the nervous system or nerves (neuropathy). Diagnosis may require CT or MRI scanning to determine the primary cause.  Nerves that start at the neck (nerve roots) may cause radicular pain in the outer shoulder and arm. It can spread down to the thumb and fingers. The symptoms vary depending on which nerve root has been affected. In most cases radicular pain improves with conservative treatment. Neck problems may require physical therapy, a neck collar, or cervical traction. Treatment may take many weeks, and surgery may be considered if the symptoms do not improve.  Conservative treatment is also recommended for sciatica. Sciatica causes pain to radiate from the lower back or buttock area down the leg into the foot. Often there is a history of back problems. Most patients with sciatica are better after 2 to 4 weeks of rest and other supportive care. Short term bed rest can reduce the disk  pressure considerably. Sitting, however, is not a good position since this increases the pressure on the disk. You should avoid bending, lifting, and all other activities which make the problem worse. Traction can be used in severe cases. Surgery is usually reserved for patients who do not improve within the first months of treatment. Only take over-the-counter or prescription medicines for pain, discomfort, or fever as  directed by your caregiver. Narcotics and muscle relaxants may help by relieving more severe pain and spasm and by providing mild sedation. Cold or massage can give significant relief. Spinal manipulation is not recommended. It can increase the degree of disc protrusion. Epidural steroid injections are often effective treatment for radicular pain. These injections deliver medicine to the spinal nerve in the space between the protective covering of the spinal cord and back bones (vertebrae). Your caregiver can give you more information about steroid injections. These injections are most effective when given within two weeks of the onset of pain.  You should see your caregiver for follow up care as recommended. A program for neck and back injury rehabilitation with stretching and strengthening exercises is an important part of management.  SEEK IMMEDIATE MEDICAL CARE IF:  You develop increased pain, weakness, or numbness in your arm or leg.  You develop difficulty with bladder or bowel control.  You develop abdominal pain. Document Released: 06/16/2004 Document Revised: 08/01/2011 Document Reviewed: 09/01/2008 [facility] Patient Information 2015 [place], [place]. This information is not intended to replace advice given to you by your health care provider. Make sure you discuss any questions you have with your health care provider.

## 2014-04-03 NOTE — ED Notes (Signed)
Sign language interrupter at bedside

## 2014-04-03 NOTE — ED Provider Notes (Signed)
CSN: [id number]     Arrival date & time 04/03/14  0805 History   First MD Initiated Contact with Patient 04/03/14 0818     Chief Complaint  Patient presents with  . Abdominal Pain     (Consider location/radiation/quality/duration/timing/severity/associated sxs/prior Treatment) HPI Comments: Patient presents to the emergency department with chief complaint of pelvic pain. She states that she has chronic pelvic pain. Reports having a left oophorectomy 2 years ago. She states that she has had intermittent pain since then.  She states that the pain waxes and wanes. She states that the pain is worsened after her periods. She has not tried taking anything to alleviate her symptoms.   She denies any fevers, chills, nausea, vomiting, diarrhea, dysuria.  She states that she has been seen several times and [facility].  She has been advised to get an IUD or start birth control, but the patient refuses this.    Additionally, she complains of low back pain that radiates to the left leg. She thinks that this pain is associated with her ovary. She denies any back injury. Denies any bowel or bladder incontinence.  The history is provided by the patient. The history is limited by a language barrier. A language interpreter was used (ASL).    Past Medical History  Diagnosis Date  . Deaf   . SVD (spontaneous vaginal delivery)   . Hyperthyroidism   . Menorrhagia   . Pelvic pain    Past Surgical History  Procedure Laterality Date  . Ovary tumor      cyst  . Foot surgery    . Diagnostic laparoscopy  2003    ovarian cyst removed  . Wisdom tooth extraction    . Laparoscopy N/A 07/17/2012    Procedure: LAPAROSCOPY OPERATIVE;  Surgeon: Mora Bellman, MD;  Location: [facility];  Service: Gynecology;  Laterality: N/A;  left oopherectomy and salpingectomy   Family History  Problem Relation Age of Onset  . Cancer Maternal Grandmother   . Cancer Mother   . Cancer Father    History  Substance Use Topics   . Smoking status: Current Every Day Smoker -- 0.50 packs/day for 15 years    Types: Cigarettes  . Smokeless tobacco: Never Used  . Alcohol Use: No   OB History    Gravida Para Term Preterm AB TAB SAB Ectopic Multiple Living   2 2 2       2      Review of Systems  Constitutional: Negative for fever and chills.  Respiratory: Negative for shortness of breath.   Cardiovascular: Negative for chest pain.  Gastrointestinal: Negative for nausea, vomiting, diarrhea and constipation.  Genitourinary: Positive for pelvic pain. Negative for dysuria.  All other systems reviewed and are negative.     Allergies  Tramadol  Home Medications   Prior to Admission medications   Medication Sig Start Date End Date Taking? Authorizing Provider  albuterol (PROAIR HFA) 108 (90 BASE) MCG/ACT inhaler Inhale 2 puffs into the lungs every 6 (six) hours as needed for wheezing or shortness of breath. 09/05/13   Chesley Mires, MD  ALPRAZolam Duanne Moron) 0.5 MG tablet Take 1 tablet (0.5 mg total) by mouth 3 (three) times daily as needed for anxiety. 03/20/14   Chesley Mires, MD  beclomethasone (QVAR) 80 MCG/ACT inhaler Inhale 2 puffs into the lungs 2 (two) times daily. 01/21/14   Tammy S Parrett, NP  buPROPion (ZYBAN) 150 MG 12 hr tablet One pill daily for 3 days, then one pill twice  per day 09/05/13   Chesley Mires, MD   BP 94/64 mmHg  Pulse 73  Temp(Src) 97.6 F (36.4 C) (Oral)  Resp 16  SpO2 97% Physical Exam  Constitutional: She is oriented to person, place, and time. She appears well-developed and well-nourished.  HENT:  Head: Normocephalic and atraumatic.  Eyes: Conjunctivae and EOM are normal. Pupils are equal, round, and reactive to light.  Neck: Normal range of motion. Neck supple.  Cardiovascular: Normal rate and regular rhythm.  Exam reveals no gallop and no friction rub.   No murmur heard. Pulmonary/Chest: Effort normal and breath sounds normal. No respiratory distress. She has no wheezes. She has no  rales. She exhibits no tenderness.  Abdominal: Soft. Bowel sounds are normal. She exhibits no distension and no mass. There is no tenderness. There is no rebound and no guarding.  Genitourinary:  Pelvic exam chaperoned by female ER tech, no right adnexal tenderness, moderate left adnexal tenderness, no uterine tenderness, no vaginal discharge, no bleeding, no CMT or friability, no foreign body, no injury to the external genitalia, no other significant findings   Musculoskeletal: Normal range of motion. She exhibits no edema or tenderness.  Neurological: She is alert and oriented to person, place, and time.  Skin: Skin is warm and dry.  Psychiatric: She has a normal mood and affect. Her behavior is normal. Judgment and thought content normal.  Nursing note and vitals reviewed.   ED Course  Procedures (including critical care time) Results for orders placed or performed during the hospital encounter of 04/03/14  Wet prep, genital  Result Value Ref Range   Yeast Wet Prep HPF POC NONE SEEN NONE SEEN   Trich, Wet Prep NONE SEEN NONE SEEN   Clue Cells Wet Prep HPF POC NONE SEEN NONE SEEN   WBC, Wet Prep HPF POC NONE SEEN NONE SEEN  Urinalysis, Routine w reflex microscopic  Result Value Ref Range   Color, Urine YELLOW YELLOW   APPearance CLEAR CLEAR   Specific Gravity, Urine <1.005 (L) 1.005 - 1.030   pH 6.0 5.0 - 8.0   Glucose, UA NEGATIVE NEGATIVE mg/dL   Hgb urine dipstick SMALL (A) NEGATIVE   Bilirubin Urine NEGATIVE NEGATIVE   Ketones, ur NEGATIVE NEGATIVE mg/dL   Protein, ur NEGATIVE NEGATIVE mg/dL   Urobilinogen, UA 0.2 0.0 - 1.0 mg/dL   Nitrite NEGATIVE NEGATIVE   Leukocytes, UA NEGATIVE NEGATIVE  Urine microscopic-add on  Result Value Ref Range   Squamous Epithelial / LPF FEW (A) RARE   WBC, UA 0-2 <3 WBC/hpf   RBC / HPF 3-6 <3 RBC/hpf   Bacteria, UA FEW (A) RARE   Urine-Other MUCOUS PRESENT   POC urine preg, ED (not at [facility])  Result Value Ref Range   Preg Test, Ur  NEGATIVE NEGATIVE   US Transvaginal Non-ob  04/03/2014   CLINICAL DATA:  Left adnexal tenderness.  EXAM: TRANSABDOMINAL AND TRANSVAGINAL ULTRASOUND OF PELVIS  DOPPLER ULTRASOUND OF OVARIES  TECHNIQUE: Both transabdominal and transvaginal ultrasound examinations of the pelvis were performed. Transabdominal technique was performed for global imaging of the pelvis including uterus, ovaries, adnexal regions, and pelvic cul-de-sac.  It was necessary to proceed with endovaginal exam following the transabdominal exam to visualize the ovaries. Color and duplex Doppler ultrasound was utilized to evaluate blood flow to the ovaries.  COMPARISON:  04/11/2013  FINDINGS: Uterus  Measurements: 10 x 5 x 6 cm. No fibroids or other mass visualized.  Endometrium  Thickness: 8 mm.  No focal abnormality  visualized.  Right ovary  Measurements: 5 x 2.3 x 4.2 cm. Moderate enlargement is secondary to a 2.7 cm intra-ovarian echogenic mass with dirty posterior acoustic shadowing. This has the appearnce of a dermoid, although it is unexpected to have appeared over a 1 year interval.  Left ovary  Surgically absent  Pulsed Doppler evaluation of remaining right ovary demonstrates normal low-resistance arterial and venous waveforms.  Other findings  No free fluid.  IMPRESSION: 1. Status post left oophorectomy. No findings to explain left pelvic pain. 2. New 3 cm echogenic and shadowing mass in the right ovary, likely a dermoid. 3. Normal blood flow to the right ovary.   Electronically Signed   By: Jorje Guild M.D.   On: 04/03/2014 11:40   US Pelvis Complete  04/03/2014   CLINICAL DATA:  Left adnexal tenderness.  EXAM: TRANSABDOMINAL AND TRANSVAGINAL ULTRASOUND OF PELVIS  DOPPLER ULTRASOUND OF OVARIES  TECHNIQUE: Both transabdominal and transvaginal ultrasound examinations of the pelvis were performed. Transabdominal technique was performed for global imaging of the pelvis including uterus, ovaries, adnexal regions, and pelvic  cul-de-sac.  It was necessary to proceed with endovaginal exam following the transabdominal exam to visualize the ovaries. Color and duplex Doppler ultrasound was utilized to evaluate blood flow to the ovaries.  COMPARISON:  04/11/2013  FINDINGS: Uterus  Measurements: 10 x 5 x 6 cm. No fibroids or other mass visualized.  Endometrium  Thickness: 8 mm.  No focal abnormality visualized.  Right ovary  Measurements: 5 x 2.3 x 4.2 cm. Moderate enlargement is secondary to a 2.7 cm intra-ovarian echogenic mass with dirty posterior acoustic shadowing. This has the appearnce of a dermoid, although it is unexpected to have appeared over a 1 year interval.  Left ovary  Surgically absent  Pulsed Doppler evaluation of remaining right ovary demonstrates normal low-resistance arterial and venous waveforms.  Other findings  No free fluid.  IMPRESSION: 1. Status post left oophorectomy. No findings to explain left pelvic pain. 2. New 3 cm echogenic and shadowing mass in the right ovary, likely a dermoid. 3. Normal blood flow to the right ovary.   Electronically Signed   By: Jorje Guild M.D.   On: 04/03/2014 11:40   [place] Art/ven Flow Abd Pelv Doppler  04/03/2014   CLINICAL DATA:  Left adnexal tenderness.  EXAM: TRANSABDOMINAL AND TRANSVAGINAL ULTRASOUND OF PELVIS  DOPPLER ULTRASOUND OF OVARIES  TECHNIQUE: Both transabdominal and transvaginal ultrasound examinations of the pelvis were performed. Transabdominal technique was performed for global imaging of the pelvis including uterus, ovaries, adnexal regions, and pelvic cul-de-sac.  It was necessary to proceed with endovaginal exam following the transabdominal exam to visualize the ovaries. Color and duplex Doppler ultrasound was utilized to evaluate blood flow to the ovaries.  COMPARISON:  04/11/2013  FINDINGS: Uterus  Measurements: 10 x 5 x 6 cm. No fibroids or other mass visualized.  Endometrium  Thickness: 8 mm.  No focal abnormality visualized.  Right ovary  Measurements: 5 x  2.3 x 4.2 cm. Moderate enlargement is secondary to a 2.7 cm intra-ovarian echogenic mass with dirty posterior acoustic shadowing. This has the appearnce of a dermoid, although it is unexpected to have appeared over a 1 year interval.  Left ovary  Surgically absent  Pulsed Doppler evaluation of remaining right ovary demonstrates normal low-resistance arterial and venous waveforms.  Other findings  No free fluid.  IMPRESSION: 1. Status post left oophorectomy. No findings to explain left pelvic pain. 2. New 3 cm echogenic and shadowing mass  in the right ovary, likely a dermoid. 3. Normal blood flow to the right ovary.   Electronically Signed   By: Jorje Guild M.D.   On: 04/03/2014 11:40     Imaging Review No results found.   EKG Interpretation None      MDM   Final diagnoses:  Left adnexal tenderness  Sciatica, left    Patient with chronic pelvic pain.  Will check UA and urine preg.  No abdominal tenderness.  Will perform pelvic exam and reassess.  Patient did have some tenderness over the left adnexa on pelvic exam. I will proceed with pelvic ultrasound. Regarding the patient's active pain and left leg pain. I think this is probably sciatica, and not related to her pelvic pain. Will discuss treatment options with patient.  [place] is negative for acute process on the left.  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  Patient has had chronic left adnexal tenderness for 2 years.  She is afebrile.  Abdomen is soft and non-tender.  No n/v/d.  Will treat with pain medicine and given OBGYN follow-up.  Patient wants to see a new OBGYN.  Will treat with percocet and prednisone for sciatica.  Patient is stable and ready for discharge. She is well-appearing and not in any apparent distress.    Montine Circle, PA-C 04/03/14 [place], PA-C 04/03/14 [place], MD 04/05/14 [phone]

## 2014-04-03 NOTE — ED Notes (Signed)
Pt is deaf, waiting for interpretor, has written note indicating bad pain and ovarian cyst complications, pt is A/O, ambulatory and in NAD

## 2014-04-04 LAB — GC/CHLAMYDIA PROBE AMP
CT PROBE, AMP APTIMA: NEGATIVE
GC Probe RNA: NEGATIVE

## 2014-05-07 ENCOUNTER — Telehealth: Payer: Self-pay

## 2014-05-07 NOTE — Telephone Encounter (Signed)
Patient called front office staff, frustrated and unhappy because she has been unable to find another provider who accepts medicaid. Requested to speak with RN. Discussed patient's pelvic pain-- patient reports being offered IUD or OCPs for pain and states she does not want any of these things. She wishes to have a hysterectomy and wants to see a doctor who will do that. Explained to patient that our providers like to begin treatment with the least invasive options. Patient verbalizes understanding, continues to express frustration and states "I'm done, I just want surgery." patient states she wants a second opinion. Informed patient she can certainly call around and make an appointment with another GYN, however, explained I am not sure who is accepting medicaid at this time. Patient demands to see another doctor to discuss options. Informed patient she can make another appointment to re-discuss her options but explained that it is unlikely they will change the already offered treatment options at this time. Patient verbalized understanding and stated she wanted to do so. Call transferred to front office and appointment made.

## 2014-05-08 ENCOUNTER — Telehealth: Payer: Self-pay

## 2014-05-08 DIAGNOSIS — N946 Dysmenorrhea, unspecified: Secondary | ICD-10-CM

## 2014-05-08 DIAGNOSIS — R102 Pelvic and perineal pain: Secondary | ICD-10-CM

## 2014-05-08 NOTE — Telephone Encounter (Signed)
U/S scheduled for 06/09/14 at 0915. Patient should drink water 1 hour to appointment and hold bladder. Patient speaks sign language-- interpreter left message informing patient of scheduled U/S appointment date, time and location and to call clinic with any questions. Called mobile number. Boyfriend answered and stated he was at work but took message to inform patient of U/S appointment date, time and location and simply explained it will help provider determine plan of care. Advised to have patient call clinic with any questions or concerns.

## 2014-05-08 NOTE — Telephone Encounter (Signed)
-----   Message from Mora Bellman, MD sent at 05/08/2014  9:08 AM EST ----- Yes. Please schedule repeat ultrasound  Peggy  ----- Message -----    From: Geanie Logan, RN    Sent: 05/07/2014  11:35 AM      To: Mora Bellman, MD  Hi Dr. Elly Modena,   This patient was seen by Dr. Ihor Dow in 10/2013 for heavy bleeding/pelvic pain. Patient offered IUD or OCPs and declined. Called today very frustrated with pelvic pain and requesting hysterectomy. She asked to see another provider. She has been scheduled with you for January to discuss pelvic pain--- had recent U/S in 03/2014 that showed dermoid cyst. Will you want another U/S prior to her scheduled appointment?   Please advise.   Thank you,  Lovena Le, RN

## 2014-06-05 MED ORDER — CEFAZOLIN SODIUM-DEXTROSE 2-3 GM-% IV SOLR
2.0000 g | INTRAVENOUS | Status: AC
  Administered 2014-06-06: 2 g via INTRAVENOUS

## 2014-06-05 NOTE — Anesthesia Preprocedure Evaluation (Addendum)
Anesthesia Evaluation  Patient identified by MRN, date of birth, ID band Patient awake    Reviewed: Allergy & Precautions, NPO status , Patient's Chart, lab work & pertinent test results, reviewed documented beta blocker date and time   Airway Mallampati: II  TM Distance: >3 FB Neck ROM: Full    Dental  (+) Poor Dentition, Dental Advisory Given   Pulmonary asthma , COPD COPD inhaler, Current Smoker,          Cardiovascular negative cardio ROS      Neuro/Psych    GI/Hepatic negative GI ROS, Neg liver ROS,   Endo/Other  Hyperthyroidism   Renal/GU negative Renal ROS     Musculoskeletal  (+) Arthritis -,   Abdominal   Peds  Hematology   Anesthesia Other Findings   Reproductive/Obstetrics                            Anesthesia Physical Anesthesia Plan  ASA: II  Anesthesia Plan: General   Post-op Pain Management:    Induction: Intravenous  Airway Management Planned: Nasal ETT  Additional Equipment:   Intra-op Plan:   Post-operative Plan: Extubation in OR  Informed Consent: I have reviewed the patients History and Physical, chart, labs and discussed the procedure including the risks, benefits and alternatives for the proposed anesthesia with the patient or authorized representative who has indicated his/her understanding and acceptance.     Plan Discussed with:   Anesthesia Plan Comments: (Need am pregnancy test)        Anesthesia Quick Evaluation

## 2014-06-05 NOTE — Progress Notes (Signed)
Several unsuccessful attempts were made to contact pt; lvm using interpreter # [phone] with pre-op instructions only. Please complete pt assessment on DOS.

## 2014-06-05 NOTE — H&P (Signed)
HISTORY AND PHYSICAL  Latasha Parker is a 39 y.o. female patient with CC: painful teeth  No diagnosis found.  Past Medical History  Diagnosis Date  . Deaf   . SVD (spontaneous vaginal delivery)   . Hyperthyroidism   . Menorrhagia   . Pelvic pain   . Asthma     No current facility-administered medications for this encounter.   Current Outpatient Prescriptions  Medication Sig Dispense Refill  . albuterol (PROAIR HFA) 108 (90 BASE) MCG/ACT inhaler Inhale 2 puffs into the lungs every 6 (six) hours as needed for wheezing or shortness of breath. 1 Inhaler 1  . ALPRAZolam (XANAX) 0.5 MG tablet Take 1 tablet (0.5 mg total) by mouth 3 (three) times daily as needed for anxiety. 90 tablet 3  . beclomethasone (QVAR) 80 MCG/ACT inhaler Inhale 2 puffs into the lungs 2 (two) times daily. 1 Inhaler 6  . buPROPion (ZYBAN) 150 MG 12 hr tablet One pill daily for 3 days, then one pill twice per day 60 tablet 3  . oxyCODONE-acetaminophen (PERCOCET/ROXICET) 5-325 MG per tablet Take 2 tablets by mouth every 6 (six) hours as needed for severe pain. 15 tablet 0  . predniSONE (DELTASONE) 20 MG tablet Take 2 tablets (40 mg total) by mouth daily. 10 tablet 0   Allergies  Allergen Reactions  . Tramadol Nausea And Vomiting   Active Problems:   * No active hospital problems. *  Vitals: There were no vitals taken for this visit. Lab results:No results found for this or any previous visit (from the past 45 hour(s)). Radiology Results: No results found. General appearance: alert, cooperative and Patient is hearing impaired. Head: Normocephalic, without obvious abnormality, atraumatic Eyes: negative Nose: Nares normal. Septum midline. Mucosa normal. No drainage or sinus tenderness. Throat: Severe dental caries all remaining teeth #3-14, 20-30, bilateral mandibular lingual tori. Neck: no adenopathy, no carotid bruit, supple, symmetrical, trachea midline and thyroid not enlarged, symmetric, no  tenderness/mass/nodules Resp: clear to auscultation bilaterally Cardio: regular rate and rhythm, S1, S2 normal, no murmur, click, rub or gallop  Assessment: All teeth non-restorable. Bilateral mandibular tori.  Plan: Full mouth extractions with alveoloplasty. Removal mandibular lingual tori. General anesthesia. Day surgery.   Gae Bon 06/05/2014

## 2014-06-06 ENCOUNTER — Ambulatory Visit (HOSPITAL_COMMUNITY): Payer: Medicaid Other | Admitting: Anesthesiology

## 2014-06-06 ENCOUNTER — Ambulatory Visit (HOSPITAL_BASED_OUTPATIENT_CLINIC_OR_DEPARTMENT_OTHER)
Admission: RE | Admit: 2014-06-06 | Discharge: 2014-06-06 | Disposition: A | Discharge status: Home / Self Care | Payer: Medicaid Other | Source: Ambulatory Visit | Attending: Oral Surgery | Admitting: Oral Surgery

## 2014-06-06 ENCOUNTER — Encounter (HOSPITAL_COMMUNITY)
Admission: RE | Disposition: A | Discharge status: Home / Self Care | Payer: Self-pay | Source: Ambulatory Visit | Attending: Oral Surgery

## 2014-06-06 ENCOUNTER — Encounter (HOSPITAL_COMMUNITY): Payer: Self-pay | Admitting: Certified Registered Nurse Anesthetist

## 2014-06-06 DIAGNOSIS — E0590 Thyrotoxicosis, unspecified without thyrotoxic crisis or storm: Secondary | ICD-10-CM | POA: Insufficient documentation

## 2014-06-06 DIAGNOSIS — Z79899 Other long term (current) drug therapy: Secondary | ICD-10-CM | POA: Insufficient documentation

## 2014-06-06 DIAGNOSIS — M1990 Unspecified osteoarthritis, unspecified site: Secondary | ICD-10-CM | POA: Diagnosis not present

## 2014-06-06 DIAGNOSIS — J449 Chronic obstructive pulmonary disease, unspecified: Secondary | ICD-10-CM | POA: Insufficient documentation

## 2014-06-06 DIAGNOSIS — K088 Other specified disorders of teeth and supporting structures: Secondary | ICD-10-CM | POA: Diagnosis present

## 2014-06-06 DIAGNOSIS — J45909 Unspecified asthma, uncomplicated: Secondary | ICD-10-CM | POA: Diagnosis not present

## 2014-06-06 DIAGNOSIS — M270 Developmental disorders of jaws: Secondary | ICD-10-CM | POA: Diagnosis not present

## 2014-06-06 DIAGNOSIS — F17210 Nicotine dependence, cigarettes, uncomplicated: Secondary | ICD-10-CM | POA: Insufficient documentation

## 2014-06-06 DIAGNOSIS — H9190 Unspecified hearing loss, unspecified ear: Secondary | ICD-10-CM | POA: Insufficient documentation

## 2014-06-06 DIAGNOSIS — K029 Dental caries, unspecified: Secondary | ICD-10-CM | POA: Diagnosis not present

## 2014-06-06 HISTORY — DX: Unspecified ovarian cyst, unspecified side: N83.209

## 2014-06-06 HISTORY — PX: MULTIPLE EXTRACTIONS WITH ALVEOLOPLASTY: SHX5342

## 2014-06-06 HISTORY — DX: Unspecified osteoarthritis, unspecified site: M19.90

## 2014-06-06 LAB — CBC
HEMATOCRIT: 41.6 % (ref 36.0–46.0)
Hemoglobin: 14.4 g/dL (ref 12.0–15.0)
MCH: 32 pg (ref 26.0–34.0)
MCHC: 34.6 g/dL (ref 30.0–36.0)
MCV: 92.4 fL (ref 78.0–100.0)
Platelets: 441 10*3/uL — ABNORMAL HIGH (ref 150–400)
RBC: 4.5 MIL/uL (ref 3.87–5.11)
RDW: 14.8 % (ref 11.5–15.5)
WBC: 10.8 10*3/uL — ABNORMAL HIGH (ref 4.0–10.5)

## 2014-06-06 LAB — PREGNANCY, URINE: Preg Test, Ur: NEGATIVE

## 2014-06-06 SURGERY — MULTIPLE EXTRACTION WITH ALVEOLOPLASTY
Anesthesia: General

## 2014-06-06 MED ORDER — EPHEDRINE SULFATE 50 MG/ML IJ SOLN
INTRAMUSCULAR | Status: DC | PRN
  Administered 2014-06-06 (×3): 5 mg via INTRAVENOUS

## 2014-06-06 MED ORDER — OXYMETAZOLINE HCL 0.05 % NA SOLN
NASAL | Status: AC
  Filled 2014-06-06: qty 15

## 2014-06-06 MED ORDER — OXYMETAZOLINE HCL 0.05 % NA SOLN
NASAL | Status: DC | PRN
  Administered 2014-06-06: 1 via NASAL

## 2014-06-06 MED ORDER — LIDOCAINE HCL (CARDIAC) 20 MG/ML IV SOLN
INTRAVENOUS | Status: AC
Start: 2014-06-06 — End: 2014-06-06
  Filled 2014-06-06: qty 5

## 2014-06-06 MED ORDER — MIDAZOLAM HCL 5 MG/5ML IJ SOLN
INTRAMUSCULAR | Status: DC | PRN
  Administered 2014-06-06: 2 mg via INTRAVENOUS

## 2014-06-06 MED ORDER — SUCCINYLCHOLINE CHLORIDE 20 MG/ML IJ SOLN
INTRAMUSCULAR | Status: DC | PRN
  Administered 2014-06-06: 100 mg via INTRAVENOUS

## 2014-06-06 MED ORDER — ONDANSETRON HCL 4 MG/2ML IJ SOLN
INTRAMUSCULAR | Status: AC
  Filled 2014-06-06: qty 2

## 2014-06-06 MED ORDER — 0.9 % SODIUM CHLORIDE (POUR BTL) OPTIME
TOPICAL | Status: DC | PRN
  Administered 2014-06-06: 1000 mL

## 2014-06-06 MED ORDER — PROPOFOL 10 MG/ML IV BOLUS
INTRAVENOUS | Status: DC | PRN
  Administered 2014-06-06: 150 mg via INTRAVENOUS

## 2014-06-06 MED ORDER — SODIUM CHLORIDE 0.9 % IR SOLN
Status: DC | PRN
  Administered 2014-06-06: 1000 mL

## 2014-06-06 MED ORDER — MIDAZOLAM HCL 2 MG/2ML IJ SOLN
INTRAMUSCULAR | Status: AC
  Filled 2014-06-06: qty 2

## 2014-06-06 MED ORDER — LIDOCAINE HCL (CARDIAC) 20 MG/ML IV SOLN
INTRAVENOUS | Status: DC | PRN
  Administered 2014-06-06: 100 mg via INTRAVENOUS

## 2014-06-06 MED ORDER — ONDANSETRON HCL 4 MG/2ML IJ SOLN
INTRAMUSCULAR | Status: DC | PRN
  Administered 2014-06-06: 4 mg via INTRAVENOUS

## 2014-06-06 MED ORDER — PROMETHAZINE HCL 25 MG/ML IJ SOLN: 6.2500 mg | INTRAMUSCULAR | Status: DC | PRN

## 2014-06-06 MED ORDER — FENTANYL CITRATE 0.05 MG/ML IJ SOLN
INTRAMUSCULAR | Status: DC | PRN
  Administered 2014-06-06: 100 ug via INTRAVENOUS
  Administered 2014-06-06: 50 ug via INTRAVENOUS

## 2014-06-06 MED ORDER — GLYCOPYRROLATE 0.2 MG/ML IJ SOLN
INTRAMUSCULAR | Status: DC | PRN
  Administered 2014-06-06: 0.2 mg via INTRAVENOUS

## 2014-06-06 MED ORDER — DEXAMETHASONE SODIUM PHOSPHATE 4 MG/ML IJ SOLN
INTRAMUSCULAR | Status: DC | PRN
Start: 2014-06-06 — End: 2014-06-06
  Administered 2014-06-06: 4 mg via INTRAVENOUS

## 2014-06-06 MED ORDER — LACTATED RINGERS IV SOLN
INTRAVENOUS | Status: DC
  Administered 2014-06-06: 07:00:00 via INTRAVENOUS

## 2014-06-06 MED ORDER — LACTATED RINGERS IV SOLN
INTRAVENOUS | Status: DC | PRN
  Administered 2014-06-06: 09:00:00 via INTRAVENOUS

## 2014-06-06 MED ORDER — FENTANYL CITRATE 0.05 MG/ML IJ SOLN
INTRAMUSCULAR | Status: AC
Start: 2014-06-06 — End: 2014-06-06
  Filled 2014-06-06: qty 5

## 2014-06-06 MED ORDER — LIDOCAINE-EPINEPHRINE 2 %-1:100000 IJ SOLN
INTRAMUSCULAR | Status: DC | PRN
  Administered 2014-06-06: 20 mL via INTRADERMAL

## 2014-06-06 MED ORDER — LIDOCAINE-EPINEPHRINE 2 %-1:100000 IJ SOLN
INTRAMUSCULAR | Status: AC
  Filled 2014-06-06: qty 1

## 2014-06-06 MED ORDER — FENTANYL CITRATE 0.05 MG/ML IJ SOLN
25.0000 ug | INTRAMUSCULAR | Status: DC | PRN
  Administered 2014-06-06: 50 ug via INTRAVENOUS

## 2014-06-06 MED ORDER — OXYCODONE-ACETAMINOPHEN 5-325 MG PO TABS
1.0000 | ORAL_TABLET | ORAL | Status: DC | PRN
Start: 2014-06-06 — End: 2014-07-08

## 2014-06-06 MED ORDER — FENTANYL CITRATE 0.05 MG/ML IJ SOLN
INTRAMUSCULAR | Status: AC
  Filled 2014-06-06: qty 2

## 2014-06-06 MED ORDER — MEPERIDINE HCL 25 MG/ML IJ SOLN: 6.2500 mg | INTRAMUSCULAR | Status: DC | PRN

## 2014-06-06 MED ORDER — ARTIFICIAL TEARS OP OINT
TOPICAL_OINTMENT | OPHTHALMIC | Status: DC | PRN
  Administered 2014-06-06: 1 via OPHTHALMIC

## 2014-06-06 SURGICAL SUPPLY — 32 items
BLADE SURG 15 STRL LF DISP TIS (BLADE) ×1 IMPLANT
BLADE SURG 15 STRL SS (BLADE) ×3
BUR CROSS CUT FISSURE 1.6 (BURR) ×2 IMPLANT
BUR CROSS CUT FISSURE 1.6MM (BURR) ×1
BUR EGG ELITE 4.0 (BURR) ×1 IMPLANT
BUR EGG ELITE 4.0MM (BURR) ×1
CANISTER SUCTION 2500CC (MISCELLANEOUS) ×3 IMPLANT
COVER SURGICAL LIGHT HANDLE (MISCELLANEOUS) ×3 IMPLANT
CRADLE DONUT ADULT HEAD (MISCELLANEOUS) ×3 IMPLANT
GAUZE PACKING FOLDED 2  STR (GAUZE/BANDAGES/DRESSINGS) ×2
GAUZE PACKING FOLDED 2 STR (GAUZE/BANDAGES/DRESSINGS) ×1 IMPLANT
GLOVE BIO SURGEON STRL SZ 6.5 (GLOVE) ×4 IMPLANT
GLOVE BIO SURGEON STRL SZ7.5 (GLOVE) ×3 IMPLANT
GLOVE BIO SURGEONS STRL SZ 6.5 (GLOVE) ×2
GLOVE BIOGEL PI IND STRL 7.0 (GLOVE) ×2 IMPLANT
GLOVE BIOGEL PI INDICATOR 7.0 (GLOVE) ×4
GOWN STRL REUS W/ TWL LRG LVL3 (GOWN DISPOSABLE) ×1 IMPLANT
GOWN STRL REUS W/ TWL XL LVL3 (GOWN DISPOSABLE) ×1 IMPLANT
GOWN STRL REUS W/TWL LRG LVL3 (GOWN DISPOSABLE) ×3
GOWN STRL REUS W/TWL XL LVL3 (GOWN DISPOSABLE) ×3
KIT BASIN OR (CUSTOM PROCEDURE TRAY) ×3 IMPLANT
KIT ROOM TURNOVER OR (KITS) ×3 IMPLANT
NEEDLE 22X1 1/2 (OR ONLY) (NEEDLE) ×4 IMPLANT
NS IRRIG 1000ML POUR BTL (IV SOLUTION) ×3 IMPLANT
PAD ARMBOARD 7.5X6 YLW CONV (MISCELLANEOUS) ×6 IMPLANT
SCRUB BETADINE 4OZ XXX (MISCELLANEOUS) IMPLANT
SUT CHROMIC 3 0 PS 2 (SUTURE) ×6 IMPLANT
SYRINGE 10CC LL (SYRINGE) ×5 IMPLANT
TOWEL OR 17X26 10 PK STRL BLUE (TOWEL DISPOSABLE) ×3 IMPLANT
TRAY ENT MC OR (CUSTOM PROCEDURE TRAY) ×3 IMPLANT
TUBING IRRIGATION (MISCELLANEOUS) ×3 IMPLANT
YANKAUER SUCT BULB TIP NO VENT (SUCTIONS) ×3 IMPLANT

## 2014-06-06 NOTE — Discharge Instructions (Signed)
What to Eat after Tooth extraction:     For your first meals, you should eat lightly; only small meals at first.   Avoid Sharp, Crunchy, and Hot foods.   If you do not have nausea, you may eat larger meals.  Avoid spicy, greasy and heavy food, as these may make you sick after the anesthesia.  General Anesthesia, Adult, Care After  Refer to this sheet in the next few weeks. These instructions provide you with information on caring for yourself after your procedure. Your health care provider may also give you more specific instructions. Your treatment has been planned according to current medical practices, but problems sometimes occur. Call your health care provider if you have any problems or questions after your procedure.  WHAT TO EXPECT AFTER THE PROCEDURE  After the procedure, it is typical to experience:  Sleepiness.  Nausea and vomiting. HOME CARE INSTRUCTIONS  For the first 24 hours after general anesthesia:  Have a responsible person with you.  Do not drive a car. If you are alone, do not take public transportation.  Do not drink alcohol.  Do not take medicine that has not been prescribed by your health care provider.  Do not sign important papers or make important decisions.  You may resume a normal diet and activities as directed by your health care provider.  Change bandages (dressings) as directed.  If you have questions or problems that seem related to general anesthesia, call the hospital and ask for the anesthetist or anesthesiologist on call. SEEK MEDICAL CARE IF:  You have nausea and vomiting that continue the day after anesthesia.  You develop a rash. SEEK IMMEDIATE MEDICAL CARE IF:  You have difficulty breathing.  You have chest pain.  You have any allergic problems. Document Released: 08/15/2000 Document Revised: 01/09/2013 Document Reviewed: 11/22/2012  [facility] Patient Information 2014 [place], [place].   Sinus precautions after surgery:  1. Avoid blowing your  nose.  2. Avoid sneezing. If you might sneeze, Keep her mouth open and do not pinch your nose closed.  3. Avoid sucking on straws. Avoid smoking.  4. Do not lift objects weighing more than 20 pounds  Call if questions arise.   MOUTH CARE AFTER SURGERY  FACTS:  Ice used in ice bag helps keep the swelling down, and can help lessen the pain.  It is easier to treat pain BEFORE it happens.  Spitting disturbs the clot and may cause bleeding to start again, or to get worse.  Smoking delays healing and can cause complications.  Sharing prescriptions can be dangerous. Do not take medications not recently prescribed for you.  Antibiotics may stop birth control pills from working. Use other means of birth control while on antibiotics.  Warm salt water rinses after the first 24 hours will help lessen the swelling: Use 1/2 teaspoonful of table salt per oz.of water. DO NOT:  Do not spit. Do not drink through a straw.  Strongly advised not to smoke, dip snuff or chew tobacco at least for 3 days.  Do not eat sharp or crunchy foods. Avoid the area of surgery when chewing.  Do not stop your antibiotics before your instructions say to do so.  Do not eat hot foods until bleeding has stopped. If you need to, let your food cool down to room temperature. EXPECT:  Some swelling, especially first 2-3 days.  Soreness or discomfort in varying degrees. Follow your dentist's instructions about how to handle pain before it starts.  Pinkish saliva  or light blood in saliva, or on your pillow in the morning. This can last around 24 hours.  Bruising inside or outside the mouth. This may not show up until 2-3 days after surgery. Don't worry, it will go away in time.  Pieces of "bone" may work themselves loose. It's OK. If they bother you, let us know. WHAT TO DO IMMEDIATELY AFTER SURGERY:  Bite on the gauze with steady pressure for 1-2 hours. Don't chew on the gauze.  Do not lie down flat. Raise your head support  especially for the first 24 hours.  Apply ice to your face on the side of the surgery. You may apply it 20 minutes on and a few minutes off. Ice for 8-12 hours. You may use ice up to 24 hours.  Before the numbness wears off, take a pain pill as instructed.  Prescription pain medication is not always required. SWELLING:  Expect swelling for the first couple of days. It should get better after that.  If swelling increases 3 days or so after surgery; let us know as soon as possible. FEVER:  Take Tylenol every 4 hours if needed to lower your temperature, especially if it is at 100F or higher.  Drink lots of fluids.  If the fever does not go away, let us know. BREATHING TROUBLE:  Any unusual difficulty breathing means you have to have someone bring you to the emergency room ASAP BLEEDING:  Light oozing is expected for 24 hours or so.  Prop head up with pillows  Avoid spitting  Do not confuse bright red fresh flowing blood with lots of saliva colored with a little bit of blood.  If you notice some bleeding, place gauze or a tea bag where it is bleeding and apply CONSTANT pressure by biting down for 1 hour. Avoid talking during this time. Do not remove the gauze or tea bag during this hour to "check" the bleeding.  If you notice bright RED bleeding FLOWING out of particular area, and filling the floor of your mouth, put a wad of gauze on that area, bite down firmly and constantly. Call us immediately. If we're closed, have someone bring you to the emergency room. ORAL HYGIENE:  Brush your teeth as usual after meals and before bedtime.  Use a soft toothbrush around the area of surgery.  DO NOT AVOID BRUSHING. Otherwise bacteria(germs) will grow and may delay healing or encourage infection.  Since you cannot spit, just gently rinse and let the water flow out of your mouth.  DO NOT SWISH HARD. EATING:  Cool liquids are a good point to start. Increase to soft foods as tolerated. PRESCRIPTIONS:    Follow the directions for your prescriptions exactly as written.  If your doctor gave you a narcotic pain medication, do not drive, operate machinery or drink alcohol when on that medication. QUESTIONS:  Call our office during office hours

## 2014-06-06 NOTE — Anesthesia Procedure Notes (Signed)
Procedure Name: Intubation Date/Time: 06/06/2014 9:20 AM Performed by: Ollen Bowl Pre-anesthesia Checklist: Patient identified, Emergency Drugs available, Suction available, Patient being monitored and Timeout performed Patient Re-evaluated:Patient Re-evaluated prior to inductionOxygen Delivery Method: Circle system utilized and Simple face mask Preoxygenation: Pre-oxygenation with 100% oxygen Intubation Type: IV induction Ventilation: Mask ventilation without difficulty Grade View: Grade II Nasal Tubes: Left Tube size: 7.0 mm Number of attempts: 1 Airway Equipment and Method: Patient positioned with wedge pillow Placement Confirmation: ETT inserted through vocal cords under direct vision,  positive ETCO2 and breath sounds checked- equal and bilateral Tube secured with: Tape Dental Injury: Teeth and Oropharynx as per pre-operative assessment

## 2014-06-06 NOTE — Transfer of Care (Signed)
Immediate Anesthesia Transfer of Care Note  Patient: Latasha Parker  Procedure(s) Performed: Procedure(s): MULTIPLE EXTRACTIONS WITH ALVEOLOPLASTY/REMOVAL OF TORI (N/A)  Patient Location: PACU  Anesthesia Type:General  Level of Consciousness: awake, alert , oriented and patient cooperative  Airway & Oxygen Therapy: Patient Spontanous Breathing and Patient connected to nasal cannula oxygen  Post-op Assessment: Report given to PACU RN and Post -op Vital signs reviewed and stable  Post vital signs: Reviewed and stable  Complications: No apparent anesthesia complications

## 2014-06-06 NOTE — Op Note (Signed)
NAME:  Latasha Parker, Latasha Parker               ACCOUNT NO.:  [id number]  MEDICAL RECORD NO.:  [id number]  LOCATION:  [facility]                         FACILITY:  [facility]  PHYSICIAN:  Gae Bon, M.D.  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 9, 10, 11, 12, 13, 14,  20, 21, 22, 23, 24, 25, 26, 27, 28, 29, 30, bilateral mandibular lingual tori.  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 9, 10, 11, 12, 13, 14,  20, 21, 22, 23, 24, 25, 26, 27, 28, 29, 30, bilateral mandibular lingual tori.  PROCEDURE:  Extraction of teeth #3, 4, 5, 6, 7, 8, 9, 10, 11, 12, 13, 14, 20, 21, 22, 23, 24, 25, 26, 27, 28, 29, 30, alveoplasty right and left maxilla and mandible, removal of bilateral mandibular lingual tori.  SURGEON:  Gae Bon, MD  ANESTHESIA:  General, Dr. Guido Sander attending.  DESCRIPTION OF PROCEDURE:  The patient was taken to the operating room, placed on the table in supine position.  General anesthesia was administered intravenously and a nasal endotracheal tube was placed and secured.  The patient was then draped for the procedure, and time-out was performed.  The posterior pharynx was suctioned.  A throat pack was placed.  A 2% lidocaine with 1:100,000 epinephrine was infiltrated in an inferior alveolar block on the right and left side of the mandible and then in buccal and palatal infiltration of the maxilla, total of 20 mL utilized.  A bite block was placed in the right side of the mouth, and a sweetheart retractor was used to retract the tongue.  A 15 blade was used to make a full-thickness incision beginning at tooth #20 in the mandible, carrying forward across the midline at tooth #27 on the buccal and lingual  aspects of the gingival sulcus.  In the maxilla, a 15 blade was used to make the incision beginning at tooth #14, carrying forward and across the midline to tooth #6 on the buccal and palatal aspects at the gingival sulcus.  The periosteum was reflected to expose these teeth, and the teeth were elevated with a 301 elevator in the mandible. The Asch forceps was used to extract teeth #20, 23, 24, 25, 26. Attempts were made to remove teeth #21, 22, but the teeth fractured requiring additional removal of bone with a Stryker handpiece. Eventually, these teeth were removed with the 301 elevator and rongeur. In the maxilla, 301 elevator was used to elevate the teeth and the upper teeth were removed with the upper universal forceps #14, 13, 12.  #11 fractured and required additional bone removal.  10, 9, 8 and 7 removed with the forceps.  The sockets were then curetted, and the periosteum was further reflected to expose the alveolar crest in the maxilla and mandible and to expose the lingual tori of the mandible.  A Seldin retractor was used to retract the lingual tissues, and an egg-shaped bur  and bone file were used to remove the lingual tori on the left side. Then, alveoplasty was performed in the maxilla and mandible using the egg-shaped bur and alveoplasty, and then the areas were irrigated and closed with 3-0 chromic.  The bite block and sweetheart retractor were repositioned to the other side of the mouth, and the 15 blade was used to make an incision in the mandible beginning at tooth #30 carrying forward to tooth #27 on the buccal and lingual surfaces in the gingival sulcus.  In the maxilla, the 15 blade was used starting at tooth #3 and carrying forward to tooth #6 on the buccal and palatal aspects in the gingival sulcus.  The periosteum was then reflected.  The teeth were elevated with a 301 elevator, and teeth #28 and 29 were removed with the Asch forceps.  Tooth #30 required  sectioning and removal of the root separately, and tooth #27 fractured and required additional bone removal prior to taking of this tooth.  In the maxilla, the 150 forceps was used to remove teeth #3, 4, and 5.  Tooth #6 required additional bone removal with the Stryker handpiece prior to removing this tooth.  Then, the periosteum was further reflected to expose the alveolar crest of the maxilla and mandible on the right and left side, and the torus of the right mandible.  The egg-shaped bur and bone file were used to remove the torus using the Seldin retractor to protect the lingual tissues, and then alveoplasty was performed in the right maxilla and mandible using the egg-shaped bur and bone file.  Then, the areas were irrigated and closed with 3-0 chromic.  The oral cavity was inspected and found to have good contour, hemostasis, and closure.  The oral cavity was irrigated and suctioned.  Throat pack was removed.  The patient was awakened, taken to the recovery room, breathing spontaneously in good condition.  ESTIMATED BLOOD LOSS:  Minimal.  COMPLICATIONS:  None.  SPECIMENS:  None.     Gae Bon, M.D.     SMJ/MEDQ  D:  06/06/2014  T:  06/06/2014  Job:  [id number]

## 2014-06-06 NOTE — H&P (Signed)
H&P documentation  -History and Physical Reviewed  -Patient has been re-examined  -No change in the plan of care  Latasha Parker  

## 2014-06-06 NOTE — Anesthesia Postprocedure Evaluation (Signed)
  Anesthesia Post-op Note  Patient: Latasha Parker  Procedure(s) Performed: Procedure(s): MULTIPLE EXTRACTIONS WITH ALVEOLOPLASTY/REMOVAL OF TORI (N/A)  Patient Location: PACU  Anesthesia Type:General  Level of Consciousness: awake and alert   Airway and Oxygen Therapy: Patient Spontanous Breathing and Patient connected to nasal cannula oxygen  Post-op Pain: mild  Post-op Assessment: Post-op Vital signs reviewed, Patient's Cardiovascular Status Stable, Respiratory Function Stable and Patent Airway  Post-op Vital Signs: Reviewed and stable  Last Vitals:  Filed Vitals:   06/06/14 1015  BP: 127/67  Pulse: 87  Temp: 36.7 C  Resp: 13    Complications: No apparent anesthesia complications

## 2014-06-06 NOTE — Op Note (Signed)
06/06/2014  9:57 AM  PATIENT:  Latasha Parker  39 y.o. female  PRE-OPERATIVE DIAGNOSIS:  NON RESTORABLE TEETH #'s 3, 4, 5, 6, 7, 8, 9, 10, 11, 12, 13, 14, 20, 21, 22, 23, 24, 25, 26, 27, 28, 29, 30, Bilateral Mandibular Lingual tori  POST-OPERATIVE DIAGNOSIS:  SAME  PROCEDURE:  Procedure(s): MULTIPLE EXTRACTIONS TEETH #'s 3, 4, 5, 6, 7, 8, 9, 10, 11, 12, 13, 14, 20, 21, 22, 23, 24, 25, 26, 27, 28, 29, 30;  ALVEOLOPLASTY; REMOVAL OF BILATERAL MANDIBULAR LINGUAL TORI  SURGEON:  Surgeon(s): Gae Bon, DDS  ANESTHESIA:   local and general  EBL:  minimal  DRAINS: none   SPECIMEN:  No Specimen  COUNTS:  YES  PLAN OF CARE: Discharge to home after PACU  PATIENT DISPOSITION:  PACU - hemodynamically stable.   PROCEDURE DETAILS: Dictation #[id number]  Gae Bon, DMD 06/06/2014 9:57 AM

## 2014-06-09 ENCOUNTER — Encounter (HOSPITAL_COMMUNITY): Payer: Self-pay | Admitting: Oral Surgery

## 2014-06-09 ENCOUNTER — Ambulatory Visit (HOSPITAL_COMMUNITY): Admission: RE | Admit: 2014-06-09 | Payer: Medicaid Other | Source: Ambulatory Visit

## 2014-06-13 ENCOUNTER — Ambulatory Visit (HOSPITAL_COMMUNITY): Payer: Medicaid Other

## 2014-06-19 ENCOUNTER — Other Ambulatory Visit: Payer: Self-pay | Admitting: Obstetrics and Gynecology

## 2014-06-19 ENCOUNTER — Ambulatory Visit (INDEPENDENT_AMBULATORY_CARE_PROVIDER_SITE_OTHER): Payer: Medicaid Other | Admitting: Obstetrics & Gynecology

## 2014-06-19 ENCOUNTER — Ambulatory Visit (HOSPITAL_COMMUNITY)
Admission: RE | Admit: 2014-06-19 | Discharge: 2014-06-19 | Disposition: A | Discharge status: Home / Self Care | Payer: Medicaid Other | Source: Ambulatory Visit | Attending: Obstetrics and Gynecology | Admitting: Obstetrics and Gynecology

## 2014-06-19 ENCOUNTER — Encounter: Payer: Self-pay | Admitting: Obstetrics & Gynecology

## 2014-06-19 ENCOUNTER — Other Ambulatory Visit (HOSPITAL_COMMUNITY)
Admission: RE | Admit: 2014-06-19 | Discharge: 2014-06-19 | Disposition: A | Discharge status: Home / Self Care | Payer: Medicaid Other | Source: Ambulatory Visit | Attending: Obstetrics & Gynecology | Admitting: Obstetrics & Gynecology

## 2014-06-19 VITALS — BP 110/49 | HR 72 | Ht 68.0 in | Wt 166.3 lb

## 2014-06-19 DIAGNOSIS — N946 Dysmenorrhea, unspecified: Secondary | ICD-10-CM

## 2014-06-19 DIAGNOSIS — Z1151 Encounter for screening for human papillomavirus (HPV): Secondary | ICD-10-CM | POA: Insufficient documentation

## 2014-06-19 DIAGNOSIS — D369 Benign neoplasm, unspecified site: Secondary | ICD-10-CM | POA: Insufficient documentation

## 2014-06-19 DIAGNOSIS — Z124 Encounter for screening for malignant neoplasm of cervix: Secondary | ICD-10-CM

## 2014-06-19 DIAGNOSIS — Z113 Encounter for screening for infections with a predominantly sexual mode of transmission: Secondary | ICD-10-CM

## 2014-06-19 DIAGNOSIS — R102 Pelvic and perineal pain: Secondary | ICD-10-CM

## 2014-06-19 DIAGNOSIS — Z01419 Encounter for gynecological examination (general) (routine) without abnormal findings: Secondary | ICD-10-CM | POA: Insufficient documentation

## 2014-06-19 DIAGNOSIS — Z0000 Encounter for general adult medical examination without abnormal findings: Secondary | ICD-10-CM

## 2014-06-19 DIAGNOSIS — N949 Unspecified condition associated with female genital organs and menstrual cycle: Secondary | ICD-10-CM | POA: Insufficient documentation

## 2014-06-19 DIAGNOSIS — Z118 Encounter for screening for other infectious and parasitic diseases: Secondary | ICD-10-CM

## 2014-06-19 NOTE — Progress Notes (Signed)
   Subjective:    Patient ID: Latasha Parker, female    DOB: 03-27-1976, 39 y.o.   MRN: [id number]  HPI  39 yo SWP2 here today to discuss pelvic pain, bilateral since 2003. She saw Dr. Ihor Dow in June who recommended a Mirena, but she did not come back because she does not want that. She has pain with sex, avoids it sometimes due to the pain. It occurs with walking all day, MUCH worse with her period. She and her mother are adament that she would like her uterus.   Review of Systems U/S normal today except for a stable right dermoid. The left ovary has been removed. She denies using any illegal substances. Pap smear is due.     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 We also discussed that removing her only remaining ovary would leave her menopausal. She understands that she may want ERT. Pelvic pain- She is adament that she wants her uterus removed. She is aware that there are risks associated with surgery.

## 2014-06-19 NOTE — Addendum Note (Signed)
Addended by: Rutherford Nail E on: 06/19/2014 05:02 PM   Modules accepted: Orders

## 2014-06-23 LAB — CYTOLOGY - PAP

## 2014-07-08 ENCOUNTER — Telehealth: Payer: Self-pay

## 2014-07-08 MED ORDER — OXYCODONE-ACETAMINOPHEN 5-325 MG PO TABS: 1.0000 | ORAL_TABLET | ORAL | Status: DC | PRN

## 2014-07-08 NOTE — Telephone Encounter (Signed)
Pt called with deaf interpreter and asked if she could have something for her severe pain during her periods.  Pt stated that she has tried a heating pad, ibuprofen, tylenol, and nothing helps with the pain.  Pt informed me that percocet usually helps and that she needs it to last because she will be having another period before her hysterectomy scheduled on 08/12/14.  She also informed me that her pain is off/on for the duration of her period that last 4-7 days.    I informed pt that I would contact Dr. Hulan Fray to see if she will refill another Rx for pain.  Spoke with Dr. Hulan Fray and was advised that she can have one refill on her percocet but she would need to make sure that it lasted until her surgery on 08/12/14.   Attempted to contact and LM that the provider filled her prescription that she will need to bring ID to pick up prescription at the front desk and to please come after 3pm in which the prescription will be ready.  If she has any questions to please give the office a call.

## 2014-07-18 ENCOUNTER — Ambulatory Visit (INDEPENDENT_AMBULATORY_CARE_PROVIDER_SITE_OTHER): Payer: Self-pay | Admitting: Adult Health

## 2014-07-18 ENCOUNTER — Encounter: Payer: Self-pay | Admitting: Adult Health

## 2014-07-18 VITALS — BP 116/72 | HR 84 | Temp 98.1°F | Ht 68.0 in | Wt 169.0 lb

## 2014-07-18 DIAGNOSIS — J449 Chronic obstructive pulmonary disease, unspecified: Secondary | ICD-10-CM

## 2014-07-18 MED ORDER — ALPRAZOLAM 0.5 MG PO TABS: 0.5000 mg | ORAL_TABLET | Freq: Three times a day (TID) | ORAL | Status: DC | PRN

## 2014-07-18 NOTE — Progress Notes (Signed)
   Subjective:    Patient ID: Latasha Parker, female    DOB: January 15, 1976, 39 y.o.   MRN: [id number]  HPI 39 yo smoker with Asthma smoker   TESTS: CXR 09/05/13 >> no acute disease PFT 01/21/14 >> borderline obstruction, normal lung volumes/diffusion, no BD response  07/18/2014 Follow up : Asthma/Smoker  Patient returns for a followup .  She is accompanied by a sign interpreter Overall says that she is doing well. He remains on Qvar twice daily. No flare in cough or wheezing Uses her ProAir rescue inhaler intermittently. Denies any chest pain, orthopnea, PND, leg swelling or nocturnal awakenings. Does have an upcoming appointment with behavioral health for anxiety. Requests a refill of alprazolam  which helps her sleep. Patient education on anxiolytics. Unfortunately, patient continues to smoke, advised on cessation.      TESTS: CXR 09/05/13 >> no acute disease    Review of Systems Constitutional:   No  weight loss, night sweats,  Fevers, chills, fatigue, or  lassitude.  HEENT:   No headaches,  Difficulty swallowing,  Tooth/dental problems, or  Sore throat,                No sneezing, itching, ear ache,  +nasal congestion, post nasal drip,   CV:  No chest pain,  Orthopnea, PND, swelling in lower extremities, anasarca, dizziness, palpitations, syncope.   GI  No heartburn, indigestion, abdominal pain, nausea, vomiting, diarrhea, change in bowel habits, loss of appetite, bloody stools.   Resp:    No chest wall deformity  Skin: no rash or lesions.  GU: no dysuria, change in color of urine, no urgency or frequency.  No flank pain, no hematuria   MS:  No joint pain or swelling.  No decreased range of motion.  No back pain.  Psych:  No change in mood or affect. No depression or anxiety.  No memory loss.         Objective:   Physical Exam GEN: A/Ox3; pleasant , NAD,  Deaf-interpreter present   HEENT:  Basin/AT,  EACs-clear, TMs-wnl, NOSE-clear, THROAT-clear, no lesions,  no postnasal drip or exudate noted.   NECK:  Supple w/ fair ROM; no JVD; normal carotid impulses w/o bruits; no thyromegaly or nodules palpated; no lymphadenopathy.  RESP  Clear  P & A; w/o, wheezes/ rales/ or rhonchi.no accessory muscle use, no dullness to percussion  CARD:  RRR, no m/r/g  , no peripheral edema, pulses intact, no cyanosis or clubbing.  GI:   Soft & nt; nml bowel sounds; no organomegaly or masses detected.  Musco: Warm bil, no deformities or joint swelling noted.   Neuro: alert, no focal deficits noted.    Skin: Warm, no lesions or rashes         Assessment & Plan:

## 2014-07-18 NOTE — Patient Instructions (Signed)
Most important goal is to quit smoking .  Continue on QVAR 63mcg 2 puffs Twice daily  , rinse after use.  Follow up Dr. Halford Chessman  In 4-6 months and As needed

## 2014-07-18 NOTE — Assessment & Plan Note (Signed)
Asthma and an active smoker She is doing well on Qvar Continue to work on smoking cessation  Plan  Most important goal is to quit smoking .  Continue on QVAR 49mcg 2 puffs Twice daily  , rinse after use.  Follow up Dr. Halford Chessman  In 4-6 months and As needed

## 2014-07-18 NOTE — Progress Notes (Signed)
Reviewed and agree with assessment/plan. 

## 2014-07-24 ENCOUNTER — Telehealth: Payer: Self-pay | Admitting: Pulmonary Disease

## 2014-07-24 ENCOUNTER — Other Ambulatory Visit: Payer: Self-pay | Admitting: Pulmonary Disease

## 2014-07-24 NOTE — Telephone Encounter (Signed)
Pt saw TP on 07/18/14 and given RX for alprazolam 0.5 mg 1 po TID PRN #45 x 0 refills Pt is requesting a 90 day supply instead. Please advise VS thanks

## 2014-07-24 NOTE — Telephone Encounter (Signed)
Okay to change to 90 day supply.  Please make sure initial script gets canceled before sending script for 90 day supply.

## 2014-07-25 ENCOUNTER — Telehealth: Payer: Self-pay | Admitting: Pulmonary Disease

## 2014-07-25 MED ORDER — ALPRAZOLAM 0.5 MG PO TABS: 0.5000 mg | ORAL_TABLET | Freq: Three times a day (TID) | ORAL | Status: DC | PRN

## 2014-07-25 NOTE — Telephone Encounter (Signed)
Already a msg on this.Latasha Parker

## 2014-07-25 NOTE — Telephone Encounter (Signed)
Pt call back about this refill, told her that VS approved rx, pt wanting to know if it can be sent to [facility] on [address] or if she has to pick it up, please advise and contact pt.Latasha Parker

## 2014-07-25 NOTE — Telephone Encounter (Signed)
Rx has been called in per VS. Pt is aware. Nothing further was needed. 

## 2014-07-28 ENCOUNTER — Other Ambulatory Visit: Payer: Self-pay | Admitting: Pulmonary Disease

## 2014-08-04 ENCOUNTER — Telehealth: Payer: Self-pay | Admitting: General Practice

## 2014-08-04 NOTE — Telephone Encounter (Signed)
Patient called in to front office with relay interpreter stating she called last month to get a refill on a medication but she forgot to give [place] her new number and thinks we called the wrong one. Patient states she wants to know if we still have her prescription. Also, she thinks she received a call about her surgery on 3/22 but didn't get a message. Per chart review, percocet was refilled and Rx is still in front office. Told patient she may come by to pick up her prescription before 430pm today. Asked patient if she has had her pre op appt yet or if that has been scheduled. Patient verbalized understanding and states no she doesn't have that appt yet. Called pre op and left message for them to call us back with patient appt. Pre op numbers are [phone] or [phone]. Told patient I have left a message for them and when they call us back with that appt we will call her. Patient verbalized understanding and had no other questions. Patient will await our call

## 2014-08-05 NOTE — Telephone Encounter (Signed)
Called pre op at [phone] and spoke with Marcie Bal who stated she reached the person this morning and set up a pre op appt.

## 2014-08-07 ENCOUNTER — Encounter (HOSPITAL_COMMUNITY)
Admission: RE | Admit: 2014-08-07 | Discharge: 2014-08-07 | Disposition: A | Discharge status: Home / Self Care | Payer: Medicaid Other | Source: Ambulatory Visit | Attending: Obstetrics & Gynecology | Admitting: Obstetrics & Gynecology

## 2014-08-07 DIAGNOSIS — Z01812 Encounter for preprocedural laboratory examination: Secondary | ICD-10-CM | POA: Diagnosis present

## 2014-08-07 LAB — CBC
HEMATOCRIT: 39.4 % (ref 36.0–46.0)
Hemoglobin: 13.4 g/dL (ref 12.0–15.0)
MCH: 31.9 pg (ref 26.0–34.0)
MCHC: 34 g/dL (ref 30.0–36.0)
MCV: 93.8 fL (ref 78.0–100.0)
Platelets: 417 10*3/uL — ABNORMAL HIGH (ref 150–400)
RBC: 4.2 MIL/uL (ref 3.87–5.11)
RDW: 16.1 % — AB (ref 11.5–15.5)
WBC: 7.3 10*3/uL (ref 4.0–10.5)

## 2014-08-07 NOTE — Patient Instructions (Signed)
   Your procedure is scheduled on: MARCH 22 AT 730AM  Enter through the Main Entrance of [facility] at: 6AM  Pick up the phone at the desk and dial [phone] and inform us of your arrival.  Please call this number if you have any problems the morning of surgery: [phone]  Remember: Do not eat food after midnight: MARCH 21 Do not drink clear liquids after: MARCH 21 Take these medicines the morning of surgery with a SIP OF WATER: NONE  Do not wear jewelry, make-up, or FINGER nail polish No metal in your hair or on your body. Do not wear lotions, powders, perfumes.  You may wear deodorant.  Do not bring valuables to the hospital. Contacts, dentures or bridgework may not be worn into surgery.  Leave suitcase in the car. After Surgery it may be brought to your room. For patients being admitted to the hospital, checkout time is 11:00am the day of discharge.    Patients discharged on the day of surgery will not be allowed to drive home.

## 2014-08-12 ENCOUNTER — Encounter (HOSPITAL_COMMUNITY)
Admission: RE | Disposition: A | Discharge status: Home / Self Care | Payer: Self-pay | Source: Ambulatory Visit | Attending: Obstetrics & Gynecology

## 2014-08-12 ENCOUNTER — Encounter (HOSPITAL_COMMUNITY): Payer: Self-pay | Admitting: Anesthesiology

## 2014-08-12 ENCOUNTER — Ambulatory Visit (HOSPITAL_COMMUNITY): Payer: Medicaid Other | Admitting: Anesthesiology

## 2014-08-12 ENCOUNTER — Observation Stay (HOSPITAL_COMMUNITY)
Admission: RE | Admit: 2014-08-12 | Discharge: 2014-08-13 | Disposition: A | Discharge status: Home / Self Care | Payer: Medicaid Other | Source: Ambulatory Visit | Attending: Obstetrics & Gynecology | Admitting: Obstetrics & Gynecology

## 2014-08-12 DIAGNOSIS — Z79899 Other long term (current) drug therapy: Secondary | ICD-10-CM | POA: Insufficient documentation

## 2014-08-12 DIAGNOSIS — M1990 Unspecified osteoarthritis, unspecified site: Secondary | ICD-10-CM | POA: Diagnosis not present

## 2014-08-12 DIAGNOSIS — J45909 Unspecified asthma, uncomplicated: Secondary | ICD-10-CM | POA: Insufficient documentation

## 2014-08-12 DIAGNOSIS — D270 Benign neoplasm of right ovary: Secondary | ICD-10-CM | POA: Insufficient documentation

## 2014-08-12 DIAGNOSIS — Z885 Allergy status to narcotic agent status: Secondary | ICD-10-CM | POA: Diagnosis not present

## 2014-08-12 DIAGNOSIS — F17210 Nicotine dependence, cigarettes, uncomplicated: Secondary | ICD-10-CM | POA: Diagnosis not present

## 2014-08-12 DIAGNOSIS — R102 Pelvic and perineal pain: Principal | ICD-10-CM | POA: Insufficient documentation

## 2014-08-12 DIAGNOSIS — G8929 Other chronic pain: Secondary | ICD-10-CM | POA: Diagnosis present

## 2014-08-12 DIAGNOSIS — E0590 Thyrotoxicosis, unspecified without thyrotoxic crisis or storm: Secondary | ICD-10-CM | POA: Diagnosis not present

## 2014-08-12 DIAGNOSIS — Z9889 Other specified postprocedural states: Secondary | ICD-10-CM

## 2014-08-12 HISTORY — PX: LAPAROSCOPIC ASSISTED VAGINAL HYSTERECTOMY: SHX5398

## 2014-08-12 HISTORY — PX: LAPAROSCOPIC SALPINGO OOPHERECTOMY: SHX5927

## 2014-08-12 LAB — PREGNANCY, URINE: Preg Test, Ur: NEGATIVE

## 2014-08-12 SURGERY — HYSTERECTOMY, VAGINAL, LAPAROSCOPY-ASSISTED
Anesthesia: General | Site: Vagina | Laterality: Right

## 2014-08-12 MED ORDER — HYDROMORPHONE HCL 1 MG/ML IJ SOLN
INTRAMUSCULAR | Status: DC | PRN
  Administered 2014-08-12 (×2): 1 mg via INTRAVENOUS

## 2014-08-12 MED ORDER — BUPIVACAINE HCL (PF) 0.5 % IJ SOLN
INTRAMUSCULAR | Status: DC | PRN
  Administered 2014-08-12: 25 mL

## 2014-08-12 MED ORDER — MEPERIDINE HCL 25 MG/ML IJ SOLN: 6.2500 mg | INTRAMUSCULAR | Status: DC | PRN

## 2014-08-12 MED ORDER — GLYCOPYRROLATE 0.2 MG/ML IJ SOLN
INTRAMUSCULAR | Status: DC | PRN
  Administered 2014-08-12: 0.2 mg via INTRAVENOUS
  Administered 2014-08-12: 0.4 mg via INTRAVENOUS

## 2014-08-12 MED ORDER — SCOPOLAMINE 1 MG/3DAYS TD PT72
MEDICATED_PATCH | TRANSDERMAL | Status: AC
  Administered 2014-08-12: 1.5 mg via TRANSDERMAL
  Filled 2014-08-12: qty 1

## 2014-08-12 MED ORDER — HYDROMORPHONE HCL 1 MG/ML IJ SOLN
INTRAMUSCULAR | Status: AC
  Filled 2014-08-12: qty 1

## 2014-08-12 MED ORDER — MIDAZOLAM HCL 2 MG/2ML IJ SOLN
INTRAMUSCULAR | Status: AC
  Filled 2014-08-12: qty 2

## 2014-08-12 MED ORDER — BUPIVACAINE-EPINEPHRINE (PF) 0.5% -1:200000 IJ SOLN
INTRAMUSCULAR | Status: AC
  Filled 2014-08-12: qty 30

## 2014-08-12 MED ORDER — BUPIVACAINE HCL (PF) 0.5 % IJ SOLN
INTRAMUSCULAR | Status: AC
  Filled 2014-08-12: qty 30

## 2014-08-12 MED ORDER — FENTANYL CITRATE 0.05 MG/ML IJ SOLN
INTRAMUSCULAR | Status: AC
  Filled 2014-08-12: qty 5

## 2014-08-12 MED ORDER — BUPIVACAINE-EPINEPHRINE 0.5% -1:200000 IJ SOLN
INTRAMUSCULAR | Status: DC | PRN
  Administered 2014-08-12: 30 mL

## 2014-08-12 MED ORDER — ROCURONIUM BROMIDE 100 MG/10ML IV SOLN
INTRAVENOUS | Status: DC | PRN
  Administered 2014-08-12: 40 mg via INTRAVENOUS

## 2014-08-12 MED ORDER — PROPOFOL 10 MG/ML IV BOLUS
INTRAVENOUS | Status: DC | PRN
  Administered 2014-08-12: 180 mg via INTRAVENOUS

## 2014-08-12 MED ORDER — FENTANYL CITRATE 0.05 MG/ML IJ SOLN
INTRAMUSCULAR | Status: AC
  Administered 2014-08-12: 50 ug via INTRAVENOUS
  Filled 2014-08-12: qty 2

## 2014-08-12 MED ORDER — SODIUM CHLORIDE 0.9 % IJ SOLN
INTRAMUSCULAR | Status: DC | PRN
  Administered 2014-08-12: 100 mL

## 2014-08-12 MED ORDER — CEFAZOLIN SODIUM-DEXTROSE 2-3 GM-% IV SOLR
2.0000 g | INTRAVENOUS | Status: AC
  Administered 2014-08-12: 2 g via INTRAVENOUS

## 2014-08-12 MED ORDER — LIDOCAINE HCL (CARDIAC) 20 MG/ML IV SOLN
INTRAVENOUS | Status: DC | PRN
  Administered 2014-08-12: 80 mg via INTRAVENOUS

## 2014-08-12 MED ORDER — DEXAMETHASONE SODIUM PHOSPHATE 4 MG/ML IJ SOLN
INTRAMUSCULAR | Status: AC
  Filled 2014-08-12: qty 1

## 2014-08-12 MED ORDER — LACTATED RINGERS IR SOLN
Status: DC | PRN
  Administered 2014-08-12: 1

## 2014-08-12 MED ORDER — ROCURONIUM BROMIDE 100 MG/10ML IV SOLN
INTRAVENOUS | Status: AC
  Filled 2014-08-12: qty 1

## 2014-08-12 MED ORDER — LIDOCAINE HCL (CARDIAC) 20 MG/ML IV SOLN
INTRAVENOUS | Status: AC
  Filled 2014-08-12: qty 5

## 2014-08-12 MED ORDER — SODIUM CHLORIDE 0.9 % IJ SOLN
INTRAMUSCULAR | Status: AC
  Filled 2014-08-12: qty 50

## 2014-08-12 MED ORDER — FENTANYL CITRATE 0.05 MG/ML IJ SOLN
INTRAMUSCULAR | Status: DC | PRN
  Administered 2014-08-12 (×7): 50 ug via INTRAVENOUS

## 2014-08-12 MED ORDER — CEFAZOLIN SODIUM-DEXTROSE 2-3 GM-% IV SOLR
INTRAVENOUS | Status: AC
  Filled 2014-08-12: qty 50

## 2014-08-12 MED ORDER — PNEUMOCOCCAL VAC POLYVALENT 25 MCG/0.5ML IJ INJ
0.5000 mL | INJECTION | INTRAMUSCULAR | Status: AC
  Administered 2014-08-13: 0.5 mL via INTRAMUSCULAR
  Filled 2014-08-12: qty 0.5

## 2014-08-12 MED ORDER — HYDROMORPHONE HCL 1 MG/ML IJ SOLN
0.2000 mg | INTRAMUSCULAR | Status: DC | PRN
  Administered 2014-08-12 (×2): 0.6 mg via INTRAVENOUS
  Filled 2014-08-12 (×2): qty 1

## 2014-08-12 MED ORDER — PROPOFOL 10 MG/ML IV BOLUS
INTRAVENOUS | Status: AC
  Filled 2014-08-12: qty 20

## 2014-08-12 MED ORDER — NEOSTIGMINE METHYLSULFATE 10 MG/10ML IV SOLN
INTRAVENOUS | Status: DC | PRN
  Administered 2014-08-12: 2 mg via INTRAVENOUS

## 2014-08-12 MED ORDER — ONDANSETRON HCL 4 MG/2ML IJ SOLN
4.0000 mg | Freq: Four times a day (QID) | INTRAMUSCULAR | Status: DC | PRN
Start: 2014-08-12 — End: 2014-08-13

## 2014-08-12 MED ORDER — FENTANYL CITRATE 0.05 MG/ML IJ SOLN
INTRAMUSCULAR | Status: AC
  Filled 2014-08-12: qty 2

## 2014-08-12 MED ORDER — FENTANYL CITRATE 0.05 MG/ML IJ SOLN
25.0000 ug | INTRAMUSCULAR | Status: DC | PRN
  Administered 2014-08-12 (×2): 25 ug via INTRAVENOUS
  Administered 2014-08-12 (×2): 50 ug via INTRAVENOUS

## 2014-08-12 MED ORDER — OXYCODONE-ACETAMINOPHEN 5-325 MG PO TABS
1.0000 | ORAL_TABLET | ORAL | Status: DC | PRN
  Administered 2014-08-12 – 2014-08-13 (×4): 2 via ORAL
  Filled 2014-08-12 (×4): qty 2

## 2014-08-12 MED ORDER — ONDANSETRON HCL 4 MG PO TABS: 4.0000 mg | ORAL_TABLET | Freq: Four times a day (QID) | ORAL | Status: DC | PRN

## 2014-08-12 MED ORDER — IBUPROFEN 800 MG PO TABS: 800.0000 mg | ORAL_TABLET | Freq: Three times a day (TID) | ORAL | Status: DC | PRN

## 2014-08-12 MED ORDER — ALPRAZOLAM 0.5 MG PO TABS
0.5000 mg | ORAL_TABLET | Freq: Three times a day (TID) | ORAL | Status: DC | PRN
  Administered 2014-08-12 (×2): 0.5 mg via ORAL
  Filled 2014-08-12 (×2): qty 1

## 2014-08-12 MED ORDER — ONDANSETRON HCL 4 MG/2ML IJ SOLN
INTRAMUSCULAR | Status: DC | PRN
  Administered 2014-08-12: 4 mg via INTRAVENOUS

## 2014-08-12 MED ORDER — ONDANSETRON HCL 4 MG/2ML IJ SOLN
INTRAMUSCULAR | Status: AC
  Filled 2014-08-12: qty 2

## 2014-08-12 MED ORDER — MIDAZOLAM HCL 2 MG/2ML IJ SOLN
INTRAMUSCULAR | Status: DC | PRN
  Administered 2014-08-12: 2 mg via INTRAVENOUS

## 2014-08-12 MED ORDER — SCOPOLAMINE 1 MG/3DAYS TD PT72
1.0000 | MEDICATED_PATCH | Freq: Once | TRANSDERMAL | Status: DC
  Administered 2014-08-12: 1.5 mg via TRANSDERMAL

## 2014-08-12 MED ORDER — LACTATED RINGERS IV SOLN
INTRAVENOUS | Status: DC
  Administered 2014-08-12: 07:00:00 via INTRAVENOUS
  Administered 2014-08-12: 125 mL/h via INTRAVENOUS
  Administered 2014-08-12 (×2): via INTRAVENOUS

## 2014-08-12 MED ORDER — GLYCOPYRROLATE 0.2 MG/ML IJ SOLN
INTRAMUSCULAR | Status: AC
  Filled 2014-08-12: qty 3

## 2014-08-12 MED ORDER — PROMETHAZINE HCL 25 MG/ML IJ SOLN: 6.2500 mg | INTRAMUSCULAR | Status: DC | PRN

## 2014-08-12 MED ORDER — NEOSTIGMINE METHYLSULFATE 10 MG/10ML IV SOLN
INTRAVENOUS | Status: AC
  Filled 2014-08-12: qty 1

## 2014-08-12 SURGICAL SUPPLY — 45 items
APPLICATOR COTTON TIP 6IN STRL (MISCELLANEOUS) ×4 IMPLANT
CANISTER SUCT 3000ML (MISCELLANEOUS) ×4 IMPLANT
CATH ROBINSON RED A/P 16FR (CATHETERS) ×4 IMPLANT
CLOSURE WOUND 1/2 X4 (GAUZE/BANDAGES/DRESSINGS) ×1
CLOTH BEACON ORANGE TIMEOUT ST (SAFETY) ×4 IMPLANT
CONT PATH 16OZ SNAP LID 3702 (MISCELLANEOUS) ×4 IMPLANT
DECANTER SPIKE VIAL GLASS SM (MISCELLANEOUS) ×8 IMPLANT
DRSG COVADERM PLUS 2X2 (GAUZE/BANDAGES/DRESSINGS) ×6 IMPLANT
DRSG OPSITE POSTOP 3X4 (GAUZE/BANDAGES/DRESSINGS) ×4 IMPLANT
DURAPREP 26ML APPLICATOR (WOUND CARE) ×4 IMPLANT
ELECT REM PT RETURN 9FT ADLT (ELECTROSURGICAL) ×4
ELECTRODE REM PT RTRN 9FT ADLT (ELECTROSURGICAL) IMPLANT
GLOVE BIO SURGEON STRL SZ 6.5 (GLOVE) ×6 IMPLANT
GLOVE BIO SURGEONS STRL SZ 6.5 (GLOVE) ×2
GLOVE BIOGEL PI IND STRL 7.0 (GLOVE) ×4 IMPLANT
GLOVE BIOGEL PI INDICATOR 7.0 (GLOVE) ×4
GOWN STRL REUS W/TWL LRG LVL3 (GOWN DISPOSABLE) ×8 IMPLANT
NDL MAYO 6 CRC TAPER PT (NEEDLE) IMPLANT
NDL SAFETY ECLIPSE 18X1.5 (NEEDLE) ×2 IMPLANT
NDL SPNL 18GX3.5 QUINCKE PK (NEEDLE) ×2 IMPLANT
NEEDLE HYPO 18GX1.5 SHARP (NEEDLE) ×4
NEEDLE INSUFFLATION 120MM (ENDOMECHANICALS) ×4 IMPLANT
NEEDLE MAYO 6 CRC TAPER PT (NEEDLE) ×4 IMPLANT
NEEDLE SPNL 18GX3.5 QUINCKE PK (NEEDLE) ×4 IMPLANT
NS IRRIG 1000ML POUR BTL (IV SOLUTION) ×4 IMPLANT
PACK LAPAROSCOPY BASIN (CUSTOM PROCEDURE TRAY) ×4 IMPLANT
PACK LAVH (CUSTOM PROCEDURE TRAY) ×4 IMPLANT
PACK ROBOTIC GOWN (GOWN DISPOSABLE) ×4 IMPLANT
PAD POSITIONER PINK NONSTERILE (MISCELLANEOUS) ×4 IMPLANT
PROTECTOR NERVE ULNAR (MISCELLANEOUS) ×4 IMPLANT
SHEARS HARMONIC ACE PLUS 36CM (ENDOMECHANICALS) ×4 IMPLANT
SLEEVE XCEL OPT CAN 5 100 (ENDOMECHANICALS) ×4 IMPLANT
STRIP CLOSURE SKIN 1/2X4 (GAUZE/BANDAGES/DRESSINGS) ×3 IMPLANT
SUT VIC AB 0 CT1 36 (SUTURE) ×4 IMPLANT
SUT VIC AB 2-0 CT1 18 (SUTURE) ×9 IMPLANT
SUT VICRYL 0 UR6 27IN ABS (SUTURE) ×8 IMPLANT
SUT VICRYL 4-0 PS2 18IN ABS (SUTURE) ×8 IMPLANT
SYR 30ML LL (SYRINGE) ×4 IMPLANT
SYR BULB IRRIGATION 50ML (SYRINGE) ×4 IMPLANT
TOWEL OR 17X24 6PK STRL BLUE (TOWEL DISPOSABLE) ×12 IMPLANT
TRAY FOLEY CATH 14FR (SET/KITS/TRAYS/PACK) ×4 IMPLANT
TROCAR OPTI TIP 5M 100M (ENDOMECHANICALS) ×4 IMPLANT
TROCAR XCEL DIL TIP R 11M (ENDOMECHANICALS) ×4 IMPLANT
WARMER LAPAROSCOPE (MISCELLANEOUS) ×4 IMPLANT
WATER STERILE IRR 1000ML POUR (IV SOLUTION) ×4 IMPLANT

## 2014-08-12 NOTE — Progress Notes (Signed)
Pt. Called rn back to room and wants interperter canceled interperter as pt. Directed rn

## 2014-08-12 NOTE — Progress Notes (Signed)
Pt. Unable to vd bladder scan less than 200 cc pt. Enc. To inc. Po fluids pt. Writes alittle uncomfortable will cont to assess and encouage fluids

## 2014-08-12 NOTE — Anesthesia Preprocedure Evaluation (Signed)
Anesthesia Evaluation  Patient identified by MRN, date of birth, ID band Patient awake    Reviewed: Allergy & Precautions, H&P , NPO status , Patient's Chart, lab work & pertinent test results  Airway Mallampati: II  TM Distance: >3 FB Neck ROM: full    Dental no notable dental hx. (+) Teeth Intact   Pulmonary Current Smoker,    Pulmonary exam normal       Cardiovascular negative cardio ROS      Neuro/Psych negative neurological ROS  negative psych ROS   GI/Hepatic negative GI ROS, Neg liver ROS,   Endo/Other    Renal/GU negative Renal ROS     Musculoskeletal   Abdominal Normal abdominal exam  (+)   Peds  Hematology negative hematology ROS (+)   Anesthesia Other Findings   Reproductive/Obstetrics negative OB ROS                             Anesthesia Physical Anesthesia Plan  ASA: II  Anesthesia Plan: General   Post-op Pain Management:    Induction: Intravenous  Airway Management Planned: Oral ETT  Additional Equipment:   Intra-op Plan:   Post-operative Plan: Extubation in OR  Informed Consent: I have reviewed the patients History and Physical, chart, labs and discussed the procedure including the risks, benefits and alternatives for the proposed anesthesia with the patient or authorized representative who has indicated his/her understanding and acceptance.   Dental Advisory Given  Plan Discussed with: CRNA, Surgeon and Anesthesiologist  Anesthesia Plan Comments:         Anesthesia Quick Evaluation

## 2014-08-12 NOTE — Progress Notes (Signed)
Sign Language interpreter, Juliann Pulse, present at bedside.

## 2014-08-12 NOTE — Anesthesia Postprocedure Evaluation (Signed)
Anesthesia Post Note  Patient: Latasha Parker  Procedure(s) Performed: Procedure(s) (LRB): LAPAROSCOPIC ASSISTED VAGINAL HYSTERECTOMY (N/A) REMOVE RIGHT SALPINGO OOPHORECTOMY (Right)  Anesthesia type: GA  Patient location: PACU  Post pain: Pain level controlled  Post assessment: Post-op Vital signs reviewed  Last Vitals:  Filed Vitals:   08/12/14 0617  BP: 109/55  Pulse: 81  Temp: 36.7 C  Resp: 20    Post vital signs: Reviewed  Level of consciousness: sedated  Complications: No apparent anesthesia complications

## 2014-08-12 NOTE — Anesthesia Procedure Notes (Signed)
Procedure Name: Intubation Date/Time: 08/12/2014 7:50 AM Performed by: Brock Ra Pre-anesthesia Checklist: Patient identified, Emergency Drugs available, Suction available, Patient being monitored and Timeout performed Patient Re-evaluated:Patient Re-evaluated prior to inductionOxygen Delivery Method: Circle system utilized Preoxygenation: Pre-oxygenation with 100% oxygen Intubation Type: IV induction Ventilation: Mask ventilation without difficulty Laryngoscope Size: Mac and 3 Grade View: Grade I Tube type: Oral Tube size: 7.0 mm Number of attempts: 1 Airway Equipment and Method: Stylet Placement Confirmation: ETT inserted through vocal cords under direct vision,  positive ETCO2 and breath sounds checked- equal and bilateral Secured at: 20 (at lips) cm Tube secured with: Tape

## 2014-08-12 NOTE — Addendum Note (Signed)
Addendum  created 08/12/14 1730 by Asher Muir, CRNA   Modules edited: Notes Section   Notes Section:  File: [id number]

## 2014-08-12 NOTE — Addendum Note (Signed)
Addendum  created 08/12/14 1042 by Brock Ra, CRNA   Modules edited: Anesthesia Events, Charges VN, Narrator   Narrator:  Narrator: Event Log Edited

## 2014-08-12 NOTE — OR Nursing (Signed)
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 Pt. Understood all questions and procedures I went over with her. CFNewnam,RN

## 2014-08-12 NOTE — H&P (Signed)
Latasha Parker is a 38 yo SWP2 here today to have a LAVH/RSO due to chronic pelvic pain, bilateral since 2003. She saw Dr. Ihor Dow in June who recommended a Mirena, but she did not come back because she does not want that. She has pain with sex, avoids it sometimes due to the pain. It occurs with walking all day, MUCH worse with her period. She and her mother are adament that she would like her uterus.   Review of Systems U/S normal today except for a stable right dermoid. The left ovary has been removed.    No LMP recorded.    Past Medical History  Diagnosis Date  . Deaf   . SVD (spontaneous vaginal delivery)   . Hyperthyroidism   . Menorrhagia   . Pelvic pain   . Asthma   . Arthritis   . Cyst of ovary     right    Past Surgical History  Procedure Laterality Date  . Ovary tumor      cyst  . Foot surgery    . Diagnostic laparoscopy  2003    ovarian cyst removed  . Wisdom tooth extraction    . Laparoscopy N/A 07/17/2012    Procedure: LAPAROSCOPY OPERATIVE;  Surgeon: Mora Bellman, MD;  Location: [facility];  Service: Gynecology;  Laterality: N/A;  left oopherectomy and salpingectomy  . Multiple extractions with alveoloplasty N/A 06/06/2014    Procedure: MULTIPLE EXTRACTIONS WITH ALVEOLOPLASTY/REMOVAL OF TORI;  Surgeon: Gae Bon, DDS;  Location: [facility];  Service: Oral Surgery;  Laterality: N/A;    Family History  Problem Relation Age of Onset  . Cancer Maternal Grandmother   . Cancer Mother   . Cancer Father     Social History:  reports that she has been smoking Cigarettes.  She has a 7.5 pack-year smoking history. She has never used smokeless tobacco. She reports that she does not drink alcohol or use illicit drugs.  Allergies:  Allergies  Allergen Reactions  . Tramadol Nausea And Vomiting  . Vicodin [Hydrocodone-Acetaminophen] Nausea And Vomiting    Prescriptions prior to admission  Medication Sig Dispense Refill Last Dose  . ALPRAZolam (XANAX) 0.5 MG  tablet Take 1 tablet (0.5 mg total) by mouth 3 (three) times daily as needed for anxiety. 270 tablet 0 08/11/2014 at 2100  . beclomethasone (QVAR) 80 MCG/ACT inhaler Inhale 2 puffs into the lungs 2 (two) times daily. 1 Inhaler 6 Taking  . buPROPion (ZYBAN) 150 MG 12 hr tablet One pill daily for 3 days, then one pill twice per day 60 tablet 3 Taking  . PROAIR HFA 108 (90 BASE) MCG/ACT inhaler INHALE 2 PUFFS INTO THE LUNGS EVERY 6 (SIX) HOURS AS NEEDED FOR WHEEZING OR SHORTNESS OF BREATH. 8.5 Inhaler 1     ROS  Blood pressure 109/55, pulse 81, temperature 98.1 F (36.7 C), temperature source Oral, resp. rate 20, SpO2 98 %. Physical Exam  Heart- rrr Lungs- CTAB Abd- benign  Results for orders placed or performed during the hospital encounter of 08/12/14 (from the past 24 hour(s))  Pregnancy, urine     Status: None   Collection Time: 08/12/14  6:00 AM  Result Value Ref Range   Preg Test, Ur NEGATIVE NEGATIVE    No results found.  Assessment/Plan: Chronic pelvic pain, right dermoid. She would like to have her uterus and right ovary/tube removed. She is aware that she will be postmenopausal.  She understands the risks of surgery, including, but not to infection, bleeding,  DVTs, damage to bowel, bladder, ureters. She wishes to proceed.     Latasha Parker C. 08/12/2014, 7:04 AM

## 2014-08-12 NOTE — Op Note (Signed)
08/12/2014  10:15 AM  PATIENT:  Latasha Parker  39 y.o. female  PRE-OPERATIVE DIAGNOSIS:  Right Dermoid Cyst with Chronic Pelvic Pain  POST-OPERATIVE DIAGNOSIS:  same  PROCEDURE:  Procedure(s): LAPAROSCOPIC ASSISTED VAGINAL HYSTERECTOMY (N/A) REMOVE RIGHT SALPINGO OOPHORECTOMY (Right)  SURGEON:  Surgeon(s) and Role:    * Emily Filbert, MD - Primary    * Lavonia Drafts, MD - Assisting   ANESTHESIA:   general  EBL:  Total I/O In: 2000 [I.V.:2000] Out: 1125 [Urine:925; Blood:200]  BLOOD ADMINISTERED:none  DRAINS: none   LOCAL MEDICATIONS USED:  MARCAINE     SPECIMEN:  Source of Specimen:  uterus, right tube and ovary  DISPOSITION OF SPECIMEN:  PATHOLOGY  COUNTS:  YES  TOURNIQUET:  * No tourniquets in log *  DICTATION: .Dragon Dictation  PLAN OF CARE: Admit for overnight observation  PATIENT DISPOSITION:  PACU - hemodynamically stable.   Delay start of Pharmacological VTE agent (>24hrs) due to surgical blood loss or risk of bleeding: not applicable   The risks, benefits, and alternatives of surgery were explained, accepted, and understood. Consents were signed. She was taken to the operating room and placed in the dorsal lithotomy position. When she was comfortable, general anesthesia was applied without complication. Her abdomen and vagina were prepped and draped in the usual sterile fashion. A bimanual exam revealed a small anteverted uterus, her adnexa were not palpable. A Hulka manipulator was placed. A Foley catheter was placed and a drained clear urine throughout the case. Gloves were changed and attention was turned to the abdomen. 0.5% Marcaine was used to inject at all the incision sites prior to making an incision. A 10 mm incision was made in the umbilicus. A varies needle was placed intraperitoneally. Low-flow CO2 was used to insufflate the abdomen to approximately 3 L. After an excellent pneumoperitoneum was established a 10 mm trocar was placed.  Laparoscopy confirmed correct placement. Under direct laparoscopic visualization I placed a 5 mm port in each lower quadrant. She was placed in the Trendelenburg position.  Her uterus was noted to be normal.  We used a harmonic scalpel to ligate the uteroovarian ligaments on each side. The round ligaments were ligated with the harmonic scalpel as well. I then moved to the vaginal approach. I used a solution of 30cc of 0.5% marcaine with 100 cc of normal saline to hydrodisect at the cervicovaginal junction. I made a circumferential incision at the site. The posterior peritoneum was entered sharply and a long weighted speculum was placed. I ligated, tagged, and held the uterosacral ligaments. 2-0 vicryl suture is used throughout unless otherwise specified.  I entered the anterior peritoneum as well. I then continued to separate the uterus from its pelvic attachments. The uterus, right tube, and ovary were removed en bloc. Excellent hemostasis was maintained throughout. I used the tags from the uterosacral ligaments to provide angle sutures for vaginal cuff support. I closed the vaginal cuff in a vertical fashion with a 0 vicryl suture in a running, locking fashion. Gloves were changed and attention was turned back to the abdomen. The pelvis was inspected and noted to be hemostatic. The CO2 was allowed to escape from the abdomen. The fascia at the umbilicus was closed with a 0 Vicryl figure-of-eight suture. No defects were palpable. A subcuticular closure was done with 4-0 Vicryl at all of the skin incisions. Steri-Strips were placed across the incision. She was extubated and taken to the recovery room in stable condition.

## 2014-08-12 NOTE — Progress Notes (Signed)
Pt complaining that foley catheter causing a lot of discomfort... Dr. Hulan Fray notified, and stated to remove foley catheter and NSL IV. Foley removed, and IV converted to NSL.

## 2014-08-12 NOTE — Transfer of Care (Signed)
Immediate Anesthesia Transfer of Care Note  Patient: Latasha Parker  Procedure(s) Performed: Procedure(s): LAPAROSCOPIC ASSISTED VAGINAL HYSTERECTOMY (N/A) REMOVE RIGHT SALPINGO OOPHORECTOMY (Right)  Patient Location: PACU  Anesthesia Type:General  Level of Consciousness: awake, alert , oriented and patient cooperative  Airway & Oxygen Therapy: Patient Spontanous Breathing and Patient connected to nasal cannula oxygen  Post-op Assessment: Report given to RN and Post -op Vital signs reviewed and stable  Post vital signs: Reviewed and stable  Last Vitals:  Filed Vitals:   08/12/14 0617  BP: 109/55  Pulse: 81  Temp: 36.7 C  Resp: 20    Complications: No apparent anesthesia complications

## 2014-08-12 NOTE — Anesthesia Postprocedure Evaluation (Signed)
Anesthesia Post Note  Patient: Latasha Parker  Procedure(s) Performed: Procedure(s) (LRB): LAPAROSCOPIC ASSISTED VAGINAL HYSTERECTOMY (N/A) REMOVE RIGHT SALPINGO OOPHORECTOMY (Right)  Anesthesia type: General  Patient location: [facility]  Post pain: Pain level controlled  Post assessment: Post-op Vital signs reviewed  Last Vitals:  Filed Vitals:   08/12/14 1703  BP: 110/70  Pulse: 90  Temp: 36.8 C  Resp: 14    Post vital signs: Reviewed  Level of consciousness: sedated  Complications: No apparent anesthesia complications

## 2014-08-13 DIAGNOSIS — R102 Pelvic and perineal pain: Secondary | ICD-10-CM | POA: Diagnosis not present

## 2014-08-13 LAB — CBC
HEMATOCRIT: 27.7 % — AB (ref 36.0–46.0)
HEMOGLOBIN: 9.5 g/dL — AB (ref 12.0–15.0)
MCH: 32 pg (ref 26.0–34.0)
MCHC: 34.3 g/dL (ref 30.0–36.0)
MCV: 93.3 fL (ref 78.0–100.0)
Platelets: 431 10*3/uL — ABNORMAL HIGH (ref 150–400)
RBC: 2.97 MIL/uL — ABNORMAL LOW (ref 3.87–5.11)
RDW: 16.1 % — AB (ref 11.5–15.5)
WBC: 16.7 10*3/uL — ABNORMAL HIGH (ref 4.0–10.5)

## 2014-08-13 MED ORDER — IBUPROFEN 800 MG PO TABS: 800.0000 mg | ORAL_TABLET | Freq: Three times a day (TID) | ORAL | Status: DC | PRN

## 2014-08-13 MED ORDER — ESTRADIOL 0.1 MG/24HR TD PTWK: 0.1000 mg | MEDICATED_PATCH | TRANSDERMAL | Status: DC

## 2014-08-13 MED ORDER — OXYCODONE-ACETAMINOPHEN 5-325 MG PO TABS: 1.0000 | ORAL_TABLET | ORAL | Status: DC | PRN

## 2014-08-13 NOTE — Progress Notes (Signed)
Out in wheelchair   Teaching complete  

## 2014-08-13 NOTE — Discharge Summary (Signed)
Physician Discharge Summary  Patient ID: Latasha Parker MRN: [id number] DOB/AGE: 1975-12-07 39 y.o.  Admit date: 08/12/2014 Discharge date: 08/13/2014  Admission Diagnoses: chronic pelvic pain, right dermoid  Discharge Diagnoses: same Active Problems:   Post-operative state   Discharged Condition: good  Hospital Course: She underwent an uncomplicated LAVH/RSO. By POD #0 she was ambulating and voiding. She was eating without nausea or vomitting. By POD #1 she was feeling ready to go home.  Consults: None  Significant Diagnostic Studies: labs: post op hemoglobin was 9.5.  Treatments: surgery: as above  Discharge Exam: Blood pressure 109/69, pulse 90, temperature 99.1 F (37.3 C), temperature source Oral, resp. rate 16, height 5\' 8"  (1.727 m), weight 74.844 kg (165 lb), SpO2 92 %. General appearance: alert Resp: clear to auscultation bilaterally Cardio: regular rate and rhythm, S1, S2 normal, no murmur, click, rub or gallop GI: soft, non-tender; bowel sounds normal; no masses,  no organomegaly Incision/Wound:dressings c/d/i  Disposition: 01-Home or Self Care     Medication List    TAKE these medications        ALPRAZolam 0.5 MG tablet  Commonly known as:  XANAX  Take 1 tablet (0.5 mg total) by mouth 3 (three) times daily as needed for anxiety.     beclomethasone 80 MCG/ACT inhaler  Commonly known as:  QVAR  Inhale 2 puffs into the lungs 2 (two) times daily.     buPROPion 150 MG 12 hr tablet  Commonly known as:  ZYBAN  One pill daily for 3 days, then one pill twice per day     estradiol 0.1 mg/24hr patch  Commonly known as:  CLIMARA - Dosed in mg/24 hr  Place 1 patch (0.1 mg total) onto the skin once a week.     ibuprofen 800 MG tablet  Commonly known as:  ADVIL,MOTRIN  Take 1 tablet (800 mg total) by mouth every 8 (eight) hours as needed (mild pain).     oxyCODONE-acetaminophen 5-325 MG per tablet  Commonly known as:  PERCOCET/ROXICET  Take 1-2 tablets by  mouth every 4 (four) hours as needed for severe pain (moderate to severe pain (when tolerating fluids)).     PROAIR HFA 108 (90 BASE) MCG/ACT inhaler  Generic drug:  albuterol  INHALE 2 PUFFS INTO THE LUNGS EVERY 6 (SIX) HOURS AS NEEDED FOR WHEEZING OR SHORTNESS OF BREATH.           Follow-up Information    Follow up with Waverly Chavarria C., MD. Schedule an appointment as soon as possible for a visit in 6 weeks.   Specialty:  Obstetrics and Gynecology   Contact information:   [place] [address] [phone]       Signed: Emily Filbert 08/13/2014, 12:45 PM

## 2014-08-13 NOTE — Discharge Instructions (Signed)
Abdominal Hysterectomy, Care After Refer to this sheet in the next few weeks. These instructions provide you with information on caring for yourself after your procedure. Your health care provider may also give you more specific instructions. Your treatment has been planned according to current medical practices, but problems sometimes occur. Call your health care provider if you have any problems or questions after your procedure.  WHAT TO EXPECT AFTER THE PROCEDURE After your procedure, it is typical to have the following:  Pain.  Feeling tired.  Poor appetite.  Less interest in sex. HOME CARE INSTRUCTIONS  It takes 4-6 weeks to recover from this surgery. Make sure you follow all your health care provider's instructions. Home care instructions may include:  Take pain medicines only as directed by your health care provider. Do not take over-the-counter pain medicines without checking with your health care provider first.  Change your bandage as directed by your health care provider.  Return to your health care provider to have your sutures taken out.  Take showers instead of baths for 2-3 weeks. Ask your health care provider when it is safe to start showering.  Do not douche, use tampons, or have sexual intercourse for at least 6 weeks or until your health care provider says you can.   Follow your health care provider's advice about exercise, lifting, driving, and general activities.  Get plenty of rest and sleep.   Do not lift anything heavier than a gallon of milk (about 10 lb [4.5 kg]) for the first month after surgery.  You can resume your normal diet if your health care provider says it is okay.   Do not drink alcohol until your health care provider says you can.   If you are constipated, ask your health care provider if you can take a mild laxative.  Eating foods high in fiber may also help with constipation. Eat plenty of raw fruits and vegetables, whole grains, and  beans.  Drink enough fluids to keep your urine clear or pale yellow.   Try to have someone at home with you for the first 1-2 weeks to help around the house.  Keep all follow-up appointments. SEEK MEDICAL CARE IF:   You have chills or fever.  You have swelling, redness, or pain in the area of your incision that is getting worse.   You have pus coming from the incision.   You notice a bad smell coming from the incision or bandage.   Your incision breaks open.   You feel dizzy or light-headed.   You have pain or bleeding when you urinate.   You have persistent diarrhea.   You have persistent nausea and vomiting.   You have abnormal vaginal discharge.   You have a rash.   You have any type of abnormal reaction or develop an allergy to your medicine.   Your pain medicine is not helping.  SEEK IMMEDIATE MEDICAL CARE IF:   You have a fever and your symptoms suddenly get worse.  You have severe abdominal pain.  You have chest pain.  You have shortness of breath.  You faint.  You have pain, swelling, or redness of your leg.  You have heavy vaginal bleeding with blood clots. MAKE SURE YOU:  Understand these instructions.  Will watch your condition.  Will get help right away if you are not doing well or get worse. Document Released: 11/26/2004 Document Revised: 05/14/2013 Document Reviewed: 03/01/2013 [facility] Patient Information 2015 [place], [place]. This information is not intended  to replace advice given to you by your health care provider. Make sure you discuss any questions you have with your health care provider. Histerectoma abdominal (Abdominal Hysterectomy) La histerectoma abdominal es un procedimiento quirrgico en el que se extirpa el tero. El tero es el rgano muscular donde se desarrolla el feto. Esta ciruga puede hacerse por muchos motivos. Puede necesitar una histerectoma abdominal si tiene cncer, tumores, dolor a largo plazo o  hemorragia. Tambin pueden hacerle este procedimiento si el tero ha descendido hacia la vagina (prolapso uterino). Segn las causas por las que necesite una histerectoma abdominal, es posible que tambin le extirpen otros rganos del aparato reproductor. Estos podran incluir la parte de la vagina que se conecta con el tero (cuello del tero), los rganos que producen vulos (ovarios) y las trompas que [employer] ovarios con el tero (trompas de [place]). INFORME AL MDICO:   Cualquier alergia que tenga.  Todos los [employer], incluidos vitaminas, hierbas, gotas oftlmicas, cremas y medicamentos de venta libre.  Problemas previos que usted o los [employer] de su familia hayan tenido con el uso de anestsicos.  Enfermedades de la sangre que tenga.  Cirugas previas.  Enfermedades que tenga. RIESGOS Y COMPLICACIONES En general, se trata de un procedimiento seguro. Sin embargo, Engineer, technical sales, pueden surgir problemas. La infeccin es el problema ms frecuente despus de una histerectoma abdominal. Otros problemas posibles incluyen:  Hemorragias.  Formacin de cogulos sanguneos que pueden desprenderse y Sports administrator a los pulmones.  Lesin en otros rganos cercanos al tero.  Lesin en los nervios que causa neuralgia.  Menor inters sexual o [place]. ANTES DEL [place] abdominal es un procedimiento quirrgico mayor. Puede afectar la percepcin que tiene de usted Selt[place]. Hable con el mdico [employer] fsicos y emocionales que puede causar la histerectoma.  Quiz deban hacerle anlisis de [place] y radiografas antes de la [place].  Si fuma, deje de hacerlo. Pdale ayuda al mdico si est teniendo inconvenientes para dejar de fumar.  Deje de tomar medicamentos anticoagulantes segn las indicaciones del mdico.  Pueden indicarle que tome antibiticos o laxantes antes de la [place].  No coma ni  beba nada durante las 6 a 8 horas previas a la [place].  Tome sus medicamentos habituales con un sorbito de [place].  Tome un bao de inmersin o una ducha la noche o la maana anterior al procedimiento. PROCEDIMIENTO  La histerectoma abdominal se hace en el quirfano del hospital.  En la mayora de los [place], se le administrar un medicamento que la har dormir (anestesia general).  El cirujano har un corte (incisin) a travs de la piel en la parte inferior del abdomen.  La incisin puede tener de 5a 7pulgadas de [place]. Puede ser horizontal o vertical.  El cirujano apartar el tejido que recubre al tero. Luego, extraer con cuidado el tero junto con cualquier otro rgano del aparato reproductor que Media planner.  La hemorragia se controlar con pinzas o suturas.  El cirujano cerrar la incisin con suturas o clips metlicos. DESPUS DEL PROCEDIMIENTO  Sentir algo de dolor inmediatamente despus del procedimiento.  Le administrarn medicamentos para calmar el dolor cuando est en el rea de recuperacin.  La llevarn a la habitacin del hospital cuando se haya recuperado de la anestesia.  Es posible que [facility] hospital durante 2a 5das.  Recibir instrucciones para recuperarse en su casa. Document Released: 05/14/2013 [facility] Patient Information 2015 [place]. This information  is not intended to replace advice given to you by your health care provider. Make sure you discuss any questions you have with your health care provider.

## 2014-08-15 ENCOUNTER — Encounter (HOSPITAL_COMMUNITY): Payer: Self-pay | Admitting: Obstetrics & Gynecology

## 2014-08-19 ENCOUNTER — Inpatient Hospital Stay (HOSPITAL_COMMUNITY)
Admission: AD | Admit: 2014-08-19 | Discharge: 2014-08-19 | Disposition: A | Discharge status: Home / Self Care | Payer: Medicaid Other | Source: Ambulatory Visit | Attending: Family Medicine | Admitting: Family Medicine

## 2014-08-19 ENCOUNTER — Encounter (HOSPITAL_COMMUNITY): Payer: Self-pay | Admitting: Emergency Medicine

## 2014-08-19 DIAGNOSIS — R1030 Lower abdominal pain, unspecified: Secondary | ICD-10-CM | POA: Diagnosis present

## 2014-08-19 DIAGNOSIS — S301XXA Contusion of abdominal wall, initial encounter: Secondary | ICD-10-CM | POA: Diagnosis not present

## 2014-08-19 DIAGNOSIS — G8918 Other acute postprocedural pain: Secondary | ICD-10-CM

## 2014-08-19 DIAGNOSIS — H9190 Unspecified hearing loss, unspecified ear: Secondary | ICD-10-CM | POA: Diagnosis not present

## 2014-08-19 DIAGNOSIS — F17210 Nicotine dependence, cigarettes, uncomplicated: Secondary | ICD-10-CM | POA: Diagnosis not present

## 2014-08-19 LAB — CBC WITH DIFFERENTIAL/PLATELET
Basophils Absolute: 0 10*3/uL (ref 0.0–0.1)
Basophils Relative: 0 % (ref 0–1)
EOS ABS: 0.9 10*3/uL — AB (ref 0.0–0.7)
EOS PCT: 8 % — AB (ref 0–5)
HEMATOCRIT: 23.7 % — AB (ref 36.0–46.0)
HEMOGLOBIN: 7.8 g/dL — AB (ref 12.0–15.0)
Lymphocytes Relative: 18 % (ref 12–46)
Lymphs Abs: 2 10*3/uL (ref 0.7–4.0)
MCH: 32.1 pg (ref 26.0–34.0)
MCHC: 32.9 g/dL (ref 30.0–36.0)
MCV: 97.5 fL (ref 78.0–100.0)
MONO ABS: 1.1 10*3/uL — AB (ref 0.1–1.0)
MONOS PCT: 10 % (ref 3–12)
NEUTROS ABS: 7.2 10*3/uL (ref 1.7–7.7)
Neutrophils Relative %: 64 % (ref 43–77)
Platelets: 482 10*3/uL — ABNORMAL HIGH (ref 150–400)
RBC: 2.43 MIL/uL — ABNORMAL LOW (ref 3.87–5.11)
RDW: 17.6 % — ABNORMAL HIGH (ref 11.5–15.5)
WBC: 11.2 10*3/uL — ABNORMAL HIGH (ref 4.0–10.5)

## 2014-08-19 LAB — URINALYSIS, ROUTINE W REFLEX MICROSCOPIC
GLUCOSE, UA: NEGATIVE mg/dL
Ketones, ur: NEGATIVE mg/dL
Nitrite: NEGATIVE
Protein, ur: 30 mg/dL — AB
Specific Gravity, Urine: 1.02 (ref 1.005–1.030)
UROBILINOGEN UA: 4 mg/dL — AB (ref 0.0–1.0)
pH: 6.5 (ref 5.0–8.0)

## 2014-08-19 LAB — URINE MICROSCOPIC-ADD ON

## 2014-08-19 MED ORDER — OXYCODONE-ACETAMINOPHEN 5-325 MG PO TABS
1.0000 | ORAL_TABLET | Freq: Once | ORAL | Status: AC
  Administered 2014-08-19: 1 via ORAL
  Filled 2014-08-19: qty 1

## 2014-08-19 MED ORDER — OXYCODONE-ACETAMINOPHEN 5-325 MG PO TABS
1.0000 | ORAL_TABLET | Freq: Four times a day (QID) | ORAL | Status: DC | PRN
Start: 2014-08-19 — End: 2015-03-16

## 2014-08-19 NOTE — MAU Note (Signed)
Laparoscopic Hysterectomy on 3/22. Goes to [facility] clinic. 1 to 2 days post-op pt. Noted bruising of lower abdomen. Upon exam pt is bruised on entire lower abdomen and suprapubic area. Pt. Rates pain 7/10 worse with movement.

## 2014-08-19 NOTE — Discharge Instructions (Signed)

## 2014-08-19 NOTE — MAU Note (Signed)
Latasha Parker returned call. [name] will be hear about 0930

## 2014-08-19 NOTE — MAU Note (Addendum)
Deaf and Hard of Hearing commutations was called. Left message.

## 2014-08-19 NOTE — Progress Notes (Signed)
Dr. Hulan Fray in to assess patient. Deaf interpretor at bedside.

## 2014-08-19 NOTE — MAU Provider Note (Signed)
History     CSN: [id number]  Arrival date and time: 08/19/14 1601   First Provider Initiated Contact with Patient 08/19/14 [phone]      Chief Complaint  Patient presents with  . Post-op Problem   HPI  [name] is a 39 y.o. [id number] who is POD #7 after LAVH and right salpingo-oophorectomy who presents to MAU today with bruising of the lower abdomen and lower abdominal pain. She was taking Percocet, but ran out of the Rx and is taking Ibuprofen. She rates pain at 5/10 now. She denies vaginal bleeding or fever. The incisions are healing well, she denies drainage. She states pain is worse with ambulation.   OB History    Gravida Para Term Preterm AB TAB SAB Ectopic Multiple Living   2 2 2       2       Past Medical History  Diagnosis Date  . Deaf   . SVD (spontaneous vaginal delivery)   . Hyperthyroidism   . Menorrhagia   . Pelvic pain   . Asthma   . Arthritis   . Cyst of ovary     right    Past Surgical History  Procedure Laterality Date  . Ovary tumor      cyst  . Foot surgery    . Diagnostic laparoscopy  2003    ovarian cyst removed  . Wisdom tooth extraction    . Laparoscopy N/A 07/17/2012    Procedure: LAPAROSCOPY OPERATIVE;  Surgeon: Mora Bellman, MD;  Location: [facility];  Service: Gynecology;  Laterality: N/A;  left oopherectomy and salpingectomy  . Multiple extractions with alveoloplasty N/A 06/06/2014    Procedure: MULTIPLE EXTRACTIONS WITH ALVEOLOPLASTY/REMOVAL OF TORI;  Surgeon: Gae Bon, DDS;  Location: [facility];  Service: Oral Surgery;  Laterality: N/A;  . Laparoscopic assisted vaginal hysterectomy N/A 08/12/2014    Procedure: LAPAROSCOPIC ASSISTED VAGINAL HYSTERECTOMY;  Surgeon: Emily Filbert, MD;  Location: [facility];  Service: Gynecology;  Laterality: N/A;  . Laparoscopic salpingo oopherectomy Right 08/12/2014    Procedure: REMOVE RIGHT SALPINGO OOPHORECTOMY;  Surgeon: Emily Filbert, MD;  Location: [facility];  Service: Gynecology;  Laterality: Right;     Family History  Problem Relation Age of Onset  . Cancer Maternal Grandmother   . Cancer Mother   . Cancer Father     History  Substance Use Topics  . Smoking status: Current Every Day Smoker -- 0.50 packs/day for 15 years    Types: Cigarettes  . Smokeless tobacco: Never Used  . Alcohol Use: No    Allergies:  Allergies  Allergen Reactions  . Tramadol Nausea And Vomiting  . Vicodin [Hydrocodone-Acetaminophen] Nausea And Vomiting    Prescriptions prior to admission  Medication Sig Dispense Refill Last Dose  . ALPRAZolam (XANAX) 0.5 MG tablet Take 1 tablet (0.5 mg total) by mouth 3 (three) times daily as needed for anxiety. 270 tablet 0 08/18/2014 at Unknown time  . beclomethasone (QVAR) 80 MCG/ACT inhaler Inhale 2 puffs into the lungs 2 (two) times daily. 1 Inhaler 6 08/19/2014 at Unknown time  . buPROPion (ZYBAN) 150 MG 12 hr tablet One pill daily for 3 days, then one pill twice per day (Patient taking differently: Take 150 mg by mouth 2 (two) times daily as needed. Smoking cessation) 60 tablet 3 Past Week at Unknown time  . estradiol (CLIMARA - DOSED IN MG/24 HR) 0.1 mg/24hr patch Place 1 patch (0.1 mg total) onto the skin once a week. 4 patch  12 08/18/2014 at Unknown time  . ibuprofen (ADVIL,MOTRIN) 800 MG tablet Take 1 tablet (800 mg total) by mouth every 8 (eight) hours as needed (mild pain). 30 tablet 0 08/18/2014 at Unknown time  . PROAIR HFA 108 (90 BASE) MCG/ACT inhaler INHALE 2 PUFFS INTO THE LUNGS EVERY 6 (SIX) HOURS AS NEEDED FOR WHEEZING OR SHORTNESS OF BREATH. 8.5 Inhaler 1 08/18/2014 at Unknown time  . oxyCODONE-acetaminophen (PERCOCET/ROXICET) 5-325 MG per tablet Take 1-2 tablets by mouth every 4 (four) hours as needed for severe pain (moderate to severe pain (when tolerating fluids)). (Patient not taking: Reported on 08/19/2014) 40 tablet 0     Review of Systems  Constitutional: Negative for fever and malaise/fatigue.  Gastrointestinal: Positive for abdominal pain.   Genitourinary: Negative for dysuria, urgency and frequency.       Neg - vaginal bleeding   Physical Exam   Blood pressure 106/67, pulse 88, temperature 97.9 F (36.6 C), temperature source Oral, last menstrual period 06/12/2014.  Physical Exam  Constitutional: She is oriented to person, place, and time. She appears well-developed and well-nourished. No distress.  HENT:  Head: Normocephalic.  Cardiovascular: Normal rate.   Respiratory: Effort normal.  GI: Soft. She exhibits no distension and no mass. There is tenderness (moderate tenderness to palpation of the lower abdomen). There is no rebound and no guarding.  Extensive dark purple bruising from just below the umbilicus to the mons pubis  Neurological: She is alert and oriented to person, place, and time.  Skin: Skin is warm and dry. No erythema.  Psychiatric: She has a normal mood and affect.   Results for orders placed or performed during the hospital encounter of 08/19/14 (from the past 24 hour(s))  Urinalysis, Routine w reflex microscopic     Status: Abnormal   Collection Time: 08/19/14  8:39 AM  Result Value Ref Range   Color, Urine AMBER (A) YELLOW   APPearance CLEAR CLEAR   Specific Gravity, Urine 1.020 1.005 - 1.030   pH 6.5 5.0 - 8.0   Glucose, UA NEGATIVE NEGATIVE mg/dL   Hgb urine dipstick MODERATE (A) NEGATIVE   Bilirubin Urine SMALL (A) NEGATIVE   Ketones, ur NEGATIVE NEGATIVE mg/dL   Protein, ur 30 (A) NEGATIVE mg/dL   Urobilinogen, UA 4.0 (H) 0.0 - 1.0 mg/dL   Nitrite NEGATIVE NEGATIVE   Leukocytes, UA SMALL (A) NEGATIVE  Urine microscopic-add on     Status: Abnormal   Collection Time: 08/19/14  8:39 AM  Result Value Ref Range   Squamous Epithelial / LPF RARE RARE   WBC, UA 0-2 <3 WBC/hpf   RBC / HPF 0-2 <3 RBC/hpf   Bacteria, UA MANY (A) RARE   Urine-Other MUCOUS PRESENT     MAU Course  Procedures None I evaluated her and found extensive bruising beneath the umbilicus down to the vulva. She tells  me that she is doing lots of lifting and bending, has "2 kids and 2 dogs". I have encouraged her to wear a girlde and do less lifting. She will start taking an OTC iron pill daily. She is not having any vaginal bleeding.   MDM UA and CBC today Dr. Hulan Fray to MAU to evaluate patient  Luvenia Redden, PA-C  08/19/2014, 9:11 AM  Assessment and Plan

## 2014-10-13 ENCOUNTER — Telehealth: Payer: Self-pay | Admitting: Pulmonary Disease

## 2014-10-13 MED ORDER — BECLOMETHASONE DIPROPIONATE 80 MCG/ACT IN AERS: 2.0000 | INHALATION_SPRAY | Freq: Two times a day (BID) | RESPIRATORY_TRACT | Status: DC

## 2014-10-13 NOTE — Telephone Encounter (Signed)
Rx has been sent in. Pt has upcoming appointment with [facility] on 12/01/14. Pt is aware. Nothing further was needed.

## 2014-10-16 ENCOUNTER — Other Ambulatory Visit: Payer: Self-pay | Admitting: Pulmonary Disease

## 2014-10-21 ENCOUNTER — Other Ambulatory Visit: Payer: Self-pay | Admitting: Pulmonary Disease

## 2014-10-21 MED ORDER — ALPRAZOLAM 0.5 MG PO TABS: 0.5000 mg | ORAL_TABLET | Freq: Three times a day (TID) | ORAL | Status: DC | PRN

## 2014-10-24 ENCOUNTER — Ambulatory Visit: Payer: Medicaid Other | Admitting: Obstetrics & Gynecology

## 2014-11-06 ENCOUNTER — Encounter (HOSPITAL_COMMUNITY): Payer: Self-pay | Admitting: Physical Medicine and Rehabilitation

## 2014-11-06 ENCOUNTER — Emergency Department (HOSPITAL_COMMUNITY)
Admission: EM | Admit: 2014-11-06 | Discharge: 2014-11-06 | Disposition: A | Discharge status: Home / Self Care | Payer: Medicaid Other | Attending: Emergency Medicine | Admitting: Emergency Medicine

## 2014-11-06 ENCOUNTER — Emergency Department (HOSPITAL_COMMUNITY): Payer: Medicaid Other

## 2014-11-06 DIAGNOSIS — N898 Other specified noninflammatory disorders of vagina: Secondary | ICD-10-CM | POA: Diagnosis not present

## 2014-11-06 DIAGNOSIS — J45909 Unspecified asthma, uncomplicated: Secondary | ICD-10-CM | POA: Diagnosis not present

## 2014-11-06 DIAGNOSIS — R350 Frequency of micturition: Secondary | ICD-10-CM | POA: Diagnosis not present

## 2014-11-06 DIAGNOSIS — M1990 Unspecified osteoarthritis, unspecified site: Secondary | ICD-10-CM | POA: Insufficient documentation

## 2014-11-06 DIAGNOSIS — H9190 Unspecified hearing loss, unspecified ear: Secondary | ICD-10-CM | POA: Diagnosis not present

## 2014-11-06 DIAGNOSIS — G8929 Other chronic pain: Secondary | ICD-10-CM | POA: Insufficient documentation

## 2014-11-06 DIAGNOSIS — R109 Unspecified abdominal pain: Secondary | ICD-10-CM | POA: Diagnosis present

## 2014-11-06 DIAGNOSIS — Z8639 Personal history of other endocrine, nutritional and metabolic disease: Secondary | ICD-10-CM | POA: Diagnosis not present

## 2014-11-06 DIAGNOSIS — Z7951 Long term (current) use of inhaled steroids: Secondary | ICD-10-CM | POA: Diagnosis not present

## 2014-11-06 DIAGNOSIS — Z720 Tobacco use: Secondary | ICD-10-CM | POA: Insufficient documentation

## 2014-11-06 DIAGNOSIS — R102 Pelvic and perineal pain: Secondary | ICD-10-CM | POA: Diagnosis not present

## 2014-11-06 DIAGNOSIS — R300 Dysuria: Secondary | ICD-10-CM | POA: Insufficient documentation

## 2014-11-06 LAB — CBC WITH DIFFERENTIAL/PLATELET
BASOS ABS: 0.1 10*3/uL (ref 0.0–0.1)
Basophils Relative: 1 % (ref 0–1)
Eosinophils Absolute: 0.3 10*3/uL (ref 0.0–0.7)
Eosinophils Relative: 3 % (ref 0–5)
HCT: 47.4 % — ABNORMAL HIGH (ref 36.0–46.0)
Hemoglobin: 16.2 g/dL — ABNORMAL HIGH (ref 12.0–15.0)
Lymphocytes Relative: 25 % (ref 12–46)
Lymphs Abs: 2.4 10*3/uL (ref 0.7–4.0)
MCH: 31.4 pg (ref 26.0–34.0)
MCHC: 34.2 g/dL (ref 30.0–36.0)
MCV: 91.9 fL (ref 78.0–100.0)
MONOS PCT: 6 % (ref 3–12)
Monocytes Absolute: 0.6 10*3/uL (ref 0.1–1.0)
NEUTROS PCT: 65 % (ref 43–77)
Neutro Abs: 6.2 10*3/uL (ref 1.7–7.7)
PLATELETS: 415 10*3/uL — AB (ref 150–400)
RBC: 5.16 MIL/uL — ABNORMAL HIGH (ref 3.87–5.11)
RDW: 14.3 % (ref 11.5–15.5)
WBC: 9.4 10*3/uL (ref 4.0–10.5)

## 2014-11-06 LAB — URINALYSIS, ROUTINE W REFLEX MICROSCOPIC
Bilirubin Urine: NEGATIVE
Glucose, UA: NEGATIVE mg/dL
Ketones, ur: NEGATIVE mg/dL
LEUKOCYTES UA: NEGATIVE
Nitrite: NEGATIVE
PH: 7 (ref 5.0–8.0)
PROTEIN: NEGATIVE mg/dL
SPECIFIC GRAVITY, URINE: 1.005 (ref 1.005–1.030)
Urobilinogen, UA: 0.2 mg/dL (ref 0.0–1.0)

## 2014-11-06 LAB — COMPREHENSIVE METABOLIC PANEL
ALT: 17 U/L (ref 14–54)
AST: 26 U/L (ref 15–41)
Albumin: 4 g/dL (ref 3.5–5.0)
Alkaline Phosphatase: 95 U/L (ref 38–126)
Anion gap: 7 (ref 5–15)
BUN: <5 mg/dL — ABNORMAL LOW (ref 6–20)
CO2: 25 mmol/L (ref 22–32)
CREATININE: 0.62 mg/dL (ref 0.44–1.00)
Calcium: 9.6 mg/dL (ref 8.9–10.3)
Chloride: 109 mmol/L (ref 101–111)
GLUCOSE: 102 mg/dL — AB (ref 65–99)
Potassium: 3.9 mmol/L (ref 3.5–5.1)
Sodium: 141 mmol/L (ref 135–145)
Total Bilirubin: 0.6 mg/dL (ref 0.3–1.2)
Total Protein: 6.9 g/dL (ref 6.5–8.1)

## 2014-11-06 LAB — URINE MICROSCOPIC-ADD ON

## 2014-11-06 LAB — LIPASE, BLOOD: Lipase: 15 U/L — ABNORMAL LOW (ref 22–51)

## 2014-11-06 LAB — WET PREP, GENITAL
Clue Cells Wet Prep HPF POC: NONE SEEN
Trich, Wet Prep: NONE SEEN
WBC, Wet Prep HPF POC: NONE SEEN
YEAST WET PREP: NONE SEEN

## 2014-11-06 MED ORDER — SODIUM CHLORIDE 0.9 % IV SOLN
INTRAVENOUS | Status: DC
  Administered 2014-11-06: 11:00:00 via INTRAVENOUS

## 2014-11-06 MED ORDER — IOHEXOL 300 MG/ML  SOLN: 25.0000 mL | Freq: Once | INTRAMUSCULAR | Status: DC | PRN

## 2014-11-06 MED ORDER — IOHEXOL 300 MG/ML  SOLN
100.0000 mL | Freq: Once | INTRAMUSCULAR | Status: AC | PRN
  Administered 2014-11-06: 100 mL via INTRAVENOUS

## 2014-11-06 MED ORDER — SODIUM CHLORIDE 0.9 % IV BOLUS (SEPSIS)
1000.0000 mL | Freq: Once | INTRAVENOUS | Status: AC
  Administered 2014-11-06: 1000 mL via INTRAVENOUS

## 2014-11-06 MED ORDER — NAPROXEN 500 MG PO TABS: 500.0000 mg | ORAL_TABLET | Freq: Two times a day (BID) | ORAL | Status: DC

## 2014-11-06 MED ORDER — ONDANSETRON HCL 4 MG/2ML IJ SOLN
4.0000 mg | Freq: Once | INTRAMUSCULAR | Status: AC
  Administered 2014-11-06: 4 mg via INTRAVENOUS
  Filled 2014-11-06: qty 2

## 2014-11-06 NOTE — Discharge Instructions (Signed)
Workup here today negative. Formal cultures from the pelvic exam are pending and will be notified if they are abnormal. The wet prep was normal. Urinalysis was normal. CT scan without any significant findings. Take the Naprosyn as needed. Follow up with your Dr. Cindee Lame follow-up with the acute care clinic at [facility] hospital.

## 2014-11-06 NOTE — ED Notes (Signed)
Spoke with Latasha Parker in the lab. Blood was sent down at 0959. Additional labs were ordered and were clicked off by this RN. Labs (CMP, lipase and CBC) still only say collected and aren't running. Latasha Parker states she will pull the blood and begin to run samples.

## 2014-11-06 NOTE — ED Notes (Signed)
Sign language interpretor at bedside 

## 2014-11-06 NOTE — ED Notes (Signed)
Pt presents to department for evaluation of urinary frequency. Pt is deaf. Reports urinating more frequently over the past several days. Also reports lower abdominal pain. Pt is alert and oriented x4.

## 2014-11-06 NOTE — ED Notes (Signed)
Pt is in stable condition upon d/c and ambulates from ED. 

## 2014-11-06 NOTE — ED Notes (Signed)
Hearing Impaired translator called.

## 2014-11-06 NOTE — ED Provider Notes (Signed)
CSN: [id number]     Arrival date & time 11/06/14  7517 History   First MD Initiated Contact with Patient 11/06/14 [phone]     Chief Complaint  Patient presents with  . Urinary Frequency     (Consider location/radiation/quality/duration/timing/severity/associated sxs/prior Treatment) Patient is a 39 y.o. female presenting with frequency. The history is provided by the patient. The history is limited by a language barrier. A language interpreter was used.  Urinary Frequency Associated symptoms include abdominal pain. Pertinent negatives include no chest pain, no headaches and no shortness of breath.   patient status post hysterectomy and either both or one ovary removal in March. Patient's been having persistent pelvic pain prior to the surgery and not resolved by the surgery. Patient stating that she's having dysuria and significant urinary frequency and vaginal discharge. Patient is hearing impaired. Translator used.  Past Medical History  Diagnosis Date  . Deaf   . SVD (spontaneous vaginal delivery)   . Hyperthyroidism   . Menorrhagia   . Pelvic pain   . Asthma   . Arthritis   . Cyst of ovary     right   Past Surgical History  Procedure Laterality Date  . Ovary tumor      cyst  . Foot surgery    . Diagnostic laparoscopy  2003    ovarian cyst removed  . Wisdom tooth extraction    . Laparoscopy N/A 07/17/2012    Procedure: LAPAROSCOPY OPERATIVE;  Surgeon: Mora Bellman, MD;  Location: [facility];  Service: Gynecology;  Laterality: N/A;  left oopherectomy and salpingectomy  . Multiple extractions with alveoloplasty N/A 06/06/2014    Procedure: MULTIPLE EXTRACTIONS WITH ALVEOLOPLASTY/REMOVAL OF TORI;  Surgeon: Gae Bon, DDS;  Location: [facility];  Service: Oral Surgery;  Laterality: N/A;  . Laparoscopic assisted vaginal hysterectomy N/A 08/12/2014    Procedure: LAPAROSCOPIC ASSISTED VAGINAL HYSTERECTOMY;  Surgeon: Emily Filbert, MD;  Location: [facility];  Service: Gynecology;  Laterality:  N/A;  . Laparoscopic salpingo oopherectomy Right 08/12/2014    Procedure: REMOVE RIGHT SALPINGO OOPHORECTOMY;  Surgeon: Emily Filbert, MD;  Location: [facility];  Service: Gynecology;  Laterality: Right;   Family History  Problem Relation Age of Onset  . Cancer Maternal Grandmother   . Cancer Mother   . Cancer Father    History  Substance Use Topics  . Smoking status: Current Every Day Smoker -- 0.50 packs/day for 15 years    Types: Cigarettes  . Smokeless tobacco: Never Used  . Alcohol Use: No   OB History    Gravida Para Term Preterm AB TAB SAB Ectopic Multiple Living   2 2 2       2      Review of Systems  Constitutional: Negative for fever.  HENT: Negative for congestion.   Respiratory: Negative for shortness of breath.   Cardiovascular: Negative for chest pain.  Gastrointestinal: Positive for abdominal pain. Negative for nausea and vomiting.  Genitourinary: Positive for dysuria, frequency, vaginal discharge and pelvic pain. Negative for vaginal bleeding.  Musculoskeletal: Negative for back pain.  Skin: Negative for rash.  Neurological: Negative for headaches.  Hematological: Does not bruise/bleed easily.  Psychiatric/Behavioral: Negative for confusion.      Allergies  Tramadol and Vicodin  Home Medications   Prior to Admission medications   Medication Sig Start Date End Date Taking? Authorizing Provider  ALPRAZolam Duanne Moron) 0.5 MG tablet Take 1 tablet (0.5 mg total) by mouth 3 (three) times daily as needed for anxiety. 10/21/14  Yes Chesley Mires, MD  beclomethasone (QVAR) 80 MCG/ACT inhaler Inhale 2 puffs into the lungs 2 (two) times daily. Patient taking differently: Inhale 2 puffs into the lungs 2 (two) times daily as needed (Shortness of breath).  10/13/14  Yes Chesley Mires, MD  estradiol (CLIMARA - DOSED IN MG/24 HR) 0.1 mg/24hr patch Place 1 patch (0.1 mg total) onto the skin once a week. 08/13/14  Yes Emily Filbert, MD  Iron TABS Take 1 tablet by mouth every morning.   Yes  Historical Provider, MD  PROAIR HFA 108 (90 BASE) MCG/ACT inhaler INHALE 2 PUFFS INTO THE LUNGS EVERY 6 (SIX) HOURS AS NEEDED FOR WHEEZING OR SHORTNESS OF BREATH. 07/25/14  Yes Chesley Mires, MD  buPROPion (ZYBAN) 150 MG 12 hr tablet One pill daily for 3 days, then one pill twice per day Patient not taking: Reported on 11/06/2014 09/05/13   Chesley Mires, MD  ibuprofen (ADVIL,MOTRIN) 800 MG tablet Take 1 tablet (800 mg total) by mouth every 8 (eight) hours as needed (mild pain). Patient not taking: Reported on 11/06/2014 08/13/14   Emily Filbert, MD  naproxen (NAPROSYN) 500 MG tablet Take 1 tablet (500 mg total) by mouth 2 (two) times daily. 11/06/14   Fredia Sorrow, MD  oxyCODONE-acetaminophen (PERCOCET/ROXICET) 5-325 MG per tablet Take 1-2 tablets by mouth every 4 (four) hours as needed for severe pain (moderate to severe pain (when tolerating fluids)). Patient not taking: Reported on 08/19/2014 08/13/14   Emily Filbert, MD  oxyCODONE-acetaminophen (PERCOCET/ROXICET) 5-325 MG per tablet Take 1-2 tablets by mouth every 6 (six) hours as needed. Patient not taking: Reported on 11/06/2014 08/19/14   Emily Filbert, MD   BP 115/73 mmHg  Pulse 69  Temp(Src) 98 F (36.7 C) (Oral)  Resp 18  SpO2 97%  LMP 06/12/2014 Physical Exam  Constitutional: She is oriented to person, place, and time. She appears well-developed and well-nourished. No distress.  HENT:  Head: Normocephalic and atraumatic.  Mouth/Throat: Oropharynx is clear and moist.  Eyes: Conjunctivae are normal. Pupils are equal, round, and reactive to light.  Neck: Normal range of motion. Neck supple.  Cardiovascular: Normal rate, regular rhythm and normal heart sounds.   No murmur heard. Pulmonary/Chest: Effort normal and breath sounds normal. No respiratory distress.  Abdominal: Soft. Bowel sounds are normal. There is no tenderness.  Genitourinary: Vagina normal.  Uterus absent. Vaginal vault without any significant discharge. External genitalia normal.   Musculoskeletal: Normal range of motion.  Neurological: She is alert and oriented to person, place, and time. No cranial nerve deficit. She exhibits normal muscle tone. Coordination normal.  Skin: Skin is warm. No rash noted.  Nursing note and vitals reviewed.   ED Course  Procedures (including critical care time) Labs Review Labs Reviewed  URINALYSIS, ROUTINE W REFLEX MICROSCOPIC (NOT AT [facility]) - Abnormal; Notable for the following:    Hgb urine dipstick SMALL (*)    All other components within normal limits  CBC WITH DIFFERENTIAL/PLATELET - Abnormal; Notable for the following:    RBC 5.16 (*)    Hemoglobin 16.2 (*)    HCT 47.4 (*)    Platelets 415 (*)    All other components within normal limits  COMPREHENSIVE METABOLIC PANEL - Abnormal; Notable for the following:    Glucose, Bld 102 (*)    BUN <5 (*)    All other components within normal limits  LIPASE, BLOOD - Abnormal; Notable for the following:    Lipase 15 (*)  All other components within normal limits  WET PREP, GENITAL  URINE MICROSCOPIC-ADD ON  HIV ANTIBODY (ROUTINE TESTING)  GC/CHLAMYDIA PROBE AMP ([place]) NOT AT [facility]   Results for orders placed or performed during the hospital encounter of 11/06/14  Wet prep, genital  Result Value Ref Range   Yeast Wet Prep HPF POC NONE SEEN NONE SEEN   Trich, Wet Prep NONE SEEN NONE SEEN   Clue Cells Wet Prep HPF POC NONE SEEN NONE SEEN   WBC, Wet Prep HPF POC NONE SEEN NONE SEEN  Urinalysis, Routine w reflex microscopic (not at [facility])  Result Value Ref Range   Color, Urine YELLOW YELLOW   APPearance CLEAR CLEAR   Specific Gravity, Urine 1.005 1.005 - 1.030   pH 7.0 5.0 - 8.0   Glucose, UA NEGATIVE NEGATIVE mg/dL   Hgb urine dipstick SMALL (A) NEGATIVE   Bilirubin Urine NEGATIVE NEGATIVE   Ketones, ur NEGATIVE NEGATIVE mg/dL   Protein, ur NEGATIVE NEGATIVE mg/dL   Urobilinogen, UA 0.2 0.0 - 1.0 mg/dL   Nitrite NEGATIVE NEGATIVE   Leukocytes, UA NEGATIVE NEGATIVE   Urine microscopic-add on  Result Value Ref Range   Squamous Epithelial / LPF RARE RARE   WBC, UA 0-2 <3 WBC/hpf   RBC / HPF 0-2 <3 RBC/hpf   Bacteria, UA RARE RARE  CBC with Differential/Platelet  Result Value Ref Range   WBC 9.4 4.0 - 10.5 K/uL   RBC 5.16 (H) 3.87 - 5.11 MIL/uL   Hemoglobin 16.2 (H) 12.0 - 15.0 g/dL   HCT 47.4 (H) 36.0 - 46.0 %   MCV 91.9 78.0 - 100.0 fL   MCH 31.4 26.0 - 34.0 pg   MCHC 34.2 30.0 - 36.0 g/dL   RDW 14.3 11.5 - 15.5 %   Platelets 415 (H) 150 - 400 K/uL   Neutrophils Relative % 65 43 - 77 %   Neutro Abs 6.2 1.7 - 7.7 K/uL   Lymphocytes Relative 25 12 - 46 %   Lymphs Abs 2.4 0.7 - 4.0 K/uL   Monocytes Relative 6 3 - 12 %   Monocytes Absolute 0.6 0.1 - 1.0 K/uL   Eosinophils Relative 3 0 - 5 %   Eosinophils Absolute 0.3 0.0 - 0.7 K/uL   Basophils Relative 1 0 - 1 %   Basophils Absolute 0.1 0.0 - 0.1 K/uL  Comprehensive metabolic panel  Result Value Ref Range   Sodium 141 135 - 145 mmol/L   Potassium 3.9 3.5 - 5.1 mmol/L   Chloride 109 101 - 111 mmol/L   CO2 25 22 - 32 mmol/L   Glucose, Bld 102 (H) 65 - 99 mg/dL   BUN <5 (L) 6 - 20 mg/dL   Creatinine, Ser 0.62 0.44 - 1.00 mg/dL   Calcium 9.6 8.9 - 10.3 mg/dL   Total Protein 6.9 6.5 - 8.1 g/dL   Albumin 4.0 3.5 - 5.0 g/dL   AST 26 15 - 41 U/L   ALT 17 14 - 54 U/L   Alkaline Phosphatase 95 38 - 126 U/L   Total Bilirubin 0.6 0.3 - 1.2 mg/dL   GFR calc non Af Amer >60 >60 mL/min   GFR calc Af Amer >60 >60 mL/min   Anion gap 7 5 - 15  Lipase, blood  Result Value Ref Range   Lipase 15 (L) 22 - 51 U/L      Imaging Review Ct Abdomen Pelvis W Contrast  11/06/2014   CLINICAL DATA:  Abdominal pain.  Hysterectomy.  EXAM: CT ABDOMEN AND PELVIS WITH CONTRAST  TECHNIQUE: Multidetector CT imaging of the abdomen and pelvis was performed using the standard protocol following bolus administration of intravenous contrast.  CONTRAST:  156mL OMNIPAQUE IOHEXOL 300 MG/ML  SOLN  COMPARISON:  Pelvic  ultrasound 06/19/2014  FINDINGS: Lung bases show mild dependent atelectasis. No infiltrate or effusion.  Subcentimeter low-density lesions in the right and left lobe liver most likely due to cysts. Small area of fatty infiltration in the left lobe liver adjacent to the falciform ligament. No liver mass lesion. Pancreas and spleen normal. Gallbladder and bile ducts normal.  Normal kidneys and adrenal glands. No renal mass or obstruction. No renal calculi. Normal urinary bladder.  Hysterectomy and removal the right ovary for dermoid cyst. No evidence of residual dermoid cyst.  Negative for bowel obstruction. Mild diverticulosis sigmoid and left colon. Negative for diverticulitis. Normal appendix  Negative for free fluid.  No mass or adenopathy.  IMPRESSION: No acute abnormality. Postop hysterectomy and right oophorectomy for dermoid cyst.  Sigmoid and left colon diverticulosis without evidence of acute diverticulitis.   Electronically Signed   By: Franchot Gallo M.D.   On: 11/06/2014 13:17     EKG Interpretation None      MDM   Final diagnoses:  Abdominal pain  Chronic pelvic pain in female    Patient sounds like she's had some pelvic pain for several months not resolved by the hysterectomy surgery done by GYN. This surgery was done in March. Today's workup pelvic exam without any significant findings. Wet prep negative for any evidence of infection. Cultures are pending. CT scan of the abdomen and pelvis without any acute findings. Labs without significant abnormalities. No urinary tract infection.    Fredia Sorrow, MD 11/06/14 1346

## 2014-11-07 ENCOUNTER — Ambulatory Visit: Payer: Medicaid Other | Admitting: Obstetrics & Gynecology

## 2014-11-07 LAB — GC/CHLAMYDIA PROBE AMP (~~LOC~~) NOT AT ARMC
Chlamydia: NEGATIVE
Neisseria Gonorrhea: NEGATIVE

## 2014-11-07 LAB — HIV ANTIBODY (ROUTINE TESTING W REFLEX): HIV Screen 4th Generation wRfx: NONREACTIVE

## 2014-12-01 ENCOUNTER — Ambulatory Visit: Payer: Medicaid Other | Admitting: Pulmonary Disease

## 2014-12-26 ENCOUNTER — Ambulatory Visit: Payer: Medicaid Other | Admitting: Pulmonary Disease

## 2015-01-22 ENCOUNTER — Telehealth: Payer: Self-pay | Admitting: Pulmonary Disease

## 2015-01-22 ENCOUNTER — Other Ambulatory Visit: Payer: Self-pay | Admitting: Pulmonary Disease

## 2015-01-22 MED ORDER — BECLOMETHASONE DIPROPIONATE 80 MCG/ACT IN AERS: 2.0000 | INHALATION_SPRAY | Freq: Two times a day (BID) | RESPIRATORY_TRACT | Status: DC

## 2015-01-22 MED ORDER — ALPRAZOLAM 0.5 MG PO TABS: 0.5000 mg | ORAL_TABLET | Freq: Three times a day (TID) | ORAL | Status: DC | PRN

## 2015-01-22 NOTE — Telephone Encounter (Signed)
Called and spoke to pt's boyfriend and informed him of the refill. Rx called in to preferred pharmacy. Pt verbalized understanding and denied any further questions or concerns at this time.

## 2015-01-22 NOTE — Telephone Encounter (Signed)
lmtcb for pt.  

## 2015-01-22 NOTE — Telephone Encounter (Signed)
Okay to send refills for Qvar and alprazolam.

## 2015-01-22 NOTE — Telephone Encounter (Signed)
Pt calling again about refills I let her know that they have already been sent to pharm.Latasha Parker

## 2015-01-22 NOTE — Telephone Encounter (Signed)
Pt is requesting refill on QVAR (already sent in) and alprazolam. She has pending appt 10/24 with Dr. Halford Chessman. Pt last had refill on alprazolam 0.5 mg 10/21/14 #270 x 0 refills Take 1 tablet (0.5 mg total) by mouth 3 (three) times daily as needed for anxiety.  Please advise Dr. Halford Chessman if okay to refill? thanks

## 2015-03-16 ENCOUNTER — Encounter: Payer: Self-pay | Admitting: Pulmonary Disease

## 2015-03-16 ENCOUNTER — Ambulatory Visit (INDEPENDENT_AMBULATORY_CARE_PROVIDER_SITE_OTHER): Payer: Medicaid Other | Admitting: Pulmonary Disease

## 2015-03-16 VITALS — BP 110/82 | HR 66 | Temp 98.3°F | Ht 68.0 in | Wt 157.0 lb

## 2015-03-16 DIAGNOSIS — Z23 Encounter for immunization: Secondary | ICD-10-CM

## 2015-03-16 DIAGNOSIS — Z720 Tobacco use: Secondary | ICD-10-CM | POA: Diagnosis not present

## 2015-03-16 DIAGNOSIS — J449 Chronic obstructive pulmonary disease, unspecified: Secondary | ICD-10-CM | POA: Diagnosis not present

## 2015-03-16 MED ORDER — VARENICLINE TARTRATE 1 MG PO TABS: 1.0000 mg | ORAL_TABLET | Freq: Two times a day (BID) | ORAL | Status: DC

## 2015-03-16 NOTE — Progress Notes (Signed)
Chief Complaint  Patient presents with  . Follow-up    pt following for COPD: pt states her breathing has been better,  chest tightness has decreased. no c/o SOB or Cough. no furtther concerns    History of Present Illness: Latasha Parker is a 39 y.o. female smoker with COPD/chronic bronchitis.  Her breathing has improved.  She is not having as much cough, wheeze, or sputum.  She uses proair occasionally.  She still smokes 1/2 pack per day.  She tried bupropion >> made her mouth dry.  She is not using bupropion on regular basis.  Her mood is better, and she does not feel depressed.  Interview done with assistant of sign language interpreter.   TESTS: CXR 09/05/13 >> no acute disease PFT 01/21/14 >> borderline obstruction, normal lung volumes/diffusion, no BD response  PMHx >> Deaf, Hyperthyroidism, Anxiety  PSHx, Medications, Allergies, Fhx, Shx reviewed.  Physical Exam: BP 110/82 mmHg  Pulse 66  Temp(Src) 98.3 F (36.8 C) (Oral)  Ht 5\' 8"  (1.727 m)  Wt 157 lb (71.215 kg)  BMI 23.88 kg/m2  SpO2 96%  LMP 06/12/2014  General - No distress ENT - No sinus tenderness, no oral exudate, no LAN Cardiac - s1s2 regular, no murmur Chest - No wheeze/rales/dullness Back - No focal tenderness Abd - Soft, non-tender Ext - No edema Neuro - Normal strength Skin - No rashes Psych - normal mood, and behavior   Assessment/Plan:  COPD with chronic bronchitis. She has borderline obstruction on PFT. Plan: - continue Qvar and prn proair  Tobacco abuse. Plan: - will have her try Chantix >> discussed dosing schedule, quit date for smoking, and side effects to monitor for   Chesley Mires, MD [facility] Pager:  [phone]

## 2015-03-16 NOTE — Patient Instructions (Signed)
Chantix >> 1 pill daily for 3 days, then 1 pill twice daily for 4 days >> after first week, then stop smoking and continue chantix 1 pill twice per day  Flu shot today  Follow up with Dr. Halford Chessman or Tammy Parrett in 3 months

## 2015-04-22 ENCOUNTER — Telehealth: Payer: Self-pay | Admitting: Pulmonary Disease

## 2015-04-22 ENCOUNTER — Other Ambulatory Visit: Payer: Self-pay | Admitting: Pulmonary Disease

## 2015-04-22 NOTE — Telephone Encounter (Signed)
Electronic refill request received for xanax 0.5 mg  Last OV 03/16/15, pending OV /24/17 Last filled 01/22/15 # 270 zero refills  Take 1 tablet (0.5 mg total ) by mouth 3 times daily as needed for anxiety  VS please advise if ok to refill   Thanks!

## 2015-04-22 NOTE — Telephone Encounter (Signed)
Pt is requesting Qvar (that is already sent) and alprazolam refills She has pending ov with TP on 06/16/15  P's last refill of alprazolam 0.5mg  on 01/22/15 #270 with 0 refills Take 1 tablet by mouth 3 times a day as needed for anxiety  Dr. Halford Chessman please advise if okay to refill

## 2015-04-23 MED ORDER — ALPRAZOLAM 0.5 MG PO TABS: ORAL_TABLET | ORAL | Status: DC

## 2015-04-23 NOTE — Telephone Encounter (Signed)
LMTCB x1 for pt RX for alprazolam called into [facility]

## 2015-04-23 NOTE — Telephone Encounter (Signed)
Okay to refill? 

## 2015-04-24 NOTE — Telephone Encounter (Signed)
LM for Ronalee Belts to return call

## 2015-04-27 NOTE — Telephone Encounter (Signed)
Called and left detailed message with answering service (pt is deaf) advising patient that her prescription has been called into [facility]. Advised to contact our office if any questions.

## 2015-06-16 ENCOUNTER — Ambulatory Visit: Payer: Medicaid Other | Admitting: Adult Health

## 2015-06-26 ENCOUNTER — Ambulatory Visit: Payer: Medicaid Other | Admitting: Adult Health

## 2015-07-17 ENCOUNTER — Encounter: Payer: Self-pay | Admitting: Adult Health

## 2015-07-17 ENCOUNTER — Ambulatory Visit (INDEPENDENT_AMBULATORY_CARE_PROVIDER_SITE_OTHER): Payer: Medicaid Other | Admitting: Adult Health

## 2015-07-17 VITALS — BP 104/74 | HR 68 | Temp 98.3°F | Ht 68.0 in | Wt 157.0 lb

## 2015-07-17 DIAGNOSIS — Z720 Tobacco use: Secondary | ICD-10-CM

## 2015-07-17 DIAGNOSIS — J4489 Other specified chronic obstructive pulmonary disease: Secondary | ICD-10-CM

## 2015-07-17 DIAGNOSIS — J449 Chronic obstructive pulmonary disease, unspecified: Secondary | ICD-10-CM | POA: Diagnosis not present

## 2015-07-17 MED ORDER — BECLOMETHASONE DIPROPIONATE 80 MCG/ACT IN AERS: INHALATION_SPRAY | RESPIRATORY_TRACT | Status: DC

## 2015-07-17 NOTE — Patient Instructions (Signed)
Most important goal is to quit smoking .  Continue on QVAR 4mcg 2 puffs Twice daily  , rinse after use.  Follow up Dr. Halford Chessman  In 6 months and As needed

## 2015-07-17 NOTE — Assessment & Plan Note (Signed)
Mild COPD/Asthma controlled on current regimen  encouarged on smoking cessation   Plan  Most important goal is to quit smoking .  Continue on QVAR 35mcg 2 puffs Twice daily  , rinse after use.  Follow up Dr. Halford Chessman  In 6 months and As needed

## 2015-07-17 NOTE — Assessment & Plan Note (Signed)
Smoking cessation  

## 2015-07-17 NOTE — Progress Notes (Signed)
Reviewed and agree with assessment/plan. 

## 2015-07-17 NOTE — Addendum Note (Signed)
Addended by: Osa Craver on: 07/17/2015 10:18 AM   Modules accepted: Orders

## 2015-07-17 NOTE — Progress Notes (Signed)
Subjective:    Patient ID: Latasha Parker, female    DOB: 1976/03/30, 40 y.o.   MRN: [id number]  HPI 40 yo smoker with Asthma smoker   TESTS: CXR 09/05/13 >> no acute disease PFT 01/21/14 >> borderline obstruction, normal lung volumes/diffusion, no BD response  07/17/2015 Follow up : Asthma/Smoker  Patient returns for a 4 month followup .  Pt states breathing is doing well and has no new complaints at this time. Pt states chantix affects her mood and says she has cut back on her smoking. She can  Not take chantix. But is still trying to quit smoking .  She is accompanied by a sign interpreter No flare in cough or wheezing Uses her ProAir rescue inhaler intermittently. Denies any chest pain, orthopnea, PND, leg swelling      TESTS: CXR 09/05/13 >> no acute disease  Past Medical History  Diagnosis Date  . Deaf   . SVD (spontaneous vaginal delivery)   . Hyperthyroidism   . Menorrhagia   . Pelvic pain   . Asthma   . Arthritis   . Cyst of ovary     right   Current Outpatient Prescriptions on File Prior to Visit  Medication Sig Dispense Refill  . ALPRAZolam (XANAX) 0.5 MG tablet TAKE 1 TABLET BY MOUTH 3 TIMES A DAY AS NEEDED FOR ANXIETY 270 tablet 0  . estradiol (CLIMARA - DOSED IN MG/24 HR) 0.1 mg/24hr patch Place 1 patch (0.1 mg total) onto the skin once a week. 4 patch 12  . ibuprofen (ADVIL,MOTRIN) 800 MG tablet Take 1 tablet (800 mg total) by mouth every 8 (eight) hours as needed (mild pain). 30 tablet 0  . Iron TABS Take 1 tablet by mouth every morning.    . naproxen (NAPROSYN) 500 MG tablet Take 1 tablet (500 mg total) by mouth 2 (two) times daily. 14 tablet 0  . PROAIR HFA 108 (90 BASE) MCG/ACT inhaler INHALE 2 PUFFS INTO THE LUNGS EVERY 6 (SIX) HOURS AS NEEDED FOR WHEEZING OR SHORTNESS OF BREATH. 8.5 Inhaler 1  . QVAR 80 MCG/ACT inhaler INHALE 2 PUFFS INTO THE LUNGS 2 (TWO) TIMES DAILY. 8.7 g 2  . varenicline (CHANTIX CONTINUING MONTH PAK) 1 MG tablet Take 1 tablet (1  mg total) by mouth 2 (two) times daily. (Patient not taking: Reported on 07/17/2015) 60 tablet 4   No current facility-administered medications on file prior to visit.      Review of Systems Constitutional:   No  weight loss, night sweats,  Fevers, chills, fatigue, or  lassitude.  HEENT:   No headaches,  Difficulty swallowing,  Tooth/dental problems, or  Sore throat,                No sneezing, itching, ear ache, nasal congestion, post nasal drip,   CV:  No chest pain,  Orthopnea, PND, swelling in lower extremities, anasarca, dizziness, palpitations, syncope.   GI  No heartburn, indigestion, abdominal pain, nausea, vomiting, diarrhea, change in bowel habits, loss of appetite, bloody stools.   Resp:    No chest wall deformity  Skin: no rash or lesions.  GU: no dysuria, change in color of urine, no urgency or frequency.  No flank pain, no hematuria   MS:  No joint pain or swelling.  No decreased range of motion.  No back pain.  Psych:  No change in mood or affect. No depression or anxiety.  No memory loss.         Objective:  Physical Exam  Filed Vitals:   07/17/15 0905  BP: 104/74  Pulse: 68  Temp: 98.3 F (36.8 C)  TempSrc: Oral  Height: 5\' 8"  (1.727 m)  Weight: 157 lb (71.215 kg)  SpO2: 95%    GEN: A/Ox3; pleasant , NAD,  Deaf-interpreter present   HEENT:  [place]/AT,  EACs-clear, TMs-wnl, NOSE-clear, THROAT-clear, no lesions, no postnasal drip or exudate noted.   NECK:  Supple w/ fair ROM; no JVD; normal carotid impulses w/o bruits; no thyromegaly or nodules palpated; no lymphadenopathy.  RESP  Clear  P & A; w/o, wheezes/ rales/ or rhonchi.no accessory muscle use, no dullness to percussion  CARD:  RRR, no m/r/g  , no peripheral edema, pulses intact, no cyanosis or clubbing.  GI:   Soft & nt; nml bowel sounds; no organomegaly or masses detected.  Musco: Warm bil, no deformities or joint swelling noted.   Neuro: alert, no focal deficits noted.    Skin: Warm,  no lesions or rashes   Korby Ratay NP-C  [facility]        Assessment & Plan:

## 2015-07-23 ENCOUNTER — Telehealth: Payer: Self-pay | Admitting: Pulmonary Disease

## 2015-07-23 ENCOUNTER — Other Ambulatory Visit: Payer: Self-pay | Admitting: Pulmonary Disease

## 2015-07-23 MED ORDER — ALPRAZOLAM 0.5 MG PO TABS: 0.5000 mg | ORAL_TABLET | Freq: Three times a day (TID) | ORAL | Status: DC | PRN

## 2015-07-23 NOTE — Telephone Encounter (Signed)
lmtcb X1 for pt's significant other, Ronalee Belts.

## 2015-07-23 NOTE — Telephone Encounter (Signed)
Please advise on alprazolam refill  Last gave her # 270 tablets 3 x daily prn on 04/23/15

## 2015-07-23 NOTE — Telephone Encounter (Signed)
I had sent VS a refill encounter earlier today asking if rx was okay to be refill  Looks like we printed rx with 5 refills but this was never faxed  I have phoned it in for the pt and spoke with Ronalee Belts to make him aware  Nothing further needed

## 2015-07-23 NOTE — Telephone Encounter (Signed)
Ronalee Belts returning call [phone].Hillery Hunter

## 2015-09-18 ENCOUNTER — Other Ambulatory Visit: Payer: Self-pay | Admitting: Pulmonary Disease

## 2015-09-21 ENCOUNTER — Other Ambulatory Visit: Payer: Self-pay | Admitting: Obstetrics & Gynecology

## 2015-10-16 ENCOUNTER — Other Ambulatory Visit: Payer: Self-pay | Admitting: Obstetrics & Gynecology

## 2015-10-16 ENCOUNTER — Ambulatory Visit: Payer: Medicaid Other | Admitting: Adult Health

## 2015-10-16 ENCOUNTER — Telehealth: Payer: Self-pay | Admitting: Adult Health

## 2015-10-16 DIAGNOSIS — Z3045 Encounter for surveillance of transdermal patch hormonal contraceptive device: Secondary | ICD-10-CM

## 2015-10-16 NOTE — Telephone Encounter (Signed)
Per pt's chart, Alprazolam 0.5mg  TID prn #270 w/ 1 additional refill was just sent to the pharmacy on 3.2.17 and QVAR 80 was last refilled on 4.28.17. Called [facility] [address] and spoke with Merrilee Seashore to inquire about the Alprazolam because pt should still have 1 more #270 refill left > this was last refilled on 5.1.17 for #90 so pt should still have 15 tabs left.  Called spoke with Ronalee Belts and discussed the above.  Ronalee Belts did mention that he is not 100% sure that these are the medications pt is needing refills on (pt is deaf) but he would relay info to her.  Did ask Ronalee Belts to make sure that if she is requesting a refill on the Alprazolam, she should be taking this medication only [place].  Ronalee Belts voiced his understanding.  Nothing further needed; will sign off.

## 2015-10-21 ENCOUNTER — Ambulatory Visit (INDEPENDENT_AMBULATORY_CARE_PROVIDER_SITE_OTHER): Payer: Medicaid Other | Admitting: Adult Health

## 2015-10-21 ENCOUNTER — Encounter: Payer: Self-pay | Admitting: Adult Health

## 2015-10-21 VITALS — BP 110/86 | HR 77 | Temp 98.1°F | Ht 68.0 in | Wt 137.0 lb

## 2015-10-21 DIAGNOSIS — J449 Chronic obstructive pulmonary disease, unspecified: Secondary | ICD-10-CM | POA: Diagnosis not present

## 2015-10-21 NOTE — Assessment & Plan Note (Signed)
Doing well on current regimen .  Plan  Most important goal is to quit smoking .  Continue on QVAR 45mcg 2 puffs Twice daily  , rinse after use.  Follow up Dr. Halford Chessman  In 6 months and As needed

## 2015-10-21 NOTE — Progress Notes (Signed)
Subjective:    Patient ID: Latasha Parker, female    DOB: 1975/10/19, 40 y.o.   MRN: [id number]  HPI 40 yo smoker with Asthma smoker   TESTS: CXR 09/05/13 >> no acute disease PFT 01/21/14 >> borderline obstruction, normal lung volumes/diffusion, no BD response  10/21/2015 Follow up : Asthma/Smoker  Patient returns for a 3 month followup . Doing well on QVAR Twice daily  . Pt states breathing is doing well and has no new complaints at this time. Still trying to quit smoking . Cessation is encouraged.  She is accompanied by a sign interpreter No flare in cough or wheezing Uses her ProAir rescue inhaler intermittently. Denies any chest pain, orthopnea, PND, leg swelling      TESTS: CXR 09/05/13 >> no acute disease  Past Medical History  Diagnosis Date  . Deaf   . SVD (spontaneous vaginal delivery)   . Hyperthyroidism   . Menorrhagia   . Pelvic pain   . Asthma   . Arthritis   . Cyst of ovary     right   Current Outpatient Prescriptions on File Prior to Visit  Medication Sig Dispense Refill  . ALPRAZolam (XANAX) 0.5 MG tablet Take 1 tablet (0.5 mg total) by mouth 3 (three) times daily as needed. 270 tablet 1  . beclomethasone (QVAR) 80 MCG/ACT inhaler INHALE 2 PUFFS INTO THE LUNGS 2 (TWO) TIMES DAILY. 8.7 g 5  . estradiol (CLIMARA - DOSED IN MG/24 HR) 0.1 mg/24hr patch PLACE 1 PATCH (0.1 MG TOTAL) ONTO THE SKIN ONCE A WEEK. 4 patch 9  . ibuprofen (ADVIL,MOTRIN) 800 MG tablet Take 1 tablet (800 mg total) by mouth every 8 (eight) hours as needed (mild pain). 30 tablet 0  . Iron TABS Take 1 tablet by mouth every morning.    . naproxen (NAPROSYN) 500 MG tablet Take 1 tablet (500 mg total) by mouth 2 (two) times daily. 14 tablet 0  . PROAIR HFA 108 (90 BASE) MCG/ACT inhaler INHALE 2 PUFFS INTO THE LUNGS EVERY 6 (SIX) HOURS AS NEEDED FOR WHEEZING OR SHORTNESS OF BREATH. 8.5 Inhaler 1  . QVAR 80 MCG/ACT inhaler INHALE 2 PUFFS INTO THE LUNGS 2 (TWO) TIMES DAILY. 8.7 g 5  .  varenicline (CHANTIX CONTINUING MONTH PAK) 1 MG tablet Take 1 tablet (1 mg total) by mouth 2 (two) times daily. (Patient not taking: Reported on 10/21/2015) 60 tablet 4   No current facility-administered medications on file prior to visit.      Review of Systems Constitutional:   No  weight loss, night sweats,  Fevers, chills, fatigue, or  lassitude.  HEENT:   No headaches,  Difficulty swallowing,  Tooth/dental problems, or  Sore throat,                No sneezing, itching, ear ache, nasal congestion, post nasal drip,   CV:  No chest pain,  Orthopnea, PND, swelling in lower extremities, anasarca, dizziness, palpitations, syncope.   GI  No heartburn, indigestion, abdominal pain, nausea, vomiting, diarrhea, change in bowel habits, loss of appetite, bloody stools.   Resp:    No chest wall deformity  Skin: no rash or lesions.  GU: no dysuria, change in color of urine, no urgency or frequency.  No flank pain, no hematuria   MS:  No joint pain or swelling.  No decreased range of motion.  No back pain.  Psych:  No change in mood or affect. No depression or anxiety.  No memory loss.  Objective:   Physical Exam  Filed Vitals:   10/21/15 1107  BP: 110/86  Pulse: 77  Temp: 98.1 F (36.7 C)  TempSrc: Oral  Height: 5\' 8"  (1.727 m)  Weight: 137 lb (62.143 kg)  SpO2: 96%    GEN: A/Ox3; pleasant , NAD,  Deaf-interpreter present   HEENT:  /AT,  EACs-clear, TMs-wnl, NOSE-clear, THROAT-clear, no lesions, no postnasal drip or exudate noted.   NECK:  Supple w/ fair ROM; no JVD; normal carotid impulses w/o bruits; no thyromegaly or nodules palpated; no lymphadenopathy.  RESP  Clear  P & A; w/o, wheezes/ rales/ or rhonchi.no accessory muscle use, no dullness to percussion  CARD:  RRR, no m/r/g  , no peripheral edema, pulses intact, no cyanosis or clubbing.  GI:   Soft & nt; nml bowel sounds; no organomegaly or masses detected.  Musco: Warm bil, no deformities or joint  swelling noted.   Neuro: alert, no focal deficits noted.    Skin: Warm, no lesions or rashes   Latasha Sarabia NP-C  [facility]  10/21/2015        Assessment & Plan:

## 2015-10-21 NOTE — Patient Instructions (Signed)
Most important goal is to quit smoking .  Continue on QVAR 4mcg 2 puffs Twice daily  , rinse after use.  Follow up Dr. Halford Chessman  In 6 months and As needed

## 2015-10-22 NOTE — Progress Notes (Signed)
Reviewed and agree with assessment/plan.  Chesley Mires, MD [facility] 10/22/2015, 3:23 AM Pager:  [phone]

## 2015-12-06 IMAGING — US US TRANSVAGINAL NON-OB
1 series · 14 of 25 positions shown · non-contrast
Comparison: 04/03/2014

CLINICAL DATA: Pelvic pain, dysmenorrhea. Prior left
salpingo-oophorectomy.



[Series 1: us pelvis complete · 75 acquisitions, 14 frames shown]
[im 1/75]
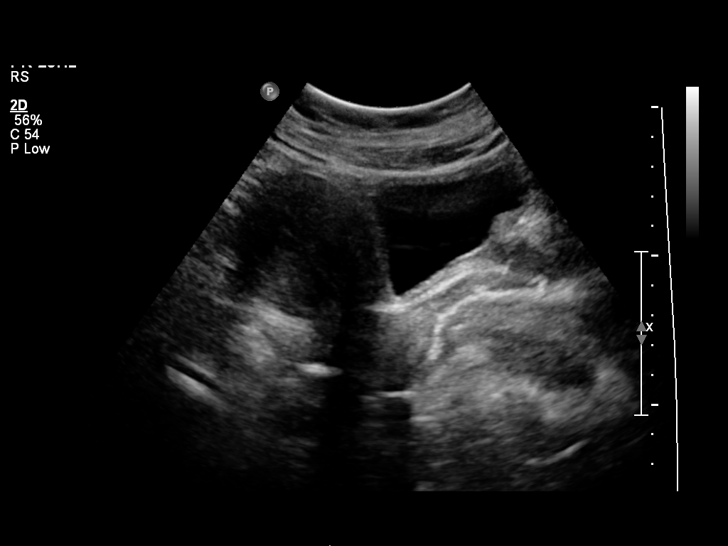
[im 7/75]
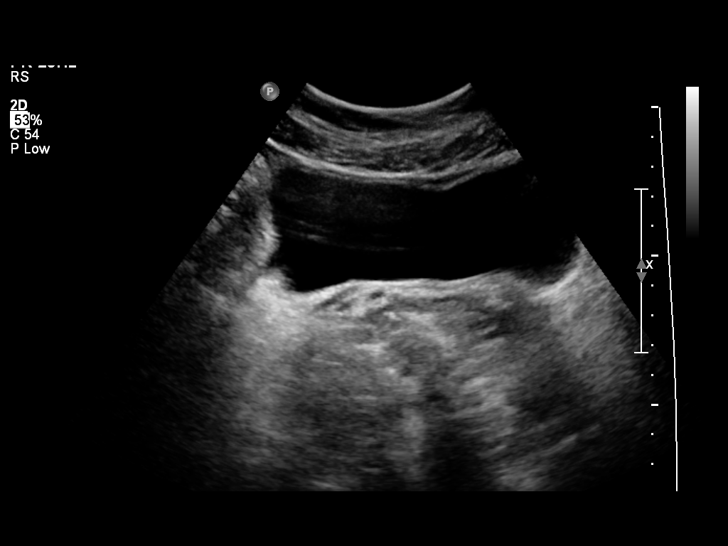
[im 13/75]
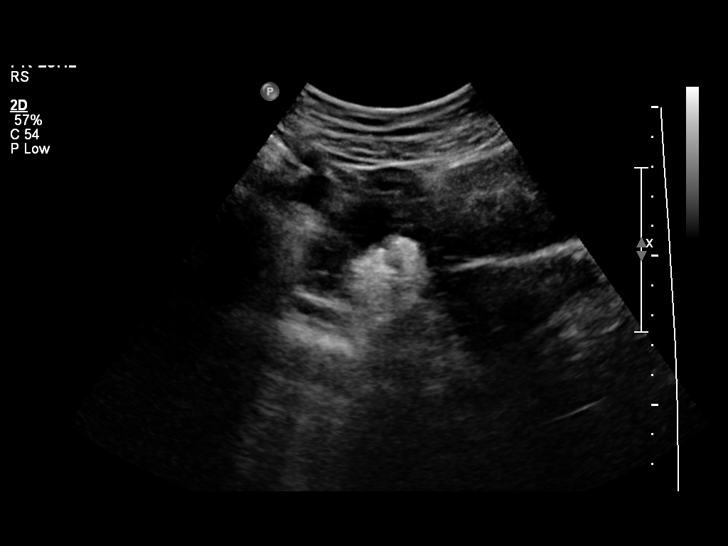
[im 19/75]
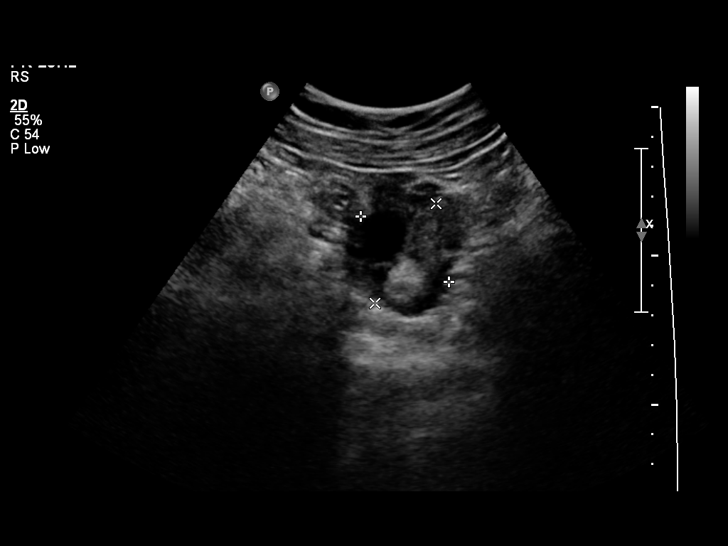
[im 25/75]
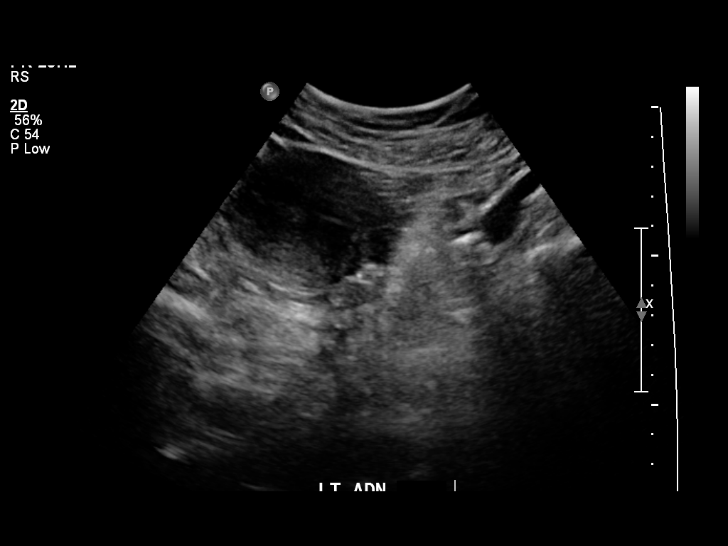
[im 28/75]
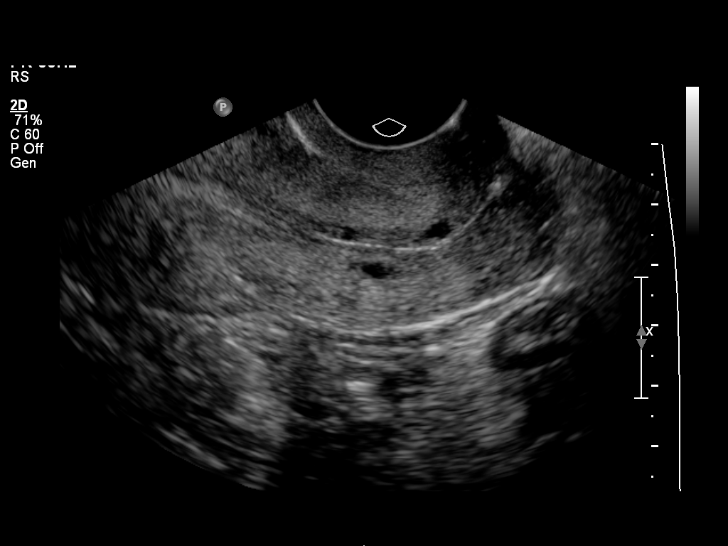
[im 34/75]
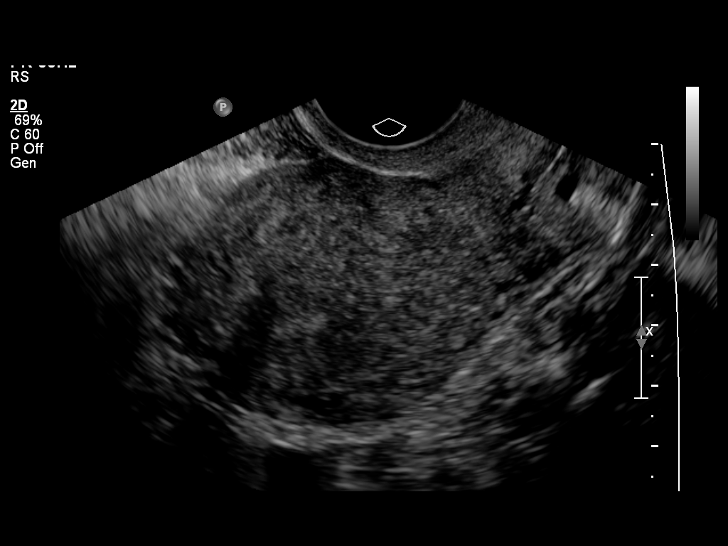
[im 41/75]
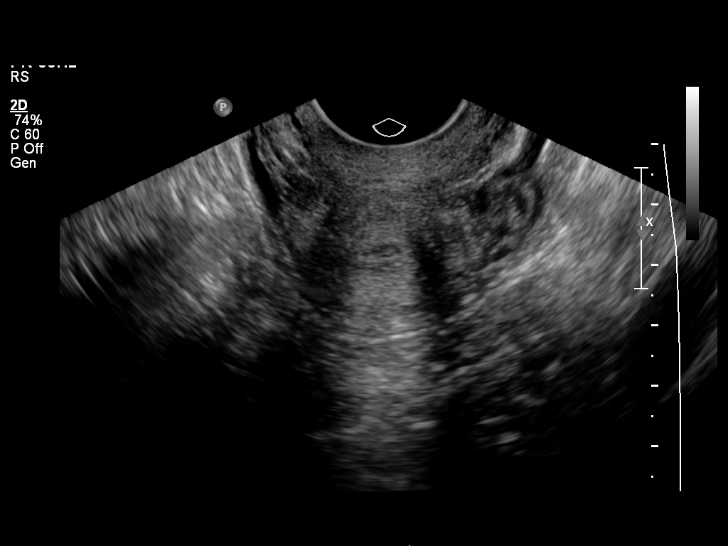
[im 47/75]
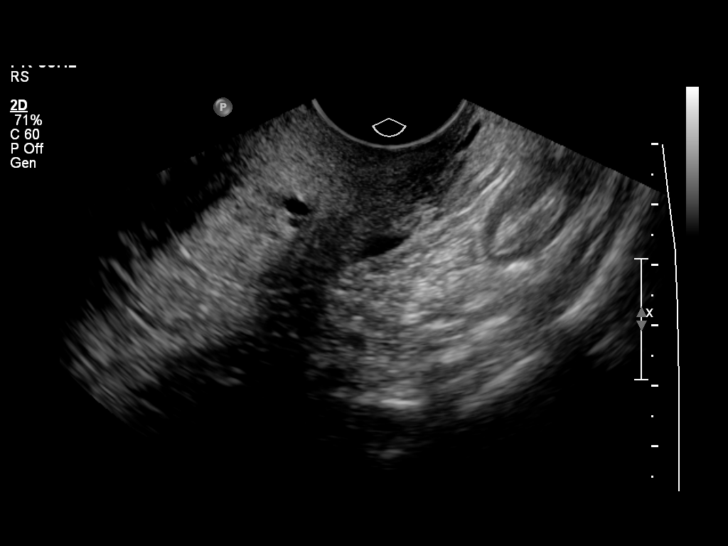
[im 50/75]
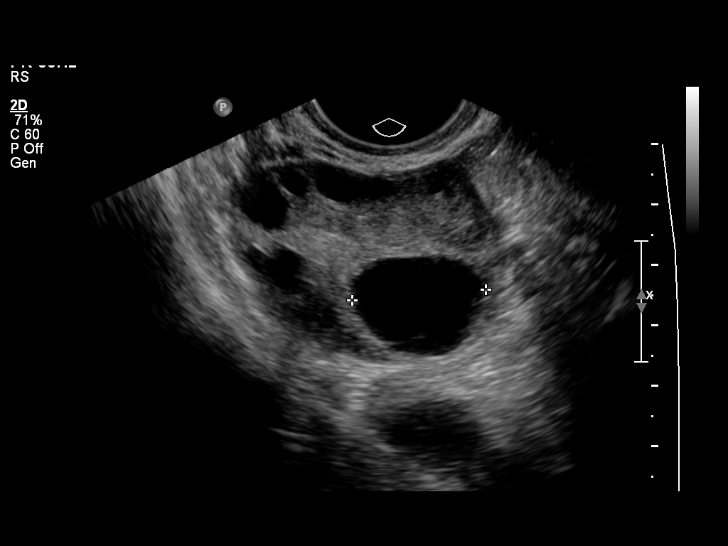
[im 56/75]
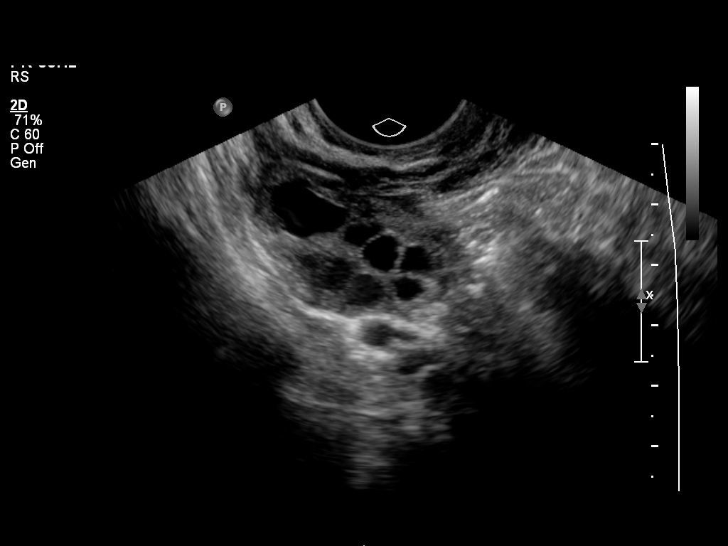
[im 62/75]
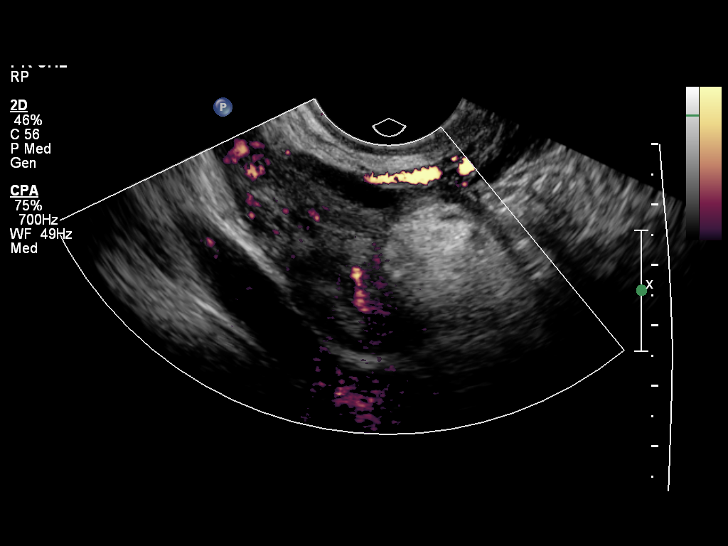
[im 68/75]
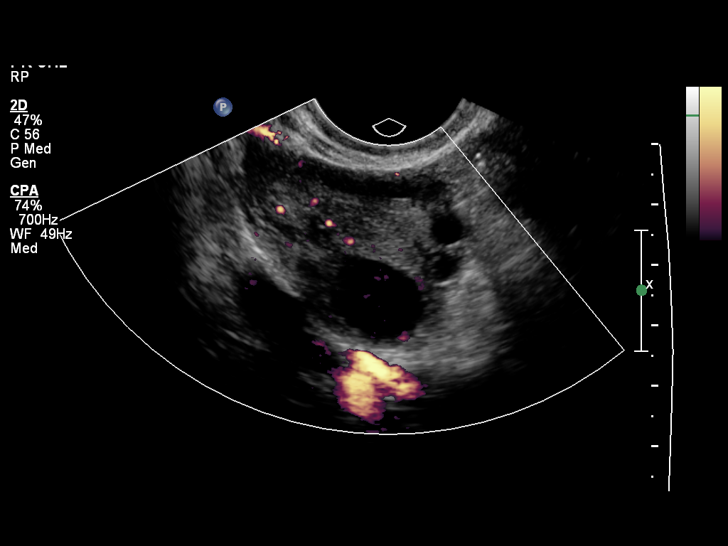
[im 75/75]
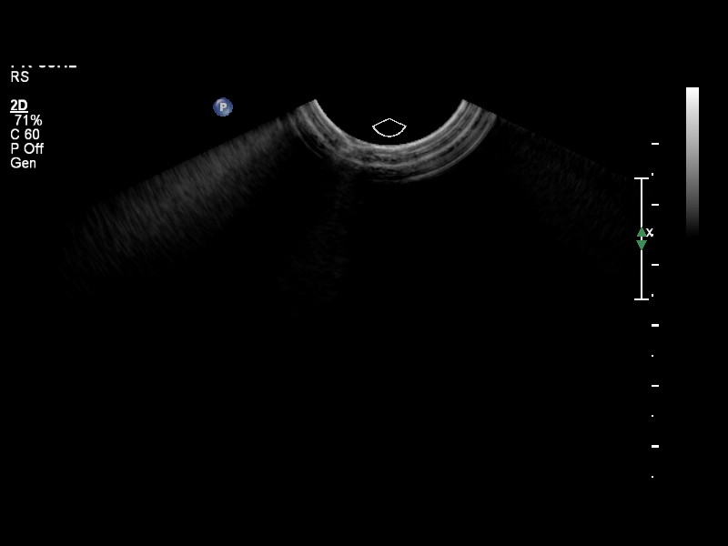

[14 of 25 positions shown; findings below may reference images not displayed]

FINDINGS: Uterus

Measurements: 8.8 x 4.4 x 5.4 cm. No fibroids or other mass
visualized.

Endometrium

Thickness: 2 mm.  No focal abnormality visualized.

Right ovary

Measurements: 6.0 x 3.2 x 4.5 cm. 2.7 x 2.1 x 2.9 cm echogenic solid
mass, suspicious for ovarian dermoid, unchanged.

Left ovary

Surgically absent.

Other findings

No free fluid.
IMPRESSION: Stable 2.9 cm right ovarian dermoid.

Left ovary is surgically absent.

## 2015-12-22 ENCOUNTER — Other Ambulatory Visit: Payer: Self-pay | Admitting: Pulmonary Disease

## 2015-12-28 NOTE — Telephone Encounter (Signed)
Pt requesting refill on Alprazolam 0.5mg . Last refill: 07/23/15 #270 with 1 refill, 1 tab TID prn Last ov: 10/21/15 with TP Next ov: recall for 03/2016 with VS.  VS please advise on refill.  Thanks!

## 2015-12-29 ENCOUNTER — Telehealth: Payer: Self-pay

## 2015-12-29 NOTE — Telephone Encounter (Signed)
VS please advise on refill. Thanks. 

## 2015-12-29 NOTE — Telephone Encounter (Signed)
How much was filled in 10/2015 , how much is she using ?  The last refill in epic looks like 07/2015 , when/where did she feel in 10/2015 and who?

## 2015-12-29 NOTE — Telephone Encounter (Signed)
Can discuss with Dr. Halford Chessman  , it appears to be 1 month early for refill  90 day supply filled march and June with 270 xanax each time.

## 2015-12-29 NOTE — Telephone Encounter (Signed)
Pt requesting refill on Xanax.   Last Fill 10/23/15 LOV 10/21/15   VS is out of town. TP - Please advise as to refill of Xanax. Thanks!

## 2015-12-29 NOTE — Telephone Encounter (Signed)
Per sheena, she called the pharmacy and was told pt was refilling RX that was given to her by VS on 07/23/15 bc he was given 1 refill on RX. So on 10/23/15 she received #270 tablets and she is taking this TID PRN.

## 2015-12-30 MED ORDER — ALPRAZOLAM 0.5 MG PO TABS: 0.5000 mg | ORAL_TABLET | Freq: Three times a day (TID) | ORAL | 0 refills | Status: DC | PRN

## 2015-12-30 NOTE — Telephone Encounter (Signed)
[facility] and left a message on the machine for the pharmacy that per VS ok to refill the xanax.  Called in for #90 with no refills.

## 2015-12-30 NOTE — Telephone Encounter (Signed)
Okay to send refill. 

## 2015-12-30 NOTE — Telephone Encounter (Signed)
[facility] [address] and they state that pt is requesting refill on her Xanax. They show in computer that [facility] [address] and they can't transfer back. They also show that pt picked up #[facility] on [facility]/22/15 [facility] [address] and has additional refills left. As pt just picked up 90d supply and has current refills available, there is no need for refill at this time. Nothing further needed.

## 2015-12-30 NOTE — Telephone Encounter (Signed)
Okay to refill now.

## 2016-02-20 ENCOUNTER — Other Ambulatory Visit: Payer: Self-pay | Admitting: Pulmonary Disease

## 2016-02-22 ENCOUNTER — Telehealth: Payer: Self-pay | Admitting: Pulmonary Disease

## 2016-02-22 MED ORDER — ALPRAZOLAM 0.5 MG PO TABS: 0.5000 mg | ORAL_TABLET | Freq: Three times a day (TID) | ORAL | 0 refills | Status: DC | PRN

## 2016-02-22 NOTE — Telephone Encounter (Signed)
Pt requesting refill of Xanax .5mg  #90 - last filled 12/30/15 Pt scheduled OV with VS for 04/19/2016 Xanax has been refilled #90, patient will have to call back for additional refills as this Rx is being signed under Rexene Edison, NP. Nothing further needed.

## 2016-03-22 ENCOUNTER — Telehealth: Payer: Self-pay | Admitting: Pulmonary Disease

## 2016-03-22 ENCOUNTER — Other Ambulatory Visit: Payer: Self-pay | Admitting: Adult Health

## 2016-03-22 ENCOUNTER — Other Ambulatory Visit: Payer: Self-pay | Admitting: Pulmonary Disease

## 2016-03-22 MED ORDER — ALPRAZOLAM 0.5 MG PO TABS: 0.5000 mg | ORAL_TABLET | Freq: Three times a day (TID) | ORAL | 0 refills | Status: DC | PRN

## 2016-03-22 MED ORDER — BECLOMETHASONE DIPROPIONATE 80 MCG/ACT IN AERS: INHALATION_SPRAY | RESPIRATORY_TRACT | 5 refills | Status: DC

## 2016-03-22 NOTE — Telephone Encounter (Signed)
LMOM with recording service that refill for Alprazolam and Qvar was called to pharmacy.   Nothing further needed.

## 2016-03-22 NOTE — Telephone Encounter (Signed)
lmomtcb x1 

## 2016-03-22 NOTE — Telephone Encounter (Signed)
Patient returned call, CB [phone].

## 2016-03-22 NOTE — Telephone Encounter (Signed)
Called and spoke with pt and she is requesting a refill of the alprazolam and the qvar.  qvar has been sent to the pharmacy but need to know if ok to send in the alprazolam. Pt has a pending appt with VS on 11/28.  Please advise. thanks

## 2016-03-22 NOTE — Telephone Encounter (Signed)
Okay to refill? 

## 2016-04-19 ENCOUNTER — Ambulatory Visit (INDEPENDENT_AMBULATORY_CARE_PROVIDER_SITE_OTHER): Payer: Medicaid Other | Admitting: Pulmonary Disease

## 2016-04-19 ENCOUNTER — Encounter: Payer: Self-pay | Admitting: Pulmonary Disease

## 2016-04-19 VITALS — BP 92/60 | HR 60 | Ht 68.0 in | Wt 133.6 lb

## 2016-04-19 DIAGNOSIS — J449 Chronic obstructive pulmonary disease, unspecified: Secondary | ICD-10-CM | POA: Diagnosis not present

## 2016-04-19 DIAGNOSIS — Z23 Encounter for immunization: Secondary | ICD-10-CM | POA: Diagnosis not present

## 2016-04-19 DIAGNOSIS — Z720 Tobacco use: Secondary | ICD-10-CM | POA: Diagnosis not present

## 2016-04-19 MED ORDER — ALPRAZOLAM 0.5 MG PO TABS: 0.5000 mg | ORAL_TABLET | Freq: Three times a day (TID) | ORAL | 1 refills | Status: DC | PRN

## 2016-04-19 MED ORDER — BUPROPION HCL ER (SMOKING DET) 150 MG PO TB12: 150.0000 mg | ORAL_TABLET | Freq: Two times a day (BID) | ORAL | 2 refills | Status: DC

## 2016-04-19 NOTE — Progress Notes (Addendum)
Current Outpatient Prescriptions on File Prior to Visit  Medication Sig  . ALPRAZolam (XANAX) 0.5 MG tablet Take 1 tablet (0.5 mg total) by mouth 3 (three) times daily as needed.  Marland Kitchen estradiol (CLIMARA - DOSED IN MG/24 HR) 0.1 mg/24hr patch PLACE 1 PATCH (0.1 MG TOTAL) ONTO THE SKIN ONCE A WEEK.  Marland Kitchen ibuprofen (ADVIL,MOTRIN) 800 MG tablet Take 1 tablet (800 mg total) by mouth every 8 (eight) hours as needed (mild pain).  . Iron TABS Take 1 tablet by mouth every morning.  . naproxen (NAPROSYN) 500 MG tablet Take 1 tablet (500 mg total) by mouth 2 (two) times daily.  Marland Kitchen PROAIR HFA 108 (90 BASE) MCG/ACT inhaler INHALE 2 PUFFS INTO THE LUNGS EVERY 6 (SIX) HOURS AS NEEDED FOR WHEEZING OR SHORTNESS OF BREATH.  Marland Kitchen QVAR 80 MCG/ACT inhaler INHALE 2 PUFFS INTO THE LUNGS 2 (TWO) TIMES DAILY.   No current facility-administered medications on file prior to visit.      Chief Complaint  Patient presents with  . Follow-up    Pt states that her breathing has been doing good since last OV. Pt reports having reecent cold with mucus production - this is resolved. Pt wants flu vaccine today if able.     Pulmonary tests CXR 09/05/13 >> no acute disease PFT 01/21/14 >> borderline obstruction, normal lung volumes/diffusion, no BD response  Past medical history Deaf, Hyperthyroidism, Anxiety  Past surgical history, Family history, Social history, Allergies reviewed  Vital signs BP 92/60 (BP Location: Right Arm, Cuff Size: Normal)   Pulse 60   Ht 5\' 8"  (1.727 m)   Wt 133 lb 9.6 oz (60.6 kg)   LMP 06/12/2014   SpO2 97%   BMI 20.31 kg/m   History of Present Illness: Latasha Parker is a 40 y.o. female smoker with COPD/chronic bronchitis.  Interview conducted with assistance of sign language interpreter.  She is still smoking.  Tried chantix >> made her nauseous.  She is not having cough, wheeze, or sputum.  She had trouble with her dentures and was having trouble eating.  She has lost about 25 lbs since  last year.  Physical Exam:  General - No distress ENT - No sinus tenderness, no oral exudate, no LAN Cardiac - s1s2 regular, no murmur Chest - No wheeze/rales/dullness Back - No focal tenderness Abd - Soft, non-tender Ext - No edema Neuro - Normal strength Skin - No rashes Psych - normal mood, and behavior   Assessment/Plan:  COPD with chronic bronchitis. - continue Qvar and prn proair  Tobacco abuse. - will have her try bupropion >> discussed dosing schedule, quit date, and side effects to monitor for  Anxiety. - refill xanax   Patient Instructions  Bupropion 150 mg pill >> take 1 pill daily for 3 days, then 1 pill twice daily.  Stop smoking after you are on the medicine for 1 week.  Flu shot today  Follow up in 2 months with Dr. Halford Chessman or Nurse practitioner    Chesley Mires, MD [facility] Pager:  [phone] 04/19/2016, 3:03 PM

## 2016-04-19 NOTE — Addendum Note (Signed)
Addended by: Chesley Mires on: 04/19/2016 03:08 PM   Modules accepted: Orders

## 2016-04-19 NOTE — Patient Instructions (Signed)
Bupropion 150 mg pill >> take 1 pill daily for 3 days, then 1 pill twice daily.  Stop smoking after you are on the medicine for 1 week.  Flu shot today  Follow up in 2 months with Dr. Halford Chessman or Nurse practitioner

## 2016-04-24 IMAGING — CT CT ABD-PELV W/ CM
2 of 4 series · 17 of 46 positions shown, 19 images · IV contrast (Omni 300)
Comparison: Pelvic ultrasound 06/19/2014

CLINICAL DATA: Abdominal pain.  Hysterectomy.

EXAM:
CT ABDOMEN AND PELVIS WITH CONTRAST
TECHNIQUE: Multidetector CT imaging of the abdomen and pelvis was performed
using the standard protocol following bolus administration of
intravenous contrast.
CONTRAST:  100mL OMNIPAQUE IOHEXOL 300 MG/ML  SOLN

[Series 2: abd/ pelvis 5.0 i30f 1 · axial · 0.81mm/px · z∈[-210,+180]mm · 14 of 86 slices shown, 16 images]
[im 4/86  soft-tissue]
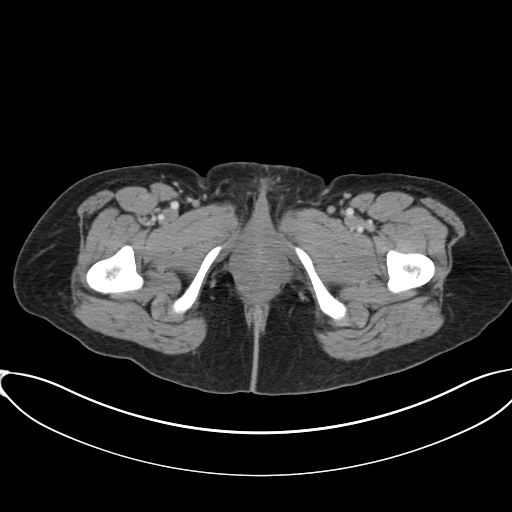
[im 4/86  bone]
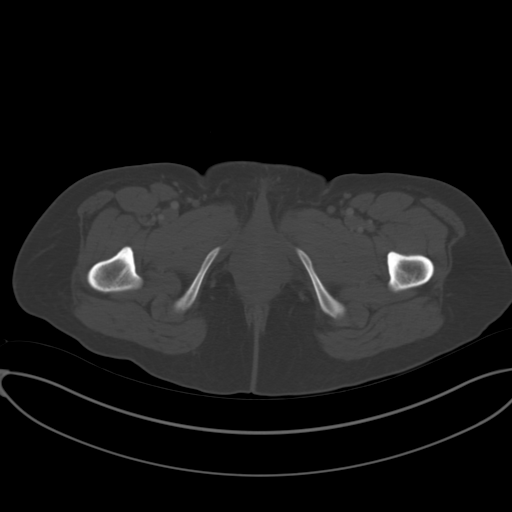
[im 11/86  soft-tissue]
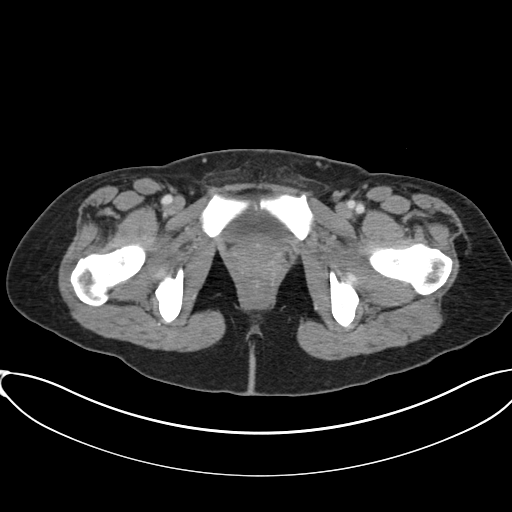
[im 18/86  soft-tissue]
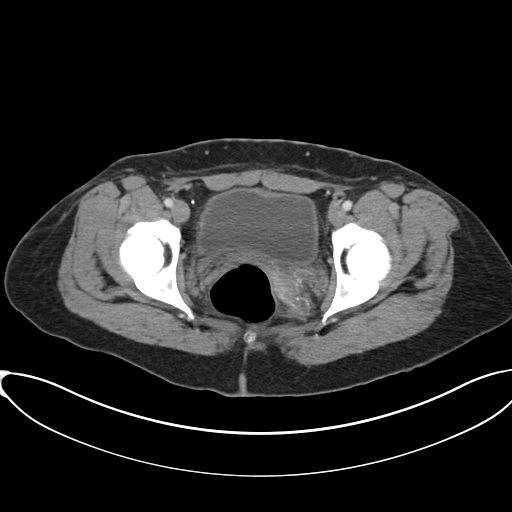
[im 24/86  soft-tissue]
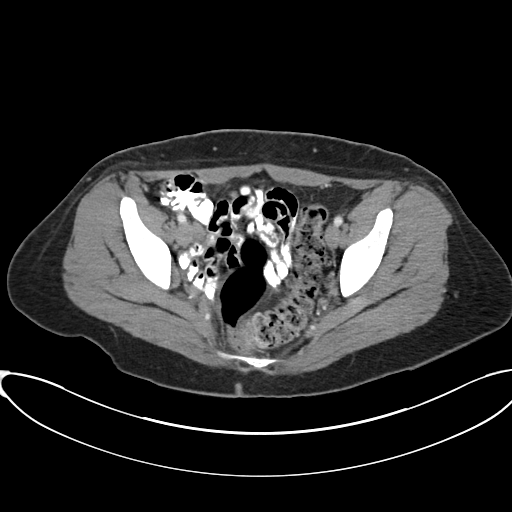
[im 28/86  soft-tissue]
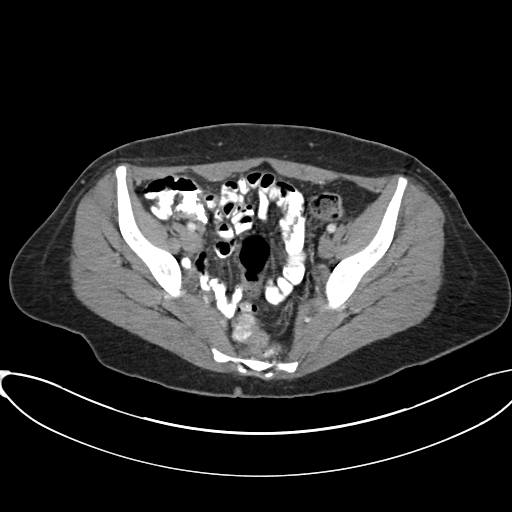
[im 35/86  soft-tissue]
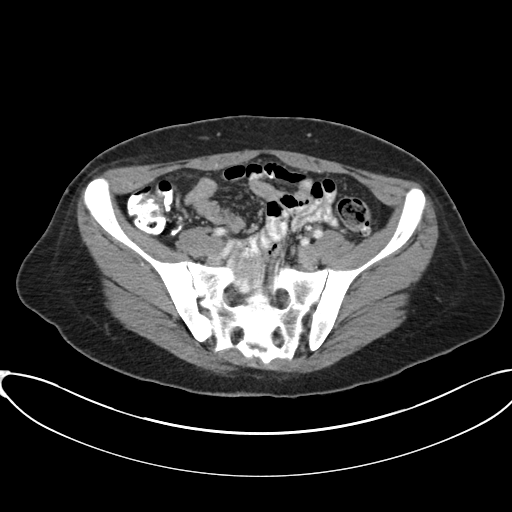
[im 41/86  soft-tissue]
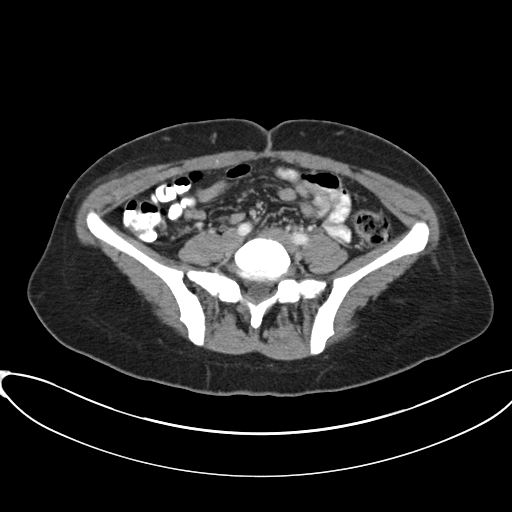
[im 45/86  soft-tissue]
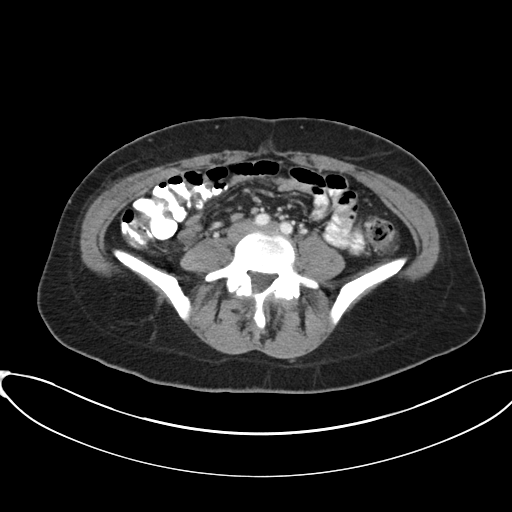
[im 52/86  soft-tissue]
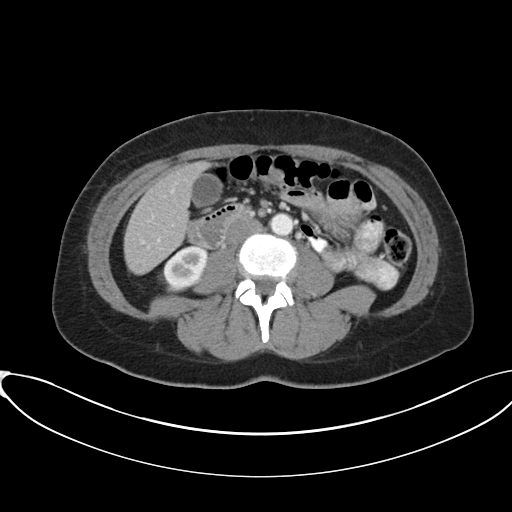
[im 52/86  bone]
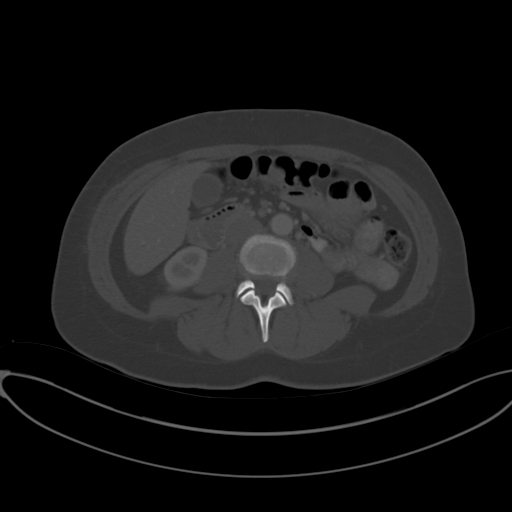
[im 58/86  soft-tissue]
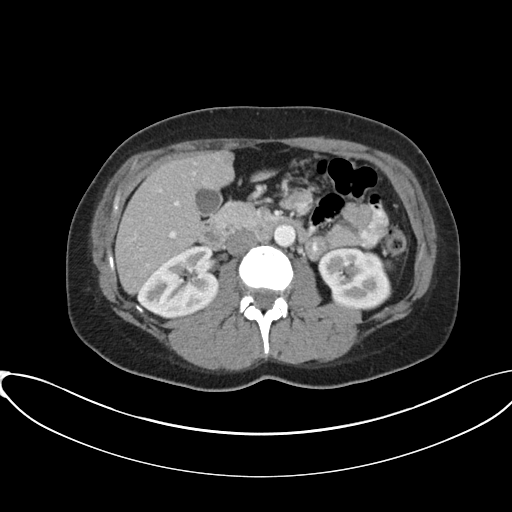
[im 65/86  soft-tissue]
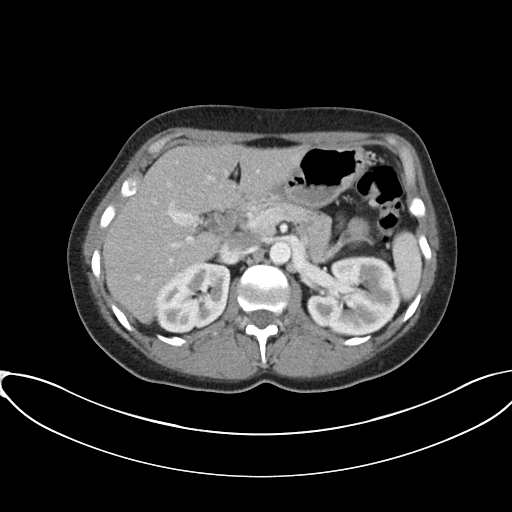
[im 69/86  soft-tissue]
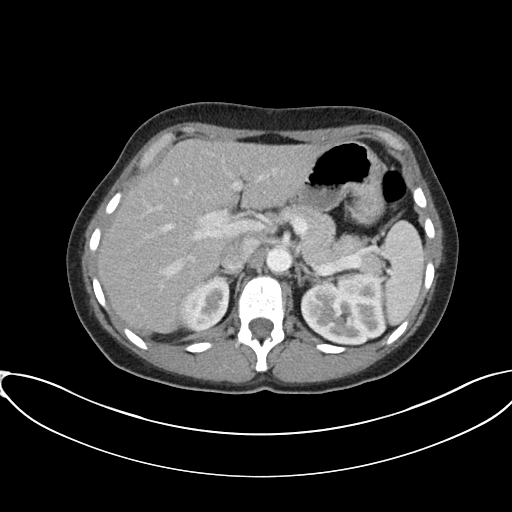
[im 75/86  soft-tissue]
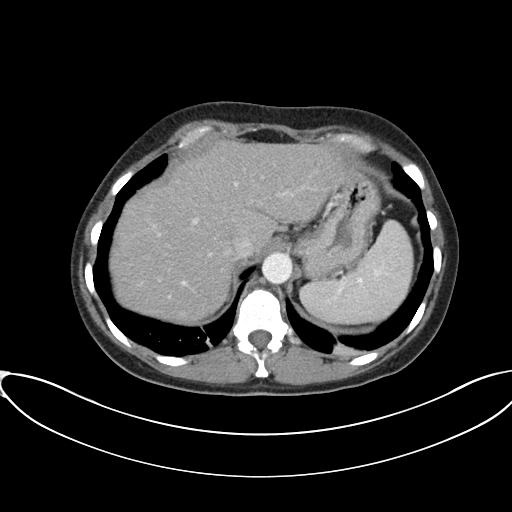
[im 82/86  soft-tissue]
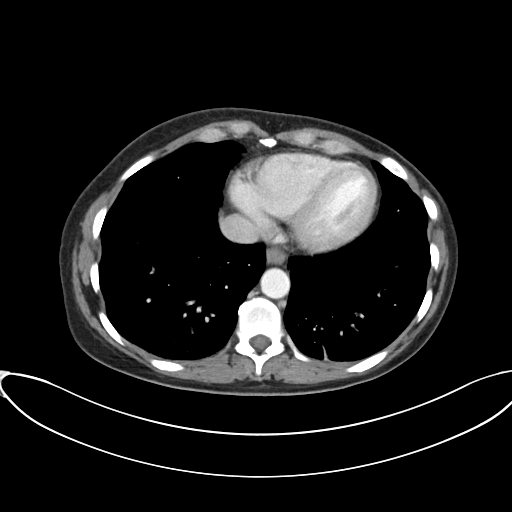

[Series 602: cor · coronal · 0.84mm/px · 3 of 74 slices shown]
[im 25/74  soft-tissue]
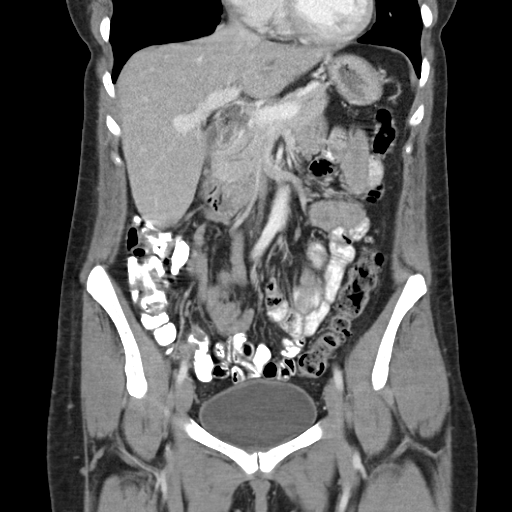
[im 33/74  soft-tissue]
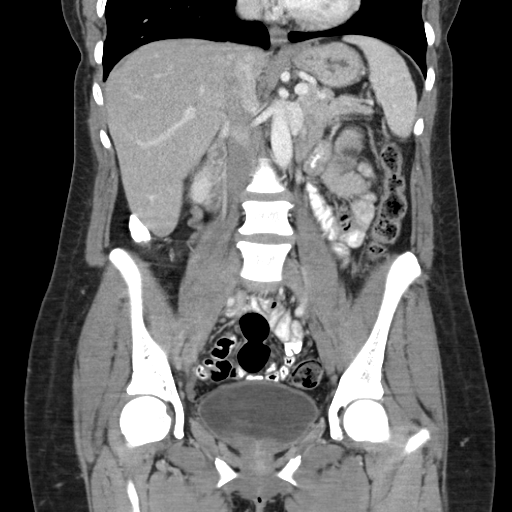
[im 41/74  soft-tissue]
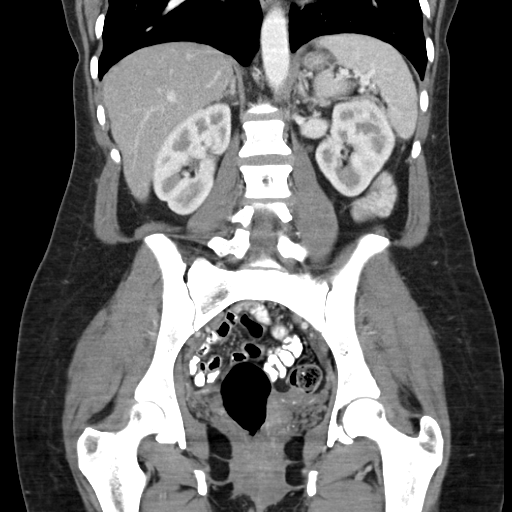

[17 of 46 positions shown; findings below may reference images not displayed]

FINDINGS: Lung bases show mild dependent atelectasis. No infiltrate or
effusion.

Subcentimeter low-density lesions in the right and left lobe liver
most likely due to cysts. Small area of fatty infiltration in the
left lobe liver adjacent to the falciform ligament. No liver mass
lesion. Pancreas and spleen normal. Gallbladder and bile ducts
normal.

Normal kidneys and adrenal glands. No renal mass or obstruction. No
renal calculi. Normal urinary bladder.

Hysterectomy and removal the right ovary for dermoid cyst. No
evidence of residual dermoid cyst.

Negative for bowel obstruction. Mild diverticulosis sigmoid and left
colon. Negative for diverticulitis. Normal appendix

Negative for free fluid.  No mass or adenopathy.
IMPRESSION: No acute abnormality. Postop hysterectomy and right oophorectomy for
dermoid cyst.

Sigmoid and left colon diverticulosis without evidence of acute
diverticulitis.

## 2016-06-20 ENCOUNTER — Encounter: Payer: Self-pay | Admitting: Adult Health

## 2016-06-20 ENCOUNTER — Ambulatory Visit (INDEPENDENT_AMBULATORY_CARE_PROVIDER_SITE_OTHER): Payer: Medicaid Other | Admitting: Adult Health

## 2016-06-20 DIAGNOSIS — J4489 Other specified chronic obstructive pulmonary disease: Secondary | ICD-10-CM

## 2016-06-20 DIAGNOSIS — J449 Chronic obstructive pulmonary disease, unspecified: Secondary | ICD-10-CM | POA: Diagnosis not present

## 2016-06-20 MED ORDER — BECLOMETHASONE DIPROPIONATE 80 MCG/ACT IN AERS: INHALATION_SPRAY | RESPIRATORY_TRACT | 6 refills | Status: DC

## 2016-06-20 NOTE — Progress Notes (Signed)
@Patient  ID: Latasha Parker, female    DOB: 1975/07/26, 41 y.o.   MRN: [id number]  Chief Complaint  Patient presents with  . Follow-up    Asthma     Referring provider: No ref. provider found  HPI: 41 yo female smoker followed for Asthma  Pt is deaf.   TEST  CXR 09/05/13 >> no acute disease PFT 01/21/14 >> borderline obstruction, normal lung volumes/diffusion, no BD response  06/20/2016 Follow up : Asthma  Pt returns for a 2 month follow up  Says she has been doing well with no flare of cough or wheezing  Has cut back on smoking . Smoking 5 cigs /d. She was started on wellbutrin last ov and feels it is helping. . Discussed smoking cessation.  Remains on QVAR Twice daily  . Rare ProAir use.   She has an interpretor to help with visit.     Allergies  Allergen Reactions  . Tramadol Nausea And Vomiting  . Vicodin [Hydrocodone-Acetaminophen] Nausea And Vomiting    Immunization History  Administered Date(s) Administered  . Influenza,inj,Quad PF,36+ Mos 03/20/2014, 03/16/2015, 04/19/2016  . Pneumococcal Polysaccharide-23 08/13/2014    Past Medical History:  Diagnosis Date  . Arthritis   . Asthma   . Cyst of ovary    right  . Deaf   . Hyperthyroidism   . Menorrhagia   . Pelvic pain   . SVD (spontaneous vaginal delivery)     Tobacco History: History  Smoking Status  . Current Every Day Smoker  . Packs/day: 0.50  . Years: 15.00  . Types: Cigarettes  Smokeless Tobacco  . Never Used   Ready to quit: Not Answered Counseling given: Not Answered   Outpatient Encounter Prescriptions as of 06/20/2016  Medication Sig  . ALPRAZolam (XANAX) 0.5 MG tablet Take 1 tablet (0.5 mg total) by mouth 3 (three) times daily as needed.  Marland Kitchen buPROPion (ZYBAN) 150 MG 12 hr tablet Take 1 tablet (150 mg total) by mouth 2 (two) times daily.  Marland Kitchen estradiol (CLIMARA - DOSED IN MG/24 HR) 0.1 mg/24hr patch PLACE 1 PATCH (0.1 MG TOTAL) ONTO THE SKIN ONCE A WEEK.  Marland Kitchen ibuprofen (ADVIL,MOTRIN)  800 MG tablet Take 1 tablet (800 mg total) by mouth every 8 (eight) hours as needed (mild pain).  . Iron TABS Take 1 tablet by mouth every morning.  Marland Kitchen PROAIR HFA 108 (90 BASE) MCG/ACT inhaler INHALE 2 PUFFS INTO THE LUNGS EVERY 6 (SIX) HOURS AS NEEDED FOR WHEEZING OR SHORTNESS OF BREATH.  Marland Kitchen QVAR 80 MCG/ACT inhaler INHALE 2 PUFFS INTO THE LUNGS 2 (TWO) TIMES DAILY.  . naproxen (NAPROSYN) 500 MG tablet Take 1 tablet (500 mg total) by mouth 2 (two) times daily. (Patient not taking: Reported on 06/20/2016)   No facility-administered encounter medications on file as of 06/20/2016.      Review of Systems  Constitutional:   No  weight loss, night sweats,  Fevers, chills, fatigue, or  lassitude.  HEENT:   No headaches,  Difficulty swallowing,  Tooth/dental problems, or  Sore throat,                No sneezing, itching, ear ache, nasal congestion, post nasal drip,   CV:  No chest pain,  Orthopnea, PND, swelling in lower extremities, anasarca, dizziness, palpitations, syncope.   GI  No heartburn, indigestion, abdominal pain, nausea, vomiting, diarrhea, change in bowel habits, loss of appetite, bloody stools.   Resp: No shortness of breath with exertion or at rest.  No excess mucus, no productive cough,  No non-productive cough,  No coughing up of blood.  No change in color of mucus.  No wheezing.  No chest wall deformity  Skin: no rash or lesions.  GU: no dysuria, change in color of urine, no urgency or frequency.  No flank pain, no hematuria   MS:  No joint pain or swelling.  No decreased range of motion.  No back pain.    Physical Exam  BP 118/60   Pulse 64   Temp 98 F (36.7 C) (Oral)   Ht 5' 8.5" (1.74 m)   Wt 133 lb 9.6 oz (60.6 kg)   LMP 06/12/2014   SpO2 96%   BMI 20.02 kg/m   GEN: A/Ox3; pleasant , NAD, well nourished , Deaf    HEENT:  [place]/AT,  EACs-clear, TMs-wnl, NOSE-clear, THROAT-clear, no lesions, no postnasal drip or exudate noted.   NECK:  Supple w/ fair ROM; no JVD;  normal carotid impulses w/o bruits; no thyromegaly or nodules palpated; no lymphadenopathy.    RESP  Clear  P & A; w/o, wheezes/ rales/ or rhonchi. no accessory muscle use, no dullness to percussion  CARD:  RRR, no m/r/g, no peripheral edema, pulses intact, no cyanosis or clubbing.  GI:   Soft & nt; nml bowel sounds; no organomegaly or masses detected.   Musco: Warm bil, no deformities or joint swelling noted.   Neuro: alert, no focal deficits noted.    Skin: Warm, no lesions or rashes  Psych:  No change in mood or affect. No depression or anxiety.  No memory loss.  Lab Results:  CBC    Component Value Date/Time   WBC 9.4 11/06/2014 0950   RBC 5.16 (H) 11/06/2014 0950   HGB 16.2 (H) 11/06/2014 0950   HCT 47.4 (H) 11/06/2014 0950   PLT 415 (H) 11/06/2014 0950   MCV 91.9 11/06/2014 0950   MCH 31.4 11/06/2014 0950   MCHC 34.2 11/06/2014 0950   RDW 14.3 11/06/2014 0950   LYMPHSABS 2.4 11/06/2014 0950   MONOABS 0.6 11/06/2014 0950   EOSABS 0.3 11/06/2014 0950   BASOSABS 0.1 11/06/2014 0950    BMET    Component Value Date/Time   NA 141 11/06/2014 0950   K 3.9 11/06/2014 0950   CL 109 11/06/2014 0950   CO2 25 11/06/2014 0950   GLUCOSE 102 (H) 11/06/2014 0950   BUN <5 (L) 11/06/2014 0950   CREATININE 0.62 11/06/2014 0950   CALCIUM 9.6 11/06/2014 0950   GFRNONAA >60 11/06/2014 0950   GFRAA >60 11/06/2014 0950    BNP No results found for: BNP  ProBNP No results found for: PROBNP  Imaging: No results found.   Assessment & Plan:   COPD (chronic obstructive pulmonary disease) with chronic bronchitis Asthma/COPD well controlled on QVAR  Smoking cessation is key   Plan  Patient Instructions  Most important goal is to quit smoking . -keep up good work on cutting back .  Continue on QVAR 69mcg 2 puffs Twice daily  , rinse after use.  Follow up Dr. Halford Chessman  In 4- 6 months and As needed          Rexene Edison, NP 06/20/2016

## 2016-06-20 NOTE — Patient Instructions (Signed)
Most important goal is to quit smoking . -keep up good work on cutting back .  Continue on QVAR 72mcg 2 puffs Twice daily  , rinse after use.  Follow up Dr. Halford Chessman  In 4- 6 months and As needed

## 2016-06-20 NOTE — Addendum Note (Signed)
Addended by: Doroteo Glassman D on: 06/20/2016 11:49 AM   Modules accepted: Orders

## 2016-06-20 NOTE — Assessment & Plan Note (Signed)
Asthma/COPD well controlled on QVAR  Smoking cessation is key   Plan  Patient Instructions  Most important goal is to quit smoking . -keep up good work on cutting back .  Continue on QVAR 67mcg 2 puffs Twice daily  , rinse after use.  Follow up Dr. Halford Chessman  In 4- 6 months and As needed

## 2016-06-21 NOTE — Progress Notes (Signed)
I have reviewed and agree with assessment/plan.  Chesley Mires, MD [facility] Pulmonary/Critical Care 06/21/2016, 10:08 AM Pager:  [phone]

## 2016-06-23 ENCOUNTER — Other Ambulatory Visit: Payer: Self-pay | Admitting: Pulmonary Disease

## 2016-06-24 ENCOUNTER — Telehealth: Payer: Self-pay | Admitting: Pulmonary Disease

## 2016-06-24 MED ORDER — ALPRAZOLAM 0.5 MG PO TABS: 0.5000 mg | ORAL_TABLET | Freq: Three times a day (TID) | ORAL | 1 refills | Status: DC | PRN

## 2016-06-24 NOTE — Telephone Encounter (Signed)
Okay to refill? 

## 2016-06-24 NOTE — Telephone Encounter (Signed)
Sp[facility] [address], and phoned in [place]. Pt aware and voiced her understanding. Nothing further needed.

## 2016-06-24 NOTE — Telephone Encounter (Signed)
VS pt is calling for a refill of her alprazolam.  This was last filled on 04/19/16 with #90 tablets and 1 refill.  Please advise if ok to call in to the pharmacy. Thanks  Last ov with TP  06/20/16

## 2016-07-18 ENCOUNTER — Telehealth: Payer: Self-pay | Admitting: Acute Care

## 2016-07-18 NOTE — Telephone Encounter (Signed)
[facility] QVAR 85mcg >> QVAR HFA is being discontinued and they are changing all to the new QVAR Redihaler which is the same medication but a different mechanism/device. The new device is very easy to use. If okay to change Rx now, patient will need to come in for demonstration of the new inhaler.   Please advise Dr Halford Chessman that you are okay with making changes to the patient's med list and Rx. Thanks.

## 2016-07-19 MED ORDER — BECLOMETHASONE DIPROP HFA 80 MCG/ACT IN AERB: 2.0000 | INHALATION_SPRAY | Freq: Two times a day (BID) | RESPIRATORY_TRACT | 6 refills | Status: DC

## 2016-07-19 NOTE — Telephone Encounter (Signed)
That is fine to switch

## 2016-07-20 NOTE — Telephone Encounter (Signed)
Change to flovent 44 mcg two puffs bid.

## 2016-07-20 NOTE — Telephone Encounter (Signed)
A call was placed to [employer] at [phone] who stated Qvar Redihaler be covered after try and failure of other inhalers and if patient criteria is met. They stated the covered alternatives are Flovent HFA or Pulmicort Respule in .'25mg'$ , .'5mg'$ , or '1mg'$ ; the patient will also need prior authorization for the chosen medication. Please advise Dr. Halford Chessman.   Interaction ID number: [id number]

## 2016-07-21 NOTE — Telephone Encounter (Signed)
lmtcb x1 for pt to make her aware of medication change.

## 2016-07-27 MED ORDER — FLUTICASONE PROPIONATE HFA 44 MCG/ACT IN AERO: 2.0000 | INHALATION_SPRAY | Freq: Two times a day (BID) | RESPIRATORY_TRACT | 3 refills | Status: DC

## 2016-07-27 NOTE — Telephone Encounter (Signed)
Pt's spouse aware of medication change and voiced his understanding. Rx sent to preferred pharmacy. Nothing further needed.

## 2016-08-17 ENCOUNTER — Telehealth: Payer: Self-pay | Admitting: Pulmonary Disease

## 2016-08-17 MED ORDER — BECLOMETHASONE DIPROP HFA 80 MCG/ACT IN AERB: 2.0000 | INHALATION_SPRAY | Freq: Two times a day (BID) | RESPIRATORY_TRACT | 5 refills | Status: DC

## 2016-08-17 NOTE — Telephone Encounter (Signed)
Pt seen 1.29.18 by TP for follow up: Instructions Return in about 4 months (around 10/18/2016) for Follow up Dr. Halford Chessman.  Most important goal is to quit smoking . -keep up good work on cutting back .  Continue on QVAR 13mcg 2 puffs Twice daily  , rinse after use.  Follow up Dr. Halford Chessman  In 4- 6 months and As needed     Upcoming ov is scheduled with VS for 5.31.18 QVAR was refilled at that visit with 6 additional refills >> called [facility] [address] and spoke with Verdis Frederickson - the [place] needs to be changed to the [place] and this will require PA.  Will go ahead and take care of that Rx so this process can be started.  LMOM TCB x1 for pt inform her of this process.  Alprazolam 0.5mg  was last refilled by VS on 2.2.18 #90 w/ 1 refill VS please advise if this can be refilled, thank you.

## 2016-08-18 MED ORDER — ALPRAZOLAM 0.5 MG PO TABS: 0.5000 mg | ORAL_TABLET | Freq: Three times a day (TID) | ORAL | 0 refills | Status: DC | PRN

## 2016-08-18 MED ORDER — BECLOMETHASONE DIPROP HFA 80 MCG/ACT IN AERB: 2.0000 | INHALATION_SPRAY | Freq: Two times a day (BID) | RESPIRATORY_TRACT | 11 refills | Status: DC

## 2016-08-18 NOTE — Telephone Encounter (Signed)
Interpreter has returned call. Informed her of the Qvar inhaler change and that we are waiting to hear back from Dr. Halford Chessman regarding her Xanax refill. Qvar rx sent to preferred pharmacy. Pt aware.

## 2016-08-18 NOTE — Telephone Encounter (Signed)
Rx for alprazolam refilled  Called the pt but NA and so left a msg with her interpreter to let her know that the rx was called in

## 2016-08-18 NOTE — Telephone Encounter (Signed)
Okay to send refill for xanax. 

## 2016-08-24 ENCOUNTER — Other Ambulatory Visit: Payer: Self-pay | Admitting: *Deleted

## 2016-08-24 ENCOUNTER — Telehealth: Payer: Self-pay | Admitting: Pulmonary Disease

## 2016-08-24 NOTE — Telephone Encounter (Signed)
QVAR redihaler has been rejected by the pts insurance.  Will need an alternative to send in for her.  VS please advise. thanks

## 2016-08-28 NOTE — Telephone Encounter (Signed)
Send flovent 44 mcg two puffs bid.

## 2016-08-29 NOTE — Telephone Encounter (Signed)
LM via sign messaging [phone]

## 2016-09-19 ENCOUNTER — Other Ambulatory Visit: Payer: Self-pay | Admitting: Pulmonary Disease

## 2016-09-19 ENCOUNTER — Telehealth: Payer: Self-pay | Admitting: Pulmonary Disease

## 2016-09-19 MED ORDER — FLUTICASONE PROPIONATE HFA 44 MCG/ACT IN AERO: 2.0000 | INHALATION_SPRAY | Freq: Two times a day (BID) | RESPIRATORY_TRACT | 3 refills | Status: DC

## 2016-09-19 MED ORDER — ALPRAZOLAM 0.5 MG PO TABS: 0.5000 mg | ORAL_TABLET | Freq: Three times a day (TID) | ORAL | 0 refills | Status: DC | PRN

## 2016-09-19 NOTE — Telephone Encounter (Signed)
Spoke with pt, aware of refills.  rx's sent to preferred pharmacy.  Nothing further needed.

## 2016-09-19 NOTE — Telephone Encounter (Signed)
PM  Please Advise in VS absence-   Pt called requesting refill of her Xanax. Informed pt that VS would be unavailable until Wednesday but pt states she ran out of her medication last night. Please advise if ok to refill.  ALPRAZolam (XANAX) 0.5 MG tablet Last filled 08/18/16 Quantity #90  RF: 0 Sig: Take 1 tablet (0.5 mg total) by mouth 3 (three) times daily as needed.

## 2016-09-19 NOTE — Telephone Encounter (Signed)
Ok to give for 30 days with no refills.  PM

## 2016-10-20 ENCOUNTER — Ambulatory Visit (INDEPENDENT_AMBULATORY_CARE_PROVIDER_SITE_OTHER): Payer: Medicaid Other | Admitting: Pulmonary Disease

## 2016-10-20 ENCOUNTER — Other Ambulatory Visit: Payer: Self-pay | Admitting: Pulmonary Disease

## 2016-10-20 ENCOUNTER — Encounter: Payer: Self-pay | Admitting: Pulmonary Disease

## 2016-10-20 VITALS — BP 98/60 | HR 67 | Wt 133.2 lb

## 2016-10-20 DIAGNOSIS — Z720 Tobacco use: Secondary | ICD-10-CM

## 2016-10-20 DIAGNOSIS — J449 Chronic obstructive pulmonary disease, unspecified: Secondary | ICD-10-CM | POA: Diagnosis not present

## 2016-10-20 NOTE — Progress Notes (Signed)
Current Outpatient Prescriptions on File Prior to Visit  Medication Sig  . ALPRAZolam (XANAX) 0.5 MG tablet Take 1 tablet (0.5 mg total) by mouth 3 (three) times daily as needed.  Marland Kitchen buPROPion (ZYBAN) 150 MG 12 hr tablet Take 1 tablet (150 mg total) by mouth 2 (two) times daily.  . fluticasone (FLOVENT HFA) 44 MCG/ACT inhaler Inhale 2 puffs into the lungs 2 (two) times daily.  Marland Kitchen ibuprofen (ADVIL,MOTRIN) 800 MG tablet Take 1 tablet (800 mg total) by mouth every 8 (eight) hours as needed (mild pain).  . naproxen (NAPROSYN) 500 MG tablet Take 1 tablet (500 mg total) by mouth 2 (two) times daily.  Marland Kitchen PROAIR HFA 108 (90 BASE) MCG/ACT inhaler INHALE 2 PUFFS INTO THE LUNGS EVERY 6 (SIX) HOURS AS NEEDED FOR WHEEZING OR SHORTNESS OF BREATH.  . beclomethasone (QVAR) 80 MCG/ACT inhaler INHALE 2 PUFFS INTO THE LUNGS 2 (TWO) TIMES DAILY. (Patient not taking: Reported on 10/20/2016)  . Beclomethasone Diprop HFA (QVAR REDIHALER) 80 MCG/ACT AERB Inhale 2 puffs into the lungs 2 (two) times daily. (Patient not taking: Reported on 10/20/2016)  . Beclomethasone Diprop HFA (QVAR REDIHALER) 80 MCG/ACT AERB Inhale 2 puffs into the lungs 2 (two) times daily. (Patient not taking: Reported on 10/20/2016)  . estradiol (CLIMARA - DOSED IN MG/24 HR) 0.1 mg/24hr patch PLACE 1 PATCH (0.1 MG TOTAL) ONTO THE SKIN ONCE A WEEK. (Patient not taking: Reported on 10/20/2016)  . Iron TABS Take 1 tablet by mouth every morning.   No current facility-administered medications on file prior to visit.      Chief Complaint  Patient presents with  . COPD    pt stated that the flovent HFA helps her so much.   pt wanted to see if rx for the iron pills could be sent in.      Pulmonary tests CXR 09/05/13 >> no acute disease PFT 01/21/14 >> borderline obstruction, normal lung volumes/diffusion, no BD response  Past medical history Deaf, Hyperthyroidism, Anxiety  Past surgical history, Family history, Social history, Allergies reviewed  Vital  signs BP 98/60 (BP Location: Right Arm, Patient Position: Sitting, Cuff Size: Normal)   Pulse 67   Wt 133 lb 4 oz (60.4 kg)   LMP 06/12/2014   SpO2 100%   BMI 19.97 kg/m   History of Present Illness: Latasha Parker is a 41 y.o. female smoker with COPD/chronic bronchitis.  Interview conducted with assistance of sign language interpreter.  She was changed to flovent.  She has been doing well.  Not much cough, wheeze, or sputum.  She is still smoking, but less.  Feels bupropion helps with this and her mood.  She was prescribed iron supplementation by her PCP.  Physical Exam:  General - pleasant Eyes - pupils reactive ENT - no sinus tenderness, no oral exudate, no LAN, wears dentures Cardiac - regular, no murmur Chest - no wheeze, rales Abd - soft, non tender Ext - no edema Skin - no rashes Neuro - normal strength Psych - normal mood   Assessment/Plan:  COPD with chronic bronchitis. - continue flovent and prn proair  Tobacco abuse. - Continue bupropion  Anxiety. - xanax prn  Iron deficiency. - advised her to discuss her PCP about dosing of iron supplementation   Patient Instructions  Follow up in 1 year    Chesley Mires, MD [place] Pulmonary/Critical Care/Sleep Pager:  [phone] 10/20/2016, 3:09 PM

## 2016-10-20 NOTE — Patient Instructions (Signed)
Follow up in 1 year.

## 2016-10-21 ENCOUNTER — Telehealth: Payer: Self-pay | Admitting: Pulmonary Disease

## 2016-10-21 MED ORDER — ALPRAZOLAM 0.5 MG PO TABS: 0.5000 mg | ORAL_TABLET | Freq: Three times a day (TID) | ORAL | 0 refills | Status: DC | PRN

## 2016-10-21 NOTE — Telephone Encounter (Signed)
Ok to refill xanax x one

## 2016-10-21 NOTE — Telephone Encounter (Signed)
Pt is requesting a refill of her xanax.  Was seen by VS yesterday and requested this but it was not sent in.  MW please advise if this can be sent in for the pt.  thanks

## 2016-10-21 NOTE — Telephone Encounter (Signed)
Rx called to pharm x 1 only  LM with interpreter that the rx was called in

## 2016-10-21 NOTE — Telephone Encounter (Signed)
Patient called once again and was advised the message had been put back for this.  She is requesting this be filled today please, as she thought this was going to be sent over yesterday when she was here and needs this today.

## 2016-10-21 NOTE — Telephone Encounter (Signed)
Patient asked that we leave a message if we do not get an answer.

## 2016-11-17 ENCOUNTER — Other Ambulatory Visit: Payer: Self-pay | Admitting: Internal Medicine

## 2016-11-17 ENCOUNTER — Other Ambulatory Visit: Payer: Self-pay | Admitting: Pulmonary Disease

## 2016-11-19 ENCOUNTER — Other Ambulatory Visit: Payer: Self-pay | Admitting: Internal Medicine

## 2016-11-21 ENCOUNTER — Other Ambulatory Visit: Payer: Self-pay | Admitting: Pulmonary Disease

## 2016-11-21 ENCOUNTER — Encounter: Payer: Self-pay | Admitting: Pulmonary Disease

## 2016-11-21 NOTE — Telephone Encounter (Signed)
lmtcb x1 for pt. 

## 2016-11-21 NOTE — Telephone Encounter (Signed)
Sound like using tid,  Not tid prn, and may develop tolerance so rec #45 no refills, try just use it prn pending review by Dr Halford Chessman

## 2016-11-21 NOTE — Telephone Encounter (Signed)
Spoke with pt through an interpreter line (pt speaks ASL), requesting a refill on Xanax 0.5mg .  [facility] [address] Last refill: 10/21/16 #90 with 0 refills, take 1 tab po tid prn. Last ov: 10/20/16  Next ov: none-recall in chart for 09/2017 with VS.  Sending to DOD as VS is off this week.  MW please advise on refill.  Thanks!   5/31/ AVS with VS: Instructions     Return in about 1 year (around 10/20/2017).  Follow up in 1 year

## 2016-11-21 NOTE — Telephone Encounter (Signed)
Pt returning call and can be reached @ [phone].Latasha Parker

## 2016-11-21 NOTE — Telephone Encounter (Signed)
lmtcb X1 for pt  

## 2016-11-22 ENCOUNTER — Other Ambulatory Visit: Payer: Self-pay | Admitting: Pulmonary Disease

## 2016-11-22 MED ORDER — ALPRAZOLAM 0.5 MG PO TABS: 0.5000 mg | ORAL_TABLET | ORAL | 0 refills | Status: DC | PRN

## 2016-11-22 NOTE — Telephone Encounter (Signed)
ERROR, Dr. Halford Chessman please advise on refill.

## 2016-11-22 NOTE — Telephone Encounter (Signed)
Patient returning call - she can be reached at [phone] -pr

## 2016-11-22 NOTE — Telephone Encounter (Signed)
Dr. Melvyn Novas please advise on refill for Alprazolam for pt.

## 2016-11-22 NOTE — Telephone Encounter (Signed)
Pt aware of rec's per Dr Melvyn Novas. Rx called into pharmacy - Xanax 0.5mg  - Take 1 PRN, #45 Pt requesting that Dr Halford Chessman review this and give further recommendations - pt is wanting the the remaining 45 tablets. Pt aware that Dr Halford Chessman is back in office 10/25/16 and I will send this message to him.  Please advise Dr Halford Chessman. Thanks.

## 2016-11-25 ENCOUNTER — Telehealth: Payer: Self-pay | Admitting: Pulmonary Disease

## 2016-11-25 MED ORDER — ALPRAZOLAM 0.5 MG PO TABS: 0.5000 mg | ORAL_TABLET | Freq: Three times a day (TID) | ORAL | 0 refills | Status: DC | PRN

## 2016-11-25 NOTE — Telephone Encounter (Signed)
Pt came into clinic about receiving 90 tablets of Xanax, only received 45..pt contact # [phone] leave a message so it can be translated back to the pt..ert

## 2016-11-25 NOTE — Telephone Encounter (Signed)
Spoke with patient's husband. He stated that when the [facility], [facility] only gave her 30 tablets. Per her chart, it was supposed to be 59.   [facility], they stated that per the RX that was sent in, the insurance only allowed 30 tabs with a refill of 15. The original RX stated that she was taking the medication ONCE daily instead of tid. Informed pharmacist that that was incorrect.  Gave a verbal of 60 tabs, take 3 times daily PRN since VS stated it was ok for her to have 90 tablets. With these RXs, she will have a total of 90 tabs now.

## 2016-11-25 NOTE — Telephone Encounter (Signed)
Called and lmomtcb x 1 for the pt.   

## 2016-11-25 NOTE — Telephone Encounter (Signed)
Can send refill per patient request.

## 2016-11-25 NOTE — Telephone Encounter (Signed)
Spoke with Ronalee Belts. Explained the situation with patient's Xanax. He verbalized understanding. Nothing else needed at time of call.

## 2016-11-25 NOTE — Telephone Encounter (Signed)
Okay to send refill. 

## 2016-12-19 ENCOUNTER — Other Ambulatory Visit: Payer: Self-pay | Admitting: Internal Medicine

## 2016-12-19 ENCOUNTER — Other Ambulatory Visit: Payer: Self-pay | Admitting: Pulmonary Disease

## 2016-12-23 ENCOUNTER — Telehealth: Payer: Self-pay | Admitting: Pulmonary Disease

## 2016-12-23 NOTE — Telephone Encounter (Signed)
Called and spoke to interpreter (sign language). Pt is requesting a refill of Xanax 0.5mg  (sig: 1 tab TID prn). Last filled on 7.6.2018 by VS for #60 with 0 refills.  Pt last seen on 5.31.2018 and advised to f/u in one year.   Dr. Halford Chessman please advise. Thanks.

## 2016-12-25 NOTE — Telephone Encounter (Signed)
Okay to send refill. 

## 2016-12-26 MED ORDER — ALPRAZOLAM 0.5 MG PO TABS: 0.5000 mg | ORAL_TABLET | Freq: Three times a day (TID) | ORAL | 0 refills | Status: DC | PRN

## 2016-12-26 NOTE — Telephone Encounter (Signed)
Called and left a message with the interpreter that the RX has been approved and that I will call the medication into the pharmacy for her.   [facility] [address]

## 2017-01-25 ENCOUNTER — Telehealth: Payer: Self-pay | Admitting: Pulmonary Disease

## 2017-01-25 ENCOUNTER — Other Ambulatory Visit: Payer: Self-pay | Admitting: Pulmonary Disease

## 2017-01-25 MED ORDER — ALPRAZOLAM 0.5 MG PO TABS: 0.5000 mg | ORAL_TABLET | Freq: Three times a day (TID) | ORAL | 0 refills | Status: DC | PRN

## 2017-01-25 NOTE — Telephone Encounter (Signed)
Rx has been called in per VS.

## 2017-01-25 NOTE — Telephone Encounter (Signed)
Refill request of xanax 0.5mg , #90 with 0-RF. Pt last seen at office 10/20/16 and med last refilled 12/26/16 #90 with 0-RF.  Dr. Halford Chessman, please advise if it is okay to refill this med for pt. Thanks!

## 2017-01-25 NOTE — Telephone Encounter (Signed)
Okay to send refill. 

## 2017-01-25 NOTE — Telephone Encounter (Signed)
Pt requesting refill on alprazolam 0.5mg  tab.   Last refill: 12/26/16 #90 with 0 refills- 1 po tid. Per Epic this rx was printed off today as well but not called into pharmacy..printed #90 with 0 refills.  Called pharmacy and it appears that this rx was not called in to pharmacy. [facility] [address]    VS ok to refill rx? Thanks!

## 2017-02-24 ENCOUNTER — Telehealth: Payer: Self-pay | Admitting: Pulmonary Disease

## 2017-02-24 ENCOUNTER — Other Ambulatory Visit: Payer: Self-pay | Admitting: Pulmonary Disease

## 2017-02-24 NOTE — Telephone Encounter (Signed)
Pt was informed that rx for Alprazolam had been sent to pharmacy.-tr

## 2017-02-24 NOTE — Telephone Encounter (Signed)
Spoke with pt regarding

## 2017-02-24 NOTE — Telephone Encounter (Signed)
VS please advise on requested Alprazolam rx. Last refill: Alprazolam 0.5mg  on 01/25/17,#90 with 0 refills, take 1 tab tid prn. Last ov: 10/20/16 Next ov: none-recall for 09/2017 in chart.  VS please advise.  Thanks!

## 2017-02-24 NOTE — Telephone Encounter (Signed)
Called spoke with pt's boyfriend regarding refill on script of xanax. Advised that [facility] and would be ready for pick up. Called spoke with [facility]ail with [facility] calling [facility] for [facility] today. She was able to refill script today and have ready for pick up for the pt today. Nothing further needed at this time.

## 2017-02-27 MED ORDER — VARENICLINE TARTRATE 1 MG PO TABS: 1.0000 mg | ORAL_TABLET | Freq: Two times a day (BID) | ORAL | 2 refills | Status: DC

## 2017-02-27 NOTE — Addendum Note (Signed)
Addended by: Georjean Mode on: 02/27/2017 01:53 PM   Modules accepted: Orders

## 2017-04-26 ENCOUNTER — Telehealth: Payer: Self-pay | Admitting: Pulmonary Disease

## 2017-04-26 NOTE — Telephone Encounter (Signed)
  Called pt to advise her I would send request to VS. Left message to call back.  Please advise on refill.  ALPRAZolam (XANAX) 0.5 MG tablet [[id number]]  Order Details  Dose, Route, Frequency: As Directed Note to Pharmacy:  Not to exceed 5 additional fills before 07/24/2017.    Dispense Quantity: 90 tablet Refills: 0 Fills remaining: --        Sig: TAKE 1 TABLET BY MOUTH THREE TIMES A DAY AS NEEDED       Written Date: 02/24/17 Expiration Date: 08/23/17    Start Date: 02/24/17 End Date: --         Ordering Provider:  -- DEA #:  -- NPI:  --   Authorizing Provider:  Chesley Mires, MD DEA #:  [id number] NPI:  [id number]   Ordering User:  Chesley Mires, MD            Original Order:  ALPRAZolam Duanne Moron) 0.5 MG tablet [[id number]]    [id number] [place] [address] DEA #:  [id number]

## 2017-04-27 MED ORDER — ALPRAZOLAM 0.5 MG PO TABS: 0.5000 mg | ORAL_TABLET | Freq: Three times a day (TID) | ORAL | 2 refills | Status: DC | PRN

## 2017-04-27 NOTE — Telephone Encounter (Signed)
Okay to send script with 2 refills.

## 2017-04-27 NOTE — Telephone Encounter (Signed)
Refill has been called to the pharmacy and left on their VM.  I have called and lmom to make the pt aware.  This was done via relay message.

## 2017-05-23 DIAGNOSIS — N8110 Cystocele, unspecified: Secondary | ICD-10-CM

## 2017-05-23 HISTORY — DX: Cystocele, unspecified: N81.10

## 2017-08-15 ENCOUNTER — Telehealth: Payer: Self-pay | Admitting: *Deleted

## 2017-08-15 NOTE — Telephone Encounter (Signed)
Patient left message that she wants to schedule an appointment for a pap smear but has a question regarding an issue she is having. Please return call.

## 2017-08-15 NOTE — Telephone Encounter (Signed)
Returned patient's call. There was no answer. Voice mail left stating I am returning her call.

## 2017-08-20 ENCOUNTER — Other Ambulatory Visit: Payer: Self-pay | Admitting: Pulmonary Disease

## 2017-08-21 ENCOUNTER — Other Ambulatory Visit: Payer: Self-pay | Admitting: Pulmonary Disease

## 2017-08-21 NOTE — Telephone Encounter (Signed)
Okay to send refill. 

## 2017-08-21 NOTE — Telephone Encounter (Signed)
Patient is requesting Xanax 0.5mg  PO TID PRN. Last Rx was written on 04/27/17 with #90 and 2 refills.   VS please advise.

## 2017-08-21 NOTE — Telephone Encounter (Signed)
Patient returning call regarding refill on alprazolam.  She is out of medication and requesting be refilled today.  [facility] [address]  CB is [phone].

## 2017-08-21 NOTE — Telephone Encounter (Signed)
lmtcb x1 for pt. 

## 2017-08-22 MED ORDER — ALPRAZOLAM 0.5 MG PO TABS: 0.5000 mg | ORAL_TABLET | Freq: Three times a day (TID) | ORAL | 2 refills | Status: DC | PRN

## 2017-08-22 NOTE — Telephone Encounter (Signed)
Rx for Xanax has been called in to [facility] at [address].  Lm for pt to make aware

## 2017-08-23 NOTE — Telephone Encounter (Signed)
Left detailed message for patient that the RX had been called in yesterday. Will close this encounter.

## 2017-08-24 ENCOUNTER — Ambulatory Visit (INDEPENDENT_AMBULATORY_CARE_PROVIDER_SITE_OTHER): Payer: Self-pay | Admitting: Certified Nurse Midwife

## 2017-08-24 ENCOUNTER — Encounter: Payer: Self-pay | Admitting: Certified Nurse Midwife

## 2017-08-24 VITALS — BP 107/60 | HR 76 | Ht 68.0 in | Wt 165.0 lb

## 2017-08-24 DIAGNOSIS — Z01419 Encounter for gynecological examination (general) (routine) without abnormal findings: Secondary | ICD-10-CM

## 2017-08-24 DIAGNOSIS — N8110 Cystocele, unspecified: Secondary | ICD-10-CM

## 2017-08-24 DIAGNOSIS — Z01411 Encounter for gynecological examination (general) (routine) with abnormal findings: Secondary | ICD-10-CM

## 2017-08-24 DIAGNOSIS — Z1231 Encounter for screening mammogram for malignant neoplasm of breast: Secondary | ICD-10-CM

## 2017-08-24 DIAGNOSIS — Z9189 Other specified personal risk factors, not elsewhere classified: Secondary | ICD-10-CM

## 2017-08-24 NOTE — Progress Notes (Signed)
GYNECOLOGY ANNUAL PREVENTATIVE CARE ENCOUNTER NOTE  Subjective:   Latasha Parker is a 42 y.o. G53P2002 female here for a routine annual gynecologic exam.  Current complaints: vaginal bump- she reports when she coughs or has a bowel movement she reports a pop inside her vagina and believes to be a vaginal bump.   Denies abnormal vaginal bleeding, discharge, pelvic pain, problems with intercourse or other gynecologic concerns. She denies change in partner. She denies burning with urination or other s/s of UTI.    Gynecologic History Patient's last menstrual period was 06/12/2014 (exact date). Contraception: none Patient had complete hysterectomy- no PAP needed  Patient never had mammogram.   Obstetric History OB History  Gravida Para Term Preterm AB Living  2 2 2     2   SAB TAB Ectopic Multiple Live Births          2    # Outcome Date GA Lbr Len/2nd Weight Sex Delivery Anes PTL Lv  2 Term 03/02/01    F Vag-Spont   LIV  1 Term 06/10/98    F Vag-Spont   LIV    Past Medical History:  Diagnosis Date  . Arthritis   . Asthma   . Cyst of ovary    right  . Deaf   . Hyperthyroidism   . Menorrhagia   . Pelvic pain   . SVD (spontaneous vaginal delivery)     Past Surgical History:  Procedure Laterality Date  . DIAGNOSTIC LAPAROSCOPY  2003   ovarian cyst removed  . FOOT SURGERY    . LAPAROSCOPIC ASSISTED VAGINAL HYSTERECTOMY N/A 08/12/2014   Procedure: LAPAROSCOPIC ASSISTED VAGINAL HYSTERECTOMY;  Surgeon: Emily Filbert, MD;  Location: [facility];  Service: Gynecology;  Laterality: N/A;  . LAPAROSCOPIC SALPINGO OOPHERECTOMY Right 08/12/2014   Procedure: REMOVE RIGHT SALPINGO OOPHORECTOMY;  Surgeon: Emily Filbert, MD;  Location: [facility];  Service: Gynecology;  Laterality: Right;  . LAPAROSCOPY N/A 07/17/2012   Procedure: LAPAROSCOPY OPERATIVE;  Surgeon: Mora Bellman, MD;  Location: [facility];  Service: Gynecology;  Laterality: N/A;  left oopherectomy and salpingectomy  . MULTIPLE EXTRACTIONS  WITH ALVEOLOPLASTY N/A 06/06/2014   Procedure: MULTIPLE EXTRACTIONS WITH ALVEOLOPLASTY/REMOVAL OF TORI;  Surgeon: Gae Bon, DDS;  Location: [facility];  Service: Oral Surgery;  Laterality: N/A;  . ovary tumor     cyst  . WISDOM TOOTH EXTRACTION      Current Outpatient Medications on File Prior to Visit  Medication Sig Dispense Refill  . ALPRAZolam (XANAX) 0.5 MG tablet Take 1 tablet (0.5 mg total) by mouth 3 (three) times daily as needed. 90 tablet 2  . fluticasone (FLOVENT HFA) 44 MCG/ACT inhaler Inhale 2 puffs into the lungs 2 (two) times daily. 3 Inhaler 3  . Iron TABS Take 1 tablet by mouth every morning.    . varenicline (CHANTIX) 1 MG tablet Take 1 tablet (1 mg total) by mouth 2 (two) times daily. 56 tablet 2   No current facility-administered medications on file prior to visit.     Allergies  Allergen Reactions  . Tramadol Nausea And Vomiting  . Vicodin [Hydrocodone-Acetaminophen] Nausea And Vomiting    Social History   Socioeconomic History  . Marital status: Divorced    Spouse name: Not on file  . Number of children: Not on file  . Years of education: Not on file  . Highest education level: Not on file  Occupational History  . Not on file  Social Needs  . Financial  resource strain: Not on file  . Food insecurity:    Worry: Not on file    Inability: Not on file  . Transportation needs:    Medical: Not on file    Non-medical: Not on file  Tobacco Use  . Smoking status: Current Every Day Smoker    Packs/day: 0.50    Years: 15.00    Pack years: 7.50    Types: Cigarettes  . Smokeless tobacco: Never Used  Substance and Sexual Activity  . Alcohol use: No  . Drug use: No  . Sexual activity: Not Currently    Birth control/protection: None  Lifestyle  . Physical activity:    Days per week: Not on file    Minutes per session: Not on file  . Stress: Not on file  Relationships  . Social connections:    Talks on phone: Not on file    Gets together: Not on file      Attends religious service: Not on file    Active member of club or organization: Not on file    Attends meetings of clubs or organizations: Not on file    Relationship status: Not on file  . Intimate partner violence:    Fear of current or ex partner: Not on file    Emotionally abused: Not on file    Physically abused: Not on file    Forced sexual activity: Not on file  Other Topics Concern  . Not on file  Social History Narrative  . Not on file    Family History  Problem Relation Age of Onset  . Cancer Mother   . Cancer Maternal Grandmother   . Cancer Father     The following portions of the patient's history were reviewed and updated as appropriate: allergies, current medications, past family history, past medical history, past social history, past surgical history and problem list.  Review of Systems Pertinent items noted in HPI and remainder of comprehensive ROS otherwise negative.   Objective:  BP 107/60   Pulse 76   Ht 5\' 8"  (1.727 m)   Wt 165 lb (74.8 kg)   LMP 06/12/2014 (Exact Date)   BMI 25.09 kg/m  CONSTITUTIONAL: Well-developed, well-nourished female in no acute distress.  HENT:  Normocephalic, atraumatic EYES: Conjunctivae and EOM are normal. Pupils are equal, round, and reactive to light. Marland Kitchen  NECK: Normal range of motion, supple, no masses.  Normal thyroid.  SKIN: Skin is warm and dry. No rash noted. Not diaphoretic. No erythema. No pallor. NEUROLOGIC: Alert and oriented to person, place, and time. Normal reflexes, muscle tone coordination.  PSYCHIATRIC: Normal mood and affect. Normal behavior. Normal judgment and thought content. CARDIOVASCULAR: Normal heart rate noted, regular rhythm RESPIRATORY: Clear to auscultation bilaterally. Effort and breath sounds normal, no problems with respiration noted. BREASTS: Symmetric in size. No masses, skin changes, nipple drainage, or lymphadenopathy. ABDOMEN: Soft, normal bowel sounds, no distention noted.  No  tenderness, rebound or guarding.  PELVIC: Normal appearing external genitalia; normal appearing vaginal mucosa and cervix.  No abnormal discharge noted. Vaginal cuff intact from hysterectomy. On valsalva maneuver cystocele present.  MUSCULOSKELETAL: Normal range of motion. No tenderness.  No cyanosis, clubbing, or edema.  2+ distal pulses.   Assessment and Plan:  1. Encounter for annual routine gynecological examination Normal well woman exam other than finding of cystocele.   2. Female cystocele -Patient reports symptoms began to occur 2 weeks ago. She reports when she coughs or has a bowel movement she reports  a pop inside her vagina.  -Educated on cystocele and discussed kegal exercises to build up pelvic floor strength. Discussed follow up in 1 month if cystocele continues to be bothersome.   3. Visit for screening mammogram - MM Digital Screening; Future  4. Has difficulty accessing primary care provider for most visits - Ambulatory referral to [facility]  Will follow up results of pap smear and manage accordingly. Mammogram scheduled Routine preventative health maintenance measures emphasized. Please refer to After Visit Summary for other counseling recommendations.    Lajean Manes, CNM 08/24/17, 4:57 PM

## 2017-08-24 NOTE — Progress Notes (Signed)
Patient has elevated gad7- declines seeing Jamie. States she already has someone she sees.

## 2017-08-24 NOTE — Patient Instructions (Signed)
Pelvic Organ Prolapse Pelvic organ prolapse is the stretching, bulging, or dropping of pelvic organs into an abnormal position. It happens when the muscles and tissues that surround and support pelvic structures are stretched or weak. Pelvic organ prolapse can involve:  Vagina (vaginal prolapse).  Uterus (uterine prolapse).  Bladder (cystocele).  Rectum (rectocele).  Intestines (enterocele).  When organs other than the vagina are involved, they often bulge into the vagina or protrude from the vagina, depending on how severe the prolapse is. What are the causes? Causes of this condition include:  Pregnancy, labor, and childbirth.  Long-lasting (chronic) cough.  Chronic constipation.  Obesity.  Past pelvic surgery.  Aging. During and after menopause, a decreased production of the hormone estrogen can weaken pelvic ligaments and muscles.  Consistently lifting more than 50 lb (23 kg).  Buildup of fluid in the abdomen due to certain diseases and other conditions.  What are the signs or symptoms? Symptoms of this condition include:  Loss of bladder control when you cough, sneeze, strain, and exercise (stress incontinence). This may be worse immediately following childbirth, and it may gradually improve over time.  Feeling pressure in your pelvis or vagina. This pressure may increase when you cough or when you are having a bowel movement.  A bulge that protrudes from the opening of your vagina or against your vaginal wall. If your uterus protrudes through the opening of your vagina and rubs against your clothing, you may also experience soreness, ulcers, infection, pain, and bleeding.  Increased effort to have a bowel movement or urinate.  Pain in your low back.  Pain, discomfort, or disinterest in sexual intercourse.  Repeated bladder infections (urinary tract infections).  Difficulty inserting or inability to insert a tampon or applicator.  In some people, this  condition does not cause any symptoms. How is this diagnosed? Your health care provider may perform an internal and external vaginal and rectal exam. During the exam, you may be asked to cough and strain while you are lying down, sitting, and standing up. Your health care provider will determine if other tests are required, such as bladder function tests. How is this treated? In most cases, this condition needs to be treated only if it produces symptoms. No treatment is guaranteed to correct the prolapse or relieve the symptoms completely. Treatment may include:  Lifestyle changes, such as: ? Avoiding drinking beverages that contain caffeine. ? Increasing your intake of high-fiber foods. This can help to decrease constipation and straining during bowel movements. ? Emptying your bladder at scheduled times (bladder training therapy). This can help to reduce or avoid urinary incontinence. ? Losing weight if you are overweight or obese.  Estrogen. Estrogen may help mild prolapse by increasing the strength and tone of pelvic floor muscles.  Kegel exercises. These may help mild cases of prolapse by strengthening and tightening the muscles of the pelvic floor.  Pessary insertion. A pessary is a soft, flexible device that is placed into your vagina by your health care provider to help support the vaginal walls and keep pelvic organs in place.  Surgery. This is often the only form of treatment for severe prolapse. Different types of surgeries are available.  Follow these instructions at home:  Wear a sanitary pad or absorbent product if you have urinary incontinence.  Avoid heavy lifting and straining with exercise and work. Do not hold your breath when you perform mild to moderate lifting and exercise activities. Limit your activities as directed by your health care   provider.  Take medicines only as directed by your health care provider.  Perform Kegel exercises as directed by your health care  provider.  If you have a pessary, take care of it as directed by your health care provider. Contact a health care provider if:  Your symptoms interfere with your daily activities or sex life.  You need medicine to help with the discomfort.  You notice bleeding from the vagina that is not related to your period.  You have a fever.  You have pain or bleeding when you urinate.  You have bleeding when you have a bowel movement.  You lose urine when you have sex.  You have chronic constipation.  You have a pessary that falls out.  You have vaginal discharge that has a bad smell.  You have low abdominal pain or cramping that is unusual for you. This information is not intended to replace advice given to you by your health care provider. Make sure you discuss any questions you have with your health care provider. Document Released: 12/04/2013 Document Revised: 10/15/2015 Document Reviewed: 07/22/2013 Elsevier Interactive Patient Education  2018 [employer]vier Inc.  

## 2017-09-20 ENCOUNTER — Ambulatory Visit: Payer: Medicaid Other | Admitting: Obstetrics & Gynecology

## 2017-09-20 VITALS — BP 108/67 | HR 91 | Wt 157.1 lb

## 2017-09-20 DIAGNOSIS — N993 Prolapse of vaginal vault after hysterectomy: Secondary | ICD-10-CM

## 2017-09-20 DIAGNOSIS — Z0000 Encounter for general adult medical examination without abnormal findings: Secondary | ICD-10-CM

## 2017-09-20 DIAGNOSIS — N3946 Mixed incontinence: Secondary | ICD-10-CM | POA: Diagnosis not present

## 2017-09-20 NOTE — Progress Notes (Signed)
Pt. Has an appt w/ Dr.Aurty for Urogynecology  In June.

## 2017-09-20 NOTE — Progress Notes (Signed)
   Subjective:    Patient ID: Latasha Parker, female    DOB: 1976-03-29, 42 y.o.   MRN: [id number]  HPI 42 yo single P2 here because of a new onset bulge in her vagina along with new onset mixed urinary incontinence. This started about a month ago. She had a LAVH/RSO due to chronic pelvic pain and a right dermoid cyst.    Review of Systems She has gained about 30 pounds in the last few years.    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 She would like a referral to a specialist in pelvic reconstruction

## 2017-09-21 LAB — CBC
HEMOGLOBIN: 13.8 g/dL (ref 11.1–15.9)
Hematocrit: 41 % (ref 34.0–46.6)
MCH: 31.1 pg (ref 26.6–33.0)
MCHC: 33.7 g/dL (ref 31.5–35.7)
MCV: 92 fL (ref 79–97)
Platelets: 480 10*3/uL — ABNORMAL HIGH (ref 150–379)
RBC: 4.44 x10E6/uL (ref 3.77–5.28)
RDW: 15 % (ref 12.3–15.4)
WBC: 10 10*3/uL (ref 3.4–10.8)

## 2017-09-26 ENCOUNTER — Ambulatory Visit
Admission: RE | Admit: 2017-09-26 | Discharge: 2017-09-26 | Disposition: A | Discharge status: Home / Self Care | Payer: Medicaid Other | Source: Ambulatory Visit | Attending: Certified Nurse Midwife | Admitting: Certified Nurse Midwife

## 2017-09-26 DIAGNOSIS — Z1231 Encounter for screening mammogram for malignant neoplasm of breast: Secondary | ICD-10-CM

## 2017-10-19 ENCOUNTER — Other Ambulatory Visit: Payer: Self-pay | Admitting: Pulmonary Disease

## 2017-10-23 ENCOUNTER — Encounter: Payer: Self-pay | Admitting: Pulmonary Disease

## 2017-10-23 ENCOUNTER — Ambulatory Visit: Payer: Medicaid Other | Admitting: Pulmonary Disease

## 2017-10-23 VITALS — BP 112/64 | HR 79 | Ht 68.0 in | Wt 166.4 lb

## 2017-10-23 DIAGNOSIS — Z720 Tobacco use: Secondary | ICD-10-CM

## 2017-10-23 DIAGNOSIS — J449 Chronic obstructive pulmonary disease, unspecified: Secondary | ICD-10-CM | POA: Diagnosis not present

## 2017-10-23 MED ORDER — ALPRAZOLAM 0.5 MG PO TABS: 0.5000 mg | ORAL_TABLET | Freq: Three times a day (TID) | ORAL | 2 refills | Status: DC | PRN

## 2017-10-23 NOTE — Progress Notes (Signed)
[facility]  Chief Complaint  Patient presents with  . Follow-up    Pt is doing well overall.    Vital signs: BP 112/64 (BP Location: Left Arm, Cuff Size: Normal)   Pulse 79   Ht 5\' 8"  (1.727 m)   Wt 166 lb 6.4 oz (75.5 kg)   LMP 06/12/2014 (Exact Date)   SpO2 96%   BMI 25.30 kg/m   History of Present Illness: Latasha Parker is a 42 y.o. female smoker with COPD/chronic bronchitis.  She is here with sign language interpretor.  She is down to 5 cigarettes per day.  She isn't having cough, wheeze, sputum, chest pain.  She gets occasional leg swelling when she stands too long.  Goes away when she keeps her legs up.  Gets episodes of anxiety.  Uses xanax and this helps.  Her breathing gets worse when she gets anxious.  Physical Exam:  General - pleasant Eyes - pupils reactive ENT - no sinus tenderness, no oral exudate, no LAN Cardiac - regular, no murmur Chest - no wheeze, rales Abd - soft, non tender Ext - no edema Skin - no rashes Neuro - normal strength Psych - normal mood   Assessment/Plan:  COPD with chronic bronchitis. - continue flovent  Tobacco abuse. - she is slowly trying to quit on her own  Anxiety. - prn xanax   Patient Instructions  Follow up in 1 year    Chesley Mires, MD [place] 10/23/2017, 11:26 AM  Flow Sheet  Pulmonary tests: PFT 01/21/14 >> borderline obstruction, normal lung volumes/diffusion, no BD response  Past Medical History: She  has a past medical history of Arthritis, Asthma, Cyst of ovary, Deaf, Hyperthyroidism, Menorrhagia, Pelvic pain, and SVD (spontaneous vaginal delivery).  Past Surgical History: She  has a past surgical history that includes ovary tumor; Foot surgery; Diagnostic laparoscopy (2003); Wisdom tooth extraction; laparoscopy (N/A, 07/17/2012); Multiple extractions with alveoloplasty (N/A, 06/06/2014); Laparoscopic assisted vaginal hysterectomy  (N/A, 08/12/2014); and Laparoscopic salpingo oophorectomy (Right, 08/12/2014).  Family History: Her family history includes Breast cancer in her maternal grandmother and paternal aunt; Cancer in her father, maternal grandmother, and mother.  Social History: She  reports that she has been smoking cigarettes.  She has a 7.50 pack-year smoking history. She has never used smokeless tobacco. She reports that she does not drink alcohol or use drugs.  Medications: Allergies as of 10/23/2017      Reactions   Tramadol Nausea And Vomiting   Vicodin [hydrocodone-acetaminophen] Nausea And Vomiting      Medication List        Accurate as of 10/23/17 11:26 AM. Always use your most recent med list.          ALPRAZolam 0.5 MG tablet Commonly known as:  XANAX Take 1 tablet (0.5 mg total) by mouth 3 (three) times daily as needed.   FLOVENT HFA 44 MCG/ACT inhaler Generic drug:  fluticasone INHALE 2 PUFFS BY MOUTH TWICE A DAY   Iron Tabs Take 1 tablet by mouth every morning.

## 2017-10-23 NOTE — Patient Instructions (Signed)
Follow up in 1 year.

## 2017-10-27 ENCOUNTER — Telehealth: Payer: Self-pay

## 2017-10-27 NOTE — Telephone Encounter (Signed)
Patient called requesting lab results and the phone number to the bladder doctor she was told to call.

## 2017-10-27 NOTE — Telephone Encounter (Signed)
Called patient with relay interpreter, no answer- left message stating we are trying to reach you to return your phone call, please call us back if you still need assistance.

## 2017-10-31 ENCOUNTER — Telehealth: Payer: Self-pay | Admitting: General Practice

## 2017-10-31 NOTE — Telephone Encounter (Signed)
Patient called and left message on nurse voicemail line via video relay requesting a call back.

## 2017-10-31 NOTE — Telephone Encounter (Signed)
Called patient and was connected with video relay interpreter, no answer- left message to call us back if you still need assistance.

## 2017-11-19 ENCOUNTER — Encounter (HOSPITAL_COMMUNITY): Payer: Self-pay | Admitting: Emergency Medicine

## 2017-11-19 ENCOUNTER — Emergency Department (HOSPITAL_COMMUNITY)
Admission: EM | Admit: 2017-11-19 | Discharge: 2017-11-19 | Disposition: A | Discharge status: Home / Self Care | Payer: Medicaid Other | Attending: Emergency Medicine | Admitting: Emergency Medicine

## 2017-11-19 ENCOUNTER — Emergency Department (HOSPITAL_COMMUNITY): Payer: Medicaid Other

## 2017-11-19 DIAGNOSIS — J449 Chronic obstructive pulmonary disease, unspecified: Secondary | ICD-10-CM | POA: Insufficient documentation

## 2017-11-19 DIAGNOSIS — F17210 Nicotine dependence, cigarettes, uncomplicated: Secondary | ICD-10-CM | POA: Insufficient documentation

## 2017-11-19 DIAGNOSIS — R1030 Lower abdominal pain, unspecified: Secondary | ICD-10-CM | POA: Diagnosis present

## 2017-11-19 DIAGNOSIS — K625 Hemorrhage of anus and rectum: Secondary | ICD-10-CM | POA: Insufficient documentation

## 2017-11-19 LAB — URINALYSIS, ROUTINE W REFLEX MICROSCOPIC
BILIRUBIN URINE: NEGATIVE
Glucose, UA: NEGATIVE mg/dL
Ketones, ur: NEGATIVE mg/dL
Leukocytes, UA: NEGATIVE
Nitrite: NEGATIVE
Protein, ur: NEGATIVE mg/dL
Specific Gravity, Urine: 1.008 (ref 1.005–1.030)
pH: 6 (ref 5.0–8.0)

## 2017-11-19 LAB — COMPREHENSIVE METABOLIC PANEL
ALK PHOS: 87 U/L (ref 38–126)
ALT: 19 U/L (ref 0–44)
AST: 23 U/L (ref 15–41)
Albumin: 3.7 g/dL (ref 3.5–5.0)
Anion gap: 8 (ref 5–15)
BUN: 7 mg/dL (ref 6–20)
CALCIUM: 8.9 mg/dL (ref 8.9–10.3)
CHLORIDE: 106 mmol/L (ref 98–111)
CO2: 25 mmol/L (ref 22–32)
CREATININE: 0.7 mg/dL (ref 0.44–1.00)
GFR calc Af Amer: >60 mL/min (ref >60)
GFR calc non Af Amer: >60 mL/min (ref >60)
Glucose, Bld: 85 mg/dL (ref 70–99)
Potassium: 3.6 mmol/L (ref 3.5–5.1)
Sodium: 139 mmol/L (ref 135–145)
Total Bilirubin: 0.3 mg/dL (ref 0.3–1.2)
Total Protein: 6.4 g/dL — ABNORMAL LOW (ref 6.5–8.1)

## 2017-11-19 LAB — CBC
HCT: 40.7 % (ref 36.0–46.0)
Hemoglobin: 13.1 g/dL (ref 12.0–15.0)
MCH: 30.7 pg (ref 26.0–34.0)
MCHC: 32.2 g/dL (ref 30.0–36.0)
MCV: 95.3 fL (ref 78.0–100.0)
PLATELETS: 451 10*3/uL — AB (ref 150–400)
RBC: 4.27 MIL/uL (ref 3.87–5.11)
RDW: 14.3 % (ref 11.5–15.5)
WBC: 9.2 10*3/uL (ref 4.0–10.5)

## 2017-11-19 MED ORDER — SODIUM CHLORIDE 0.9 % IV BOLUS
1000.0000 mL | Freq: Once | INTRAVENOUS | Status: AC
  Administered 2017-11-19: 1000 mL via INTRAVENOUS

## 2017-11-19 MED ORDER — DICYCLOMINE HCL 20 MG PO TABS: 20.0000 mg | ORAL_TABLET | Freq: Two times a day (BID) | ORAL | 0 refills | Status: DC

## 2017-11-19 MED ORDER — IOHEXOL 300 MG/ML  SOLN
100.0000 mL | Freq: Once | INTRAMUSCULAR | Status: AC | PRN
  Administered 2017-11-19: 100 mL via INTRAVENOUS

## 2017-11-19 NOTE — Discharge Instructions (Addendum)
Please see the information and instructions below regarding your visit.  Your diagnoses today include:  1. Rectal bleeding     Tests performed today include: See side panel of your discharge paperwork for testing performed today. Vital signs are listed at the bottom of these instructions.   Your CT scan did not demonstrate any evidence of diverticulitis, or other concerning features that would cause the bleeding.  Medications prescribed:    Take any prescribed medications only as prescribed, and any over the counter medications only as directed on the packaging.  You may take Bentyl up to 2 times daily for abdominal cramping.  Home care instructions:  Please follow any educational materials contained in this packet.   Follow-up instructions:  Please follow up with Dr. Hilarie Fredrickson this week.  Return instructions:  Please return to the Emergency Department if you experience worsening symptoms. Please return to the emergency department if you develop any sudden increase in the bleeding, bleeding from your rectum without bowel movements, dizziness, lightheadedness, chest pain, or shortness of breath associated with your rectal bleeding. Please return if you have any other emergent concerns.  Additional Information: Please continue the MiraLAX if you do experience constipation.  As long as you are having diarrhea, please stop the MiraLAX.  Your vital signs today were: BP 103/70    Pulse (!) 56    Temp 98.5 F (36.9 C) (Oral)    Resp (!) 23    LMP 06/12/2014 (Exact Date)    SpO2 97%  If your blood pressure (BP) was elevated on multiple readings during this visit above 130 for the top number or above 80 for the bottom number, please have this repeated by your primary care provider within one month. --------------  Thank you for allowing [place] to participate in your care today.

## 2017-11-19 NOTE — ED Triage Notes (Signed)
ASL interpreter called.

## 2017-11-19 NOTE — ED Provider Notes (Signed)
[facility] EMERGENCY DEPARTMENT Provider Note   CSN: [id number] Arrival date & time: 11/19/17  1052     History   Chief Complaint Chief Complaint  Patient presents with  . Abdominal Pain  . Rectal Bleeding    HPI Latasha Parker is a 42 y.o. female.  HPI  Patient is a 42 year old female with a history of arthritis, COPD, right ovarian cyst, Ray, and bladder prolapse presenting for rectal bleeding.  Patient reports that in the past 24 hours, she has had 3 episodes of stool with blood mixed in.  Patient reports that she has had some left-sided crampy lower abdominal pain, but otherwise has had no pain in the rectum with bowel movements.  Patient reports she is typically more constipated with 1-2 BMs per week, however she has had loose stools with the blood.  Patient denies any fevers, chills, pelvic pain, dysuria, urgency, frequency, or vaginal bleeding.  Patient does not take blood thinning medications.  History obtained with ASL interpreter physically present, [place] interpreter.  Past Medical History:  Diagnosis Date  . Arthritis   . Asthma   . Cyst of ovary    right  . Deaf   . Hyperthyroidism   . Menorrhagia   . Pelvic pain   . SVD (spontaneous vaginal delivery)     Patient Active Problem List   Diagnosis Date Noted  . Post-operative state 08/12/2014  . Dermoid cyst 06/19/2014  . Health care maintenance 10/09/2013  . Right knee pain 10/09/2013  . Insomnia 09/09/2013  . COPD (chronic obstructive pulmonary disease) with chronic bronchitis ([place]) 09/09/2013  . Tobacco abuse 09/09/2013  . Endometriosis 11/27/2012  . Menorrhagia 01/13/2012  . Dysmenorrhea 01/13/2012  . Back pain 01/13/2012  . Ovarian cyst 01/13/2012    Past Surgical History:  Procedure Laterality Date  . DIAGNOSTIC LAPAROSCOPY  2003   ovarian cyst removed  . FOOT SURGERY    . LAPAROSCOPIC ASSISTED VAGINAL HYSTERECTOMY N/A 08/12/2014   Procedure: LAPAROSCOPIC ASSISTED  VAGINAL HYSTERECTOMY;  Surgeon: Emily Filbert, MD;  Location: [facility];  Service: Gynecology;  Laterality: N/A;  . LAPAROSCOPIC SALPINGO OOPHERECTOMY Right 08/12/2014   Procedure: REMOVE RIGHT SALPINGO OOPHORECTOMY;  Surgeon: Emily Filbert, MD;  Location: [facility];  Service: Gynecology;  Laterality: Right;  . LAPAROSCOPY N/A 07/17/2012   Procedure: LAPAROSCOPY OPERATIVE;  Surgeon: Mora Bellman, MD;  Location: [facility];  Service: Gynecology;  Laterality: N/A;  left oopherectomy and salpingectomy  . MULTIPLE EXTRACTIONS WITH ALVEOLOPLASTY N/A 06/06/2014   Procedure: MULTIPLE EXTRACTIONS WITH ALVEOLOPLASTY/REMOVAL OF TORI;  Surgeon: Gae Bon, DDS;  Location: [facility];  Service: Oral Surgery;  Laterality: N/A;  . ovary tumor     cyst  . WISDOM TOOTH EXTRACTION       OB History    Gravida  2   Para  2   Term  2   Preterm      AB      Living  2     SAB      TAB      Ectopic      Multiple      Live Births  2            Home Medications    Prior to Admission medications   Medication Sig Start Date End Date Taking? Authorizing Provider  ALPRAZolam Duanne Moron) 0.5 MG tablet Take 1 tablet (0.5 mg total) by mouth 3 (three) times daily as needed. Patient taking differently: Take 0.5  mg by mouth at bedtime.  10/23/17  Yes Chesley Mires, MD  FLOVENT HFA 44 MCG/ACT inhaler INHALE 2 PUFFS BY MOUTH TWICE A DAY Patient taking differently: INHALE 2 PUFFS BY MOUTH TWICE A DAY AS NEEDED FOR SHORTNESS OF BREATH/WHEEZING 10/20/17  Yes Chesley Mires, MD  IRON PO Take 1 tablet by mouth daily.   Yes [provider]  Multiple Vitamin (MULTIVITAMIN WITH MINERALS) TABS tablet Take 1 tablet by mouth daily.   Yes [provider]    Family History Family History  Problem Relation Age of Onset  . Cancer Mother   . Cancer Maternal Grandmother   . Breast cancer Maternal Grandmother   . Cancer Father   . Breast cancer Paternal Aunt     Social History Social History   Tobacco Use  .  Smoking status: Current Every Day Smoker    Packs/day: 0.50    Years: 15.00    Pack years: 7.50    Types: Cigarettes  . Smokeless tobacco: Never Used  Substance Use Topics  . Alcohol use: No  . Drug use: No     Allergies   Tramadol and Vicodin [hydrocodone-acetaminophen]   Review of Systems Review of Systems  Constitutional: Negative for chills and fever.  Respiratory: Negative for choking, shortness of breath and wheezing.   Cardiovascular: Negative for chest pain and palpitations.  Gastrointestinal: Positive for abdominal pain, anal bleeding, blood in stool and diarrhea. Negative for nausea and vomiting.  Genitourinary: Negative for dysuria, frequency, pelvic pain and urgency.  All other systems reviewed and are negative.    Physical Exam Updated Vital Signs BP 103/70   Pulse (!) 56   Temp 98.5 F (36.9 C) (Oral)   Resp (!) 23   LMP 06/12/2014 (Exact Date)   SpO2 97%   Physical Exam  Constitutional: She appears well-developed and well-nourished. No distress.  HENT:  Head: Normocephalic and atraumatic.  Mouth/Throat: Oropharynx is clear and moist.  Eyes: Pupils are equal, round, and reactive to light. Conjunctivae and EOM are normal.  Neck: Normal range of motion. Neck supple.  Cardiovascular: Normal rate, regular rhythm, S1 normal and S2 normal.  No murmur heard. Pulmonary/Chest: Effort normal and breath sounds normal. She has no wheezes. She has no rales.  Abdominal: Soft. Normal appearance and bowel sounds are normal. She exhibits no distension. There is tenderness in the left lower quadrant. There is no guarding.  Genitourinary:  Genitourinary Comments: Performed with RN chaperone present.  Nonthrombosed external hemorrhoids.  No fissure noted. Normal rectal tone. No masses of rectal vault. Obvious gross blood mixed with stool within rectum.   Musculoskeletal: Normal range of motion. She exhibits no edema or deformity.  Lymphadenopathy:    She has no cervical  adenopathy.  Neurological: She is alert.  Cranial nerves grossly intact. Patient moves extremities symmetrically and with good coordination.  Skin: Skin is warm and dry. No rash noted. No erythema.  Psychiatric: She has a normal mood and affect. Her behavior is normal. Judgment and thought content normal.  Nursing note and vitals reviewed.    ED Treatments / Results  Labs (all labs ordered are listed, but only abnormal results are displayed) Labs Reviewed  COMPREHENSIVE METABOLIC PANEL - Abnormal; Notable for the following components:      Result Value   Total Protein 6.4 (*)    All other components within normal limits  CBC - Abnormal; Notable for the following components:   Platelets 451 (*)    All other components  within normal limits  URINALYSIS, ROUTINE W REFLEX MICROSCOPIC - Abnormal; Notable for the following components:   APPearance HAZY (*)    Hgb urine dipstick SMALL (*)    Bacteria, UA RARE (*)    All other components within normal limits    EKG None  Radiology Ct Abdomen Pelvis W Contrast  Result Date: 11/19/2017 CLINICAL DATA:  New rectal bleeding for 2 days EXAM: CT ABDOMEN AND PELVIS WITH CONTRAST TECHNIQUE: Multidetector CT imaging of the abdomen and pelvis was performed using the standard protocol following bolus administration of intravenous contrast. CONTRAST:  171mL OMNIPAQUE IOHEXOL 300 MG/ML  SOLN COMPARISON:  11/06/2014 FINDINGS: Lower chest: No acute abnormality. Hepatobiliary: Mild fatty infiltration of the liver is noted. The gallbladder is within normal limits. Pancreas: Unremarkable. No pancreatic ductal dilatation or surrounding inflammatory changes. Spleen: Within normal limits. Adrenals/Urinary Tract: The adrenal glands are within normal limits. The kidneys are well visualize with a normal enhancement pattern. No renal calculi or obstructive changes are seen. The bladder is unremarkable. Stomach/Bowel: Diverticular change of the colon is noted without  evidence of diverticulitis. The appendix is within normal limits. No obstructive or inflammatory changes are seen. Vascular/Lymphatic: No significant vascular findings are present. No enlarged abdominal or pelvic lymph nodes. Reproductive: Status post hysterectomy. No adnexal masses. Other: No abdominal wall hernia or abnormality. No abdominopelvic ascites. Musculoskeletal: No acute or significant osseous findings. 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 Fatty liver. No other focal abnormality is noted. Electronically Signed   By: Inez Catalina M.D.   On: 11/19/2017 15:29    Procedures Procedures (including critical care time)  Medications Ordered in ED Medications  sodium chloride 0.9 % bolus 1,000 mL (1,000 mLs Intravenous New Bag/Given 11/19/17 1359)  iohexol (OMNIPAQUE) 300 MG/ML solution 100 mL (100 mLs Intravenous Contrast Given 11/19/17 1443)     Initial Impression / Assessment and Plan / ED Course  I have reviewed the triage vital signs and the nursing notes.  Pertinent labs & imaging results that were available during my care of the patient were reviewed by me and considered in my medical decision making (see chart for details).     Differential diagnosis includes diverticulitis, ischemic colitis, inflammatory bowel disease, hemorrhoidal bleeding, rectal fissure.  Clinical examination is not consistent with fissure.  Anoscopy attempted, but patient could not tolerate procedure.  CT abdomen pelvis demonstrates no signs consistent with colitis of the distal colon or sigmoid suggestive of diverticulitis or ischemic colitis.  Hemoglobin is stable.  Patient remains hemodynamically stable in the emergency department, with soft blood pressures, but patient demonstrated no orthostasis, and denies any sensation of dizziness or lightheadedness.  Record review demonstrates that patient previously has had blood pressures consistent with those in emergency department today.   Patient asymptomatic, and requesting discharge.  Per discussion with Dr. Hilarie Fredrickson gastroenterology, office to follow-up patient tomorrow.  Per discussion with the patient and her partner, as patient is deaf hard of hearing, patient requests that both her personal phone and her partners to be contacted if his cannot be reached.  Patient does have a video phone interpreter at home.  This is a supervised visit with Dr. Hilarie Fredrickson. Evaluation, management, and discharge planning discussed with this attending physician.  Final Clinical Impressions(s) / ED Diagnoses   Final diagnoses:  Rectal bleeding    ED Discharge Orders        Ordered    dicyclomine (BENTYL) 20 MG tablet  2 times daily     11/19/17  1816       Tamala Julian 11/19/17 1830    Milton Ferguson, MD 11/19/17 2140

## 2017-11-19 NOTE — ED Triage Notes (Signed)
Per ASL interpreter: Patient to ED c/o new rectal bleeding x 2 days - reports when having a bowel movement, she's noticed bright red blood with her stools. Recently had a hysterectomy and since has had issues with prolapsed bladder. She reports intermittent nausea and abdominal cramping on the lower left side of abdomen; also reports straining some last week. Unknown if stools are black.

## 2017-11-20 ENCOUNTER — Telehealth: Payer: Self-pay

## 2017-11-20 NOTE — Telephone Encounter (Signed)
Pt scheduled to see Ellouise Newer PA 11/21/17@1 :45pm. Patients significant other aware of appt.

## 2017-11-20 NOTE — Telephone Encounter (Signed)
-----   Message from Jerene Bears, MD sent at 11/20/2017  7:47 AM EDT ----- Regarding: FW: ED Patient Follow Up  See below re: patient that needs a new pt appt with APP Thanks JMP  ----- Message ----- From: Tamala Julian Sent: 11/19/2017   6:30 PM To: Jerene Bears, MD Subject: ED Patient Follow Up                           Hello Dr. Hilarie Fredrickson,  Thank you so much for consulting on the above patient in the emergency department through phone call regarding rectal bleeding.  I just wanted to follow-up, as patient is deaf hard of hearing, and asked that your office contact her partner, who is listed in her demographics as emergency contact, should she not be reached.  Patient does have a home video interpreter for these communications.  Thank you so much for caring for mutual patients.  Alyssa B. Cathi [facility]

## 2017-11-21 ENCOUNTER — Ambulatory Visit (INDEPENDENT_AMBULATORY_CARE_PROVIDER_SITE_OTHER): Payer: Medicaid Other | Admitting: Physician Assistant

## 2017-11-21 ENCOUNTER — Encounter: Payer: Self-pay | Admitting: Physician Assistant

## 2017-11-21 VITALS — BP 110/68 | HR 76 | Ht 68.0 in | Wt 160.0 lb

## 2017-11-21 DIAGNOSIS — K921 Melena: Secondary | ICD-10-CM | POA: Diagnosis not present

## 2017-11-21 DIAGNOSIS — R109 Unspecified abdominal pain: Secondary | ICD-10-CM

## 2017-11-21 MED ORDER — PEG-KCL-NACL-NASULF-NA ASC-C 100 G PO SOLR: 1.0000 | Freq: Once | ORAL | 0 refills | Status: AC

## 2017-11-21 NOTE — Progress Notes (Signed)
Chief Complaint: Rectal bleeding  HPI:    Latasha Parker is a 42 year old hearing impaired female with a past medical history as listed below including COPD and bladder prolapse, who presents to clinic as a new patient for follow-up after being seen in the ER for rectal bleeding.      11/19/2017 presented to the ER with complaint of rectal bleeding for 2 days.  Associated abdominal cramping at that time.  Rectal exam at that time with nonthrombosed external hemorrhoids and no other abnormality.  Obvious gross blood mixed with stool in the rectum.  Patient could not tolerate anoscopy.  CT of the abdomen pelvis with contrast showed diverticular change without evidence of diverticulitis, fatty liver and no other abnormality.  CBC and CMP that day was normal.  Sent home with prescription of Bentyl 20 mg 2 times daily.    ASL interpreter present at time of interview.    Today, explains that Friday night she had dinner and then felt something "soft" coming out of her bottom.  She went to the bathroom and had a fairly large amount of bright red blood come out with a stool.  This "smelled horrible".  The patient then started to feel sick and got worried.  She developed lower abdominal cramping on the left side of her abdomen as well as bloating.  Her bowel habits changed towards looser stools and she had 3-4 loose stools per day until Sunday morning.  She then presented to the ER as above.  Since then she has continued with some bright red blood with bowel movements which are slightly softer than normal and left-sided abdominal pain.  Her normal is constipation describing a bowel movement every couple of days.  Denies any recent antibiotics or sick contacts.  No previous history of the same.    (Vague history of possible bladder prolapse? For which she is supposed to follow with Urology? Can not quite understand what patient is referring to.)    Denies fever, chills, nausea or vomiting, rectal pain, weight loss,  family history of colon cancer or polyps.  Past Medical History:  Diagnosis Date  . Arthritis   . Asthma   . Cyst of ovary    right  . Deaf   . Hyperthyroidism   . Menorrhagia   . Pelvic pain   . SVD (spontaneous vaginal delivery)     Past Surgical History:  Procedure Laterality Date  . DIAGNOSTIC LAPAROSCOPY  2003   ovarian cyst removed  . FOOT SURGERY    . LAPAROSCOPIC ASSISTED VAGINAL HYSTERECTOMY N/A 08/12/2014   Procedure: LAPAROSCOPIC ASSISTED VAGINAL HYSTERECTOMY;  Surgeon: Emily Filbert, MD;  Location: [facility];  Service: Gynecology;  Laterality: N/A;  . LAPAROSCOPIC SALPINGO OOPHERECTOMY Right 08/12/2014   Procedure: REMOVE RIGHT SALPINGO OOPHORECTOMY;  Surgeon: Emily Filbert, MD;  Location: [facility];  Service: Gynecology;  Laterality: Right;  . LAPAROSCOPY N/A 07/17/2012   Procedure: LAPAROSCOPY OPERATIVE;  Surgeon: Mora Bellman, MD;  Location: [facility];  Service: Gynecology;  Laterality: N/A;  left oopherectomy and salpingectomy  . MULTIPLE EXTRACTIONS WITH ALVEOLOPLASTY N/A 06/06/2014   Procedure: MULTIPLE EXTRACTIONS WITH ALVEOLOPLASTY/REMOVAL OF TORI;  Surgeon: Gae Bon, DDS;  Location: [facility];  Service: Oral Surgery;  Laterality: N/A;  . ovary tumor     cyst  . WISDOM TOOTH EXTRACTION      Current Outpatient Medications  Medication Sig Dispense Refill  . ALPRAZolam (XANAX) 0.5 MG tablet Take 1 tablet (0.5 mg total) by mouth  3 (three) times daily as needed. (Patient taking differently: Take 0.5 mg by mouth at bedtime. ) 90 tablet 2  . dicyclomine (BENTYL) 20 MG tablet Take 1 tablet (20 mg total) by mouth 2 (two) times daily. 20 tablet 0  . FLOVENT HFA 44 MCG/ACT inhaler INHALE 2 PUFFS BY MOUTH TWICE A DAY (Patient taking differently: INHALE 2 PUFFS BY MOUTH TWICE A DAY AS NEEDED FOR SHORTNESS OF BREATH/WHEEZING) 31.8 Inhaler 3  . IRON PO Take 1 tablet by mouth daily.    . Multiple Vitamin (MULTIVITAMIN WITH MINERALS) TABS tablet Take 1 tablet by mouth daily.     No  current facility-administered medications for this visit.     Allergies as of 11/21/2017 - Review Complete 11/19/2017  Allergen Reaction Noted  . Tramadol Nausea And Vomiting 02/29/2012  . Vicodin [hydrocodone-acetaminophen] Nausea And Vomiting 06/05/2014    Family History  Problem Relation Age of Onset  . Cancer Mother   . Cancer Maternal Grandmother   . Breast cancer Maternal Grandmother   . Cancer Father   . Breast cancer Paternal Aunt     Social History   Socioeconomic History  . Marital status: Divorced    Spouse name: Not on file  . Number of children: Not on file  . Years of education: Not on file  . Highest education level: Not on file  Occupational History  . Not on file  Social Needs  . Financial resource strain: Not on file  . Food insecurity:    Worry: Not on file    Inability: Not on file  . Transportation needs:    Medical: Not on file    Non-medical: Not on file  Tobacco Use  . Smoking status: Current Every Day Smoker    Packs/day: 0.50    Years: 15.00    Pack years: 7.50    Types: Cigarettes  . Smokeless tobacco: Never Used  Substance and Sexual Activity  . Alcohol use: No  . Drug use: No  . Sexual activity: Not Currently    Birth control/protection: None  Lifestyle  . Physical activity:    Days per week: Not on file    Minutes per session: Not on file  . Stress: Not on file  Relationships  . Social connections:    Talks on phone: Not on file    Gets together: Not on file    Attends religious service: Not on file    Active member of club or organization: Not on file    Attends meetings of clubs or organizations: Not on file    Relationship status: Not on file  . Intimate partner violence:    Fear of current or ex partner: Not on file    Emotionally abused: Not on file    Physically abused: Not on file    Forced sexual activity: Not on file  Other Topics Concern  . Not on file  Social History Narrative  . Not on file    Review of  Systems:    Constitutional: No weight loss, fever or chills Skin: No rash Cardiovascular: No chest pain Respiratory: No SOB  Gastrointestinal: See HPI and otherwise negative Genitourinary: No dysuria  Neurological: No headache, dizziness or syncope Musculoskeletal: No new muscle or joint pain Hematologic: No bruising Psychiatric: No history of depression or anxiety   Physical Exam:  Vital signs: BP 110/68   Pulse 76   Ht 5\' 8"  (1.727 m)   Wt 160 lb (72.6 kg)   LMP 06/12/2014 (  Exact Date)   BMI 24.33 kg/m    Constitutional:   Pleasant Caucasian female appears to be in NAD, Well developed, Well nourished, alert and cooperative Head:  Normocephalic and atraumatic. Eyes:   PEERL, EOMI. No icterus. Conjunctiva pink. Ears:  Normal auditory acuity. Neck:  Supple Throat: Oral cavity and pharynx without inflammation, swelling or lesion.  Respiratory: Respirations even and unlabored. Lungs clear to auscultation bilaterally.   No wheezes, crackles, or rhonchi.  Cardiovascular: Normal S1, S2. No MRG. Regular rate and rhythm. No peripheral edema, cyanosis or pallor.  Gastrointestinal:  Soft, nondistended, Mild left sided ttp. No rebound or guarding. Normal bowel sounds. No appreciable masses or hepatomegaly. Rectal: External: No external hemorrhoids, polyps or tags, residue of bloody stool on skin; internal exam: No tenderness or mass Msk:  Symmetrical without gross deformities. Without edema, no deformity or joint abnormality.  Neurologic:  Alert and  oriented x4;  grossly normal neurologically.  Skin:   Dry and intact without significant lesions or rashes. Psychiatric: Demonstrates good judgement and reason without abnormal affect or behaviors.  RELEVANT LABS AND IMAGING: CBC    Component Value Date/Time   WBC 9.2 11/19/2017 1123   RBC 4.27 11/19/2017 1123   HGB 13.1 11/19/2017 1123   HGB 13.8 09/20/2017 1630   HCT 40.7 11/19/2017 1123   HCT 41.0 09/20/2017 1630   PLT 451 (H)  11/19/2017 1123   PLT 480 (H) 09/20/2017 1630   MCV 95.3 11/19/2017 1123   MCV 92 09/20/2017 1630   MCH 30.7 11/19/2017 1123   MCHC 32.2 11/19/2017 1123   RDW 14.3 11/19/2017 1123   RDW 15.0 09/20/2017 1630   LYMPHSABS 2.4 11/06/2014 0950   MONOABS 0.6 11/06/2014 0950   EOSABS 0.3 11/06/2014 0950   BASOSABS 0.1 11/06/2014 0950    CMP     Component Value Date/Time   NA 139 11/19/2017 1123   K 3.6 11/19/2017 1123   CL 106 11/19/2017 1123   CO2 25 11/19/2017 1123   GLUCOSE 85 11/19/2017 1123   BUN 7 11/19/2017 1123   CREATININE 0.70 11/19/2017 1123   CALCIUM 8.9 11/19/2017 1123   PROT 6.4 (L) 11/19/2017 1123   ALBUMIN 3.7 11/19/2017 1123   AST 23 11/19/2017 1123   ALT 19 11/19/2017 1123   ALKPHOS 87 11/19/2017 1123   BILITOT 0.3 11/19/2017 1123   GFRNONAA >60 11/19/2017 1123   GFRAA >60 11/19/2017 1123    Assessment: 1.  Rectal bleeding: Seen in ED, CT with diverticulosis and otherwise normal, anoscopy not tolerated, gross blood seen in the rectum at time of exam, hemoglobin stable, vitals stable-rectal exam today with continued gross hematochezia, no obvious source, patient stable; Consider diverticular bleed vs colitis vs other 2.  Abdominal pain: with above  Plan: 1.  Scheduled patient for colonoscopy with Dr. Lyndel Safe at the beginning of next week.  Did discuss risk, benefits, limitations and alternatives and the patient agrees to proceed. 2.  Discussed with patient that if she has any increase in bleeding, begins to feel short of breath or dizzy or has any increase in abdominal pain she needs to proceed to the ER.  She verbalized understanding. 3.  Patient to follow up in clinic per recommendations from Dr. Lyndel Safe after time of procedure.  Ellouise Newer, PA-C [facility] 11/21/2017, 1:57 PM

## 2017-11-21 NOTE — Patient Instructions (Addendum)
You have been scheduled for a colonoscopy. Please follow written instructions given to you at your visit today.  Please pick up your prep supplies at the pharmacy within the next 1-3 days. If you use inhalers (even only as needed), please bring them with you on the day of your procedure. Your physician has requested that you go to www.startemmi.com and enter the access code given to you at your visit today. This web site gives a general overview about your procedure. However, you should still follow specific instructions given to you by our office regarding your preparation for the procedure.  We have given you a low fiber diet handout.

## 2017-11-27 ENCOUNTER — Other Ambulatory Visit: Payer: Self-pay

## 2017-11-27 ENCOUNTER — Encounter: Payer: Self-pay | Admitting: Gastroenterology

## 2017-11-27 ENCOUNTER — Ambulatory Visit (AMBULATORY_SURGERY_CENTER): Payer: Medicaid Other | Admitting: Gastroenterology

## 2017-11-27 VITALS — BP 94/62 | HR 74 | Temp 97.8°F | Resp 13 | Ht 68.0 in | Wt 160.0 lb

## 2017-11-27 DIAGNOSIS — K921 Melena: Secondary | ICD-10-CM

## 2017-11-27 DIAGNOSIS — J449 Chronic obstructive pulmonary disease, unspecified: Secondary | ICD-10-CM | POA: Diagnosis not present

## 2017-11-27 DIAGNOSIS — R109 Unspecified abdominal pain: Secondary | ICD-10-CM | POA: Diagnosis not present

## 2017-11-27 DIAGNOSIS — F419 Anxiety disorder, unspecified: Secondary | ICD-10-CM | POA: Diagnosis not present

## 2017-11-27 MED ORDER — SODIUM CHLORIDE 0.9 % IV SOLN: 500.0000 mL | Freq: Once | INTRAVENOUS | Status: DC

## 2017-11-27 NOTE — Op Note (Signed)
[place] Patient Name: Latasha Parker Procedure Date: 11/27/2017 9:40 AM MRN: [id number] Endoscopist: Jackquline Denmark , MD Age: 42 Referring MD:  Date of Birth: July 11, 1975 Gender: Female Account #: [id number] Procedure:                Colonoscopy Indications:              Rectal bleeding with normal hemoglobin. Medicines:                Monitored Anesthesia Care Procedure:                Pre-Anesthesia Assessment:                           - Prior to the procedure, a History and Physical                            was performed, and patient medications and                            allergies were reviewed. The patient is competent.                            The risks and benefits of the procedure and the                            sedation options and risks were discussed with the                            patient. All questions were answered and informed                            consent was obtained. Patient identification and                            proposed procedure were verified by the physician                            in the procedure room. Mental Status Examination:                            alert and oriented. Prophylactic Antibiotics: The                            patient does not require prophylactic antibiotics.                            Prior Anticoagulants: The patient has taken no                            previous anticoagulant or antiplatelet agents. ASA                            Grade Assessment: I - A normal, healthy patient.  After reviewing the risks and benefits, the patient                            was deemed in satisfactory condition to undergo the                            procedure. The anesthesia plan was to use monitored                            anesthesia care (MAC). Immediately prior to                            administration of medications, the patient was                            re-assessed for  adequacy to receive sedatives. The                            heart rate, respiratory rate, oxygen saturations,                            blood pressure, adequacy of pulmonary ventilation,                            and response to care were monitored throughout the                            procedure. The physical status of the patient was                            re-assessed after the procedure.                           After obtaining informed consent, the colonoscope                            was passed under direct vision. Throughout the                            procedure, the patient's blood pressure, pulse, and                            oxygen saturations were monitored continuously. The                            Colonoscope was introduced through the anus and                            advanced to the 4 cm into the ileum. The                            colonoscopy was performed without difficulty. The  patient tolerated the procedure well. The quality                            of the bowel preparation was fair. Aggressive                            suctioning and aspiration was performed. Overall,                            90-95% of the colonic mucosa was visualized                            satisfactorily. Scope In: 9:45:18 AM Scope Out: 8:45:36 AM Scope Withdrawal Time: 0 hours 9 minutes 17 seconds  Total Procedure Duration: 0 hours 12 minutes 28 seconds  Findings:                 Multiple small-mouthed diverticula were found in                            the sigmoid colon, descending colon, few in the                            transverse colon and ascending colon.                           Non-bleeding internal hemorrhoids were found during                            retroflexion. The hemorrhoids were small.                           The exam was otherwise without abnormality on                            direct and retroflexion views. No  active bleeding. Complications:            No immediate complications. Estimated Blood Loss:     Estimated blood loss: none. Impression:               - Pancolonic diverticulosis predominantly in the                            sigmoid colon.                           - Small internal hemorrhoids. Recommendation:           - Patient has a contact number available for                            emergencies. The signs and symptoms of potential                            delayed complications were discussed with the  patient. Return to normal activities tomorrow.                            Written discharge instructions were provided to the                            patient.                           - High fiber diet.                           - Use Benefiber one teaspoon PO daily.                           - Patient has a contact number available for                            emergencies. The signs and symptoms of potential                            delayed complications were discussed with the                            patient. Return to normal activities tomorrow.                            Written discharge instructions were provided to the                            patient.                           - Continue present medications.                           - Return to GI clinic if still with problems. Do                            recommend colonoscopy at the age of 63 as a part of                            colorectal cancer screening. Certainly, earlier if                            she still has any new problems. Jackquline Denmark, MD 11/27/2017 10:06:40 AM This report has been signed electronically.

## 2017-11-27 NOTE — Progress Notes (Signed)
Report given to PACU, vss 

## 2017-11-27 NOTE — Progress Notes (Signed)
Interpreter used today at the [facility] for this pt.  Interpreter's name is-Christie Mahaffey

## 2017-11-27 NOTE — Progress Notes (Signed)
Interpreter used today at the [facility] for this pt.  Interpreter's name is-Christie M.  Pt's history reviewed today.

## 2017-11-27 NOTE — Patient Instructions (Signed)
USE BENEFIBER  ONE TEASPOON BY MOUTH DAILY HIGH FIBER DIET MIRALAX EVERY DAY  YOU HAD AN ENDOSCOPIC PROCEDURE TODAY AT [facility]:   Refer to the procedure report that was given to you for any specific questions about what was found during the examination.  If the procedure report does not answer your questions, please call your gastroenterologist to clarify.  If you requested that your care partner not be given the details of your procedure findings, then the procedure report has been included in a sealed envelope for you to review at your convenience later.  YOU SHOULD EXPECT: Some feelings of bloating in the abdomen. Passage of more gas than usual.  Walking can help get rid of the air that was put into your GI tract during the procedure and reduce the bloating. If you had a lower endoscopy (such as a colonoscopy or flexible sigmoidoscopy) you may notice spotting of blood in your stool or on the toilet paper. If you underwent a bowel prep for your procedure, you may not have a normal bowel movement for a few days.  Please Note:  You might notice some irritation and congestion in your nose or some drainage.  This is from the oxygen used during your procedure.  There is no need for concern and it should clear up in a day or so.  SYMPTOMS TO REPORT IMMEDIATELY:   Following lower endoscopy (colonoscopy or flexible sigmoidoscopy):  Excessive amounts of blood in the stool  Significant tenderness or worsening of abdominal pains  Swelling of the abdomen that is new, acute  Fever of 100F or higher For urgent or emergent issues, a gastroenterologist can be reached at any hour by calling [phone].   DIET:  We do recommend a small meal at first, but then you may proceed to your regular diet.  Drink plenty of fluids but you should avoid alcoholic beverages for 24 hours.  ACTIVITY:  You should plan to take it easy for the rest of today and you should NOT DRIVE or use heavy  machinery until tomorrow (because of the sedation medicines used during the test).    FOLLOW UP: Our staff will call the number listed on your records the next business day following your procedure to check on you and address any questions or concerns that you may have regarding the information given to you following your procedure. If we do not reach you, we will leave a message.  However, if you are feeling well and you are not experiencing any problems, there is no need to return our call.  We will assume that you have returned to your regular daily activities without incident.  If any biopsies were taken you will be contacted by phone or by letter within the next 1-3 weeks.  Please call us at [phone] if you have not heard about the biopsies in 3 weeks.    SIGNATURES/CONFIDENTIALITY: You and/or your care partner have signed paperwork which will be entered into your electronic medical record.  These signatures attest to the fact that that the information above on your After Visit Summary has been reviewed and is understood.  Full responsibility of the confidentiality of this discharge information lies with you and/or your care-partner.

## 2017-11-28 ENCOUNTER — Telehealth: Payer: Self-pay

## 2017-11-28 NOTE — Telephone Encounter (Signed)
  Follow up Call-  Call back number 11/27/2017  Post procedure Call Back phone  # [phone]  Permission to leave phone message Yes  Some recent data might be hidden      VM left with service for the deaf Latasha Parker/2nd call-back [place]

## 2017-11-28 NOTE — Telephone Encounter (Signed)
Called [phone] and left a messaged we tried to reach pt for a follow up call. maw

## 2017-12-06 ENCOUNTER — Telehealth: Payer: Self-pay | Admitting: *Deleted

## 2017-12-06 NOTE — Telephone Encounter (Signed)
Latasha Parker called this am and left a voicemail that she needs the phone number for the urologist. States she lost the phone number.

## 2017-12-07 NOTE — Telephone Encounter (Signed)
Called patient stating I am returning her phone call. Patient states a couple months ago she was referred to a urologist but lost their phone number & would like to make an appt with them. Offered to schedule an appt for her & patient agreed. Scheduled appt with [facility] 8/9 @ 9:15. Patient informed. Patient verbalized understanding & had no other questions. Call was interpreted through video relay service.

## 2017-12-29 DIAGNOSIS — R350 Frequency of micturition: Secondary | ICD-10-CM | POA: Diagnosis not present

## 2017-12-29 DIAGNOSIS — N8111 Cystocele, midline: Secondary | ICD-10-CM | POA: Diagnosis not present

## 2017-12-29 DIAGNOSIS — R351 Nocturia: Secondary | ICD-10-CM | POA: Diagnosis not present

## 2017-12-29 DIAGNOSIS — N393 Stress incontinence (female) (male): Secondary | ICD-10-CM | POA: Diagnosis not present

## 2018-02-16 ENCOUNTER — Telehealth: Payer: Self-pay | Admitting: Pulmonary Disease

## 2018-02-16 ENCOUNTER — Other Ambulatory Visit: Payer: Self-pay | Admitting: Pulmonary Disease

## 2018-02-16 NOTE — Telephone Encounter (Signed)
Spoke with pt via sign language interpreter service. She is requesting a refill on Xanax. Advised her that we would need to speak to Dr. Halford Chessman about this refill. This was last refilled on 10/23/17 #90 with 2 refills.  Dr. Halford Chessman - please advise on refill. Thanks.

## 2018-02-19 ENCOUNTER — Other Ambulatory Visit: Payer: Self-pay | Admitting: Pulmonary Disease

## 2018-02-19 NOTE — Telephone Encounter (Signed)
Per NP Wyn Quaker overdose risk score is 170. Ok to refill for 15 tablets and have VS to address when back in office. Alprazolam 0.5mg  one tab po TID PRN #[id number] called into [facility] on [address]. Called patient via Deaf Interpreter letting her know that the prescription has been called in for a 1 week supply until VS is back in office. Will route information to VS as FYI.

## 2018-02-21 NOTE — Telephone Encounter (Signed)
Per NP Wyn Quaker overdose risk score is 170. Ok to refill for 15 tablets and have VS to address when back in office. Alprazolam 0.5mg  one tab po TID PRN #[id number] called into [facility] on [address]. Called patient via Deaf Interpreter letting her know that the prescription has been called in for a 1 week supply until VS is back in office. Will route information to VS as FYI.

## 2018-02-26 ENCOUNTER — Telehealth: Payer: Self-pay | Admitting: Pulmonary Disease

## 2018-02-26 NOTE — Telephone Encounter (Signed)
Alprazolam 0.5mg , prescription printed and signed by Dr. Halford Chessman.  [facility] [place].  Patient notified via interpreter .  Nothing further at this time.

## 2018-02-26 NOTE — Telephone Encounter (Signed)
Returned call via interpreter.  She stated that the prescription she picked [facility] at [facility] today was for Alprazolam 0.5mg , take 1 at bedtime, #30.  Patient stated that she takes it three times a day and was questioning the change.  Prescription was for #30, with 3 refills.  Dr. Halford Chessman, please advise

## 2018-02-26 NOTE — Telephone Encounter (Signed)
Pt has questions concerning new medication. Pt contact [phone]

## 2018-02-27 NOTE — Telephone Encounter (Signed)
Okay to cancel previous script, and change to xanax 0.5 mg tid, #90 with 3 refills.

## 2018-02-27 NOTE — Telephone Encounter (Signed)
Called and spoke with pharmacist, patient did pick up prescription for the once a day xanax quantity 30 with 3 refills.   VS please advise how you would like to send in additional medication to equal TID

## 2018-02-27 NOTE — Telephone Encounter (Signed)
Called patient unable to reach left message to give us a call back.

## 2018-02-27 NOTE — Telephone Encounter (Signed)
Pt is calling back [phone] 

## 2018-02-28 NOTE — Telephone Encounter (Signed)
Called and spoke to pt via interpreter. Pt is aware that we are currently awaiting a response from VS, and we will contact her once we have a response.

## 2018-02-28 NOTE — Telephone Encounter (Signed)
Pt calling back for a f/u on her situation. CB  [phone]

## 2018-02-28 NOTE — Telephone Encounter (Signed)
Pt is calling back [phone] 

## 2018-02-28 NOTE — Telephone Encounter (Signed)
Left message for patient stating we will call back once we have answers from VS as what to do next since she has already picked up original Rx.

## 2018-03-01 NOTE — Telephone Encounter (Signed)
Can we call the pharmacy to notify them of the change in her prescription.  She can then fill the new prescription early.  She needs to have xanax 0.5 mg tid, #90 with 3 refills.

## 2018-03-01 NOTE — Telephone Encounter (Signed)
Called spoke with Pharmacy and was able to get the Xanax refilled and ordered per VS orders.  Called patient unable to reach but message left the medication was refilled.   Nothing further needed at this time

## 2018-05-07 ENCOUNTER — Telehealth: Payer: Self-pay | Admitting: Pulmonary Disease

## 2018-05-07 MED ORDER — ALPRAZOLAM 0.5 MG PO TABS: 0.5000 mg | ORAL_TABLET | Freq: Every day | ORAL | 0 refills | Status: DC

## 2018-05-07 NOTE — Telephone Encounter (Signed)
Okay to send refill. 

## 2018-05-07 NOTE — Telephone Encounter (Signed)
Called and spoke with patient, advised that we are sending in the prescription. Nothing further needed.

## 2018-05-07 NOTE — Telephone Encounter (Signed)
Via interpreter patient is requesting a refill of her Alprazolam. She was last given a refill on 02/26/18. Patient takes the medication 0.5 TID VS please advise thank you.

## 2018-05-07 NOTE — Telephone Encounter (Signed)
Called and spoke with patient she wanted to make sure that her Alprazolam had been sent to the pharmacy advised that it has already been sent in.

## 2018-05-07 NOTE — Telephone Encounter (Signed)
Pt returning call CB  [phone]

## 2018-05-13 DIAGNOSIS — S5011XA Contusion of right forearm, initial encounter: Secondary | ICD-10-CM | POA: Diagnosis not present

## 2018-06-07 ENCOUNTER — Telehealth: Payer: Self-pay | Admitting: Pulmonary Disease

## 2018-06-07 ENCOUNTER — Other Ambulatory Visit: Payer: Self-pay | Admitting: Pulmonary Disease

## 2018-06-07 NOTE — Telephone Encounter (Signed)
Called through interpreter, no answer, left message for Patient to call back through interpreter.

## 2018-06-07 NOTE — Telephone Encounter (Signed)
Pt is calling back [phone]

## 2018-06-07 NOTE — Telephone Encounter (Signed)
Spoke with pt via translator  She is requesting alprazolam refill  Last sent on 05/07/18 with 90 tablets  Please authorize and send refill per new protocol thanks

## 2018-06-08 ENCOUNTER — Other Ambulatory Visit: Payer: Self-pay | Admitting: Pulmonary Disease

## 2018-06-08 MED ORDER — ALPRAZOLAM 0.5 MG PO TABS: 0.5000 mg | ORAL_TABLET | Freq: Every day | ORAL | 0 refills | Status: DC

## 2018-06-08 NOTE — Telephone Encounter (Signed)
Called p hours for refill request but had originally called on 1/16 , will give #30 and have Dr Halford Chessman work out after weekend whether to  approve more then

## 2018-06-08 NOTE — Telephone Encounter (Signed)
Will await response from Dr. Halford Chessman and then call pt back.

## 2018-06-08 NOTE — Telephone Encounter (Signed)
Dr. Halford Chessman, please advise if it is okay for [place] to refill med for pt. Thanks!

## 2018-06-08 NOTE — Telephone Encounter (Signed)
Pt is calling back [phone] please leave a detailed message

## 2018-06-13 ENCOUNTER — Telehealth: Payer: Self-pay | Admitting: Pulmonary Disease

## 2018-06-13 NOTE — Telephone Encounter (Signed)
Med was refilled 06/08/2018 by Dr.Wert, #30 tabs with 0 RF.

## 2018-06-13 NOTE — Telephone Encounter (Signed)
Called Patient through sign language interpreter.  Left message to call back.

## 2018-06-13 NOTE — Telephone Encounter (Signed)
Received call from Patient via interpreter.  She is requesting her alprazolam refill.  Last refill was by Dr. Melvyn Novas 06/08/18, #30, take 1/2 tab at bedtime.  She stated that she takes 1 tab TID.    Dr.Sood, please advise on refill

## 2018-06-14 NOTE — Telephone Encounter (Signed)
ATC through the interpreter service and the line rang 15 x with NA  WCB later to inform her rx sent

## 2018-06-14 NOTE — Telephone Encounter (Signed)
Scripted sent.

## 2018-06-14 NOTE — Telephone Encounter (Signed)
Called to the patient Via interpreter services patient verbalized understanding nothing further need at this time.

## 2018-06-14 NOTE — Telephone Encounter (Signed)
Pt is returning call CB# [phone]

## 2018-06-14 NOTE — Telephone Encounter (Signed)
Patient is returning phone call,  Phone number is [phone].

## 2018-06-14 NOTE — Telephone Encounter (Signed)
Spoke with the pt and notified that we are waiting on approval on her xanax from Dr Halford Chessman  She wants a call once this has been sent  Please advise

## 2018-06-15 ENCOUNTER — Telehealth: Payer: Self-pay | Admitting: Pulmonary Disease

## 2018-06-15 MED ORDER — ALPRAZOLAM 1 MG PO TABS: 1.0000 mg | ORAL_TABLET | Freq: Three times a day (TID) | ORAL | 5 refills | Status: DC | PRN

## 2018-06-15 NOTE — Telephone Encounter (Signed)
[facility]  New script for alprazolam 1 mg tid prn sleep and anxiety sent to [facility].

## 2018-06-15 NOTE — Telephone Encounter (Signed)
Per VS he will be taking care of this message.

## 2018-06-15 NOTE — Telephone Encounter (Signed)
[facility], alprazolam 0.5mg  is on back order.   Called and spoke with Pt. Through interpreter. Patient requesting prescription to be sent to [facility] [place].  Will route to Dr. Halford Chessman

## 2018-07-12 ENCOUNTER — Telehealth: Payer: Self-pay | Admitting: Pulmonary Disease

## 2018-07-12 NOTE — Telephone Encounter (Signed)
Called and spoke with patient via interpreter.  She was wanting prescription instructions for Alprazolam.  Alprazolam 1mg  , take 1 tab three times a days, as needed for anxiety or sleep, #30 with 5 refills sent 06/15/18.  She stated that she received 30 tabs, and wanted to know if that was correct.  Understanding stated.  Nothing further at this time.

## 2018-08-09 ENCOUNTER — Telehealth: Payer: Self-pay | Admitting: Pulmonary Disease

## 2018-08-09 NOTE — Telephone Encounter (Signed)
Called and spoke with patient via interpreter. Patient is requesting a refill of her xanax. Advised patient that the refill was sent in on 06/15/18 with quantity of 30 and 5 refills. Patient got really upset and stated that she wanted to be seen by VS because she wants her medications to be changed to where she has it more often. Patient stated that she wanted an appointment. Appointment made. Nothing further needed.

## 2018-08-14 ENCOUNTER — Encounter: Payer: Self-pay | Admitting: Pulmonary Disease

## 2018-08-14 ENCOUNTER — Telehealth (INDEPENDENT_AMBULATORY_CARE_PROVIDER_SITE_OTHER): Payer: Medicaid Other | Admitting: Pulmonary Disease

## 2018-08-14 ENCOUNTER — Other Ambulatory Visit: Payer: Self-pay

## 2018-08-14 DIAGNOSIS — F419 Anxiety disorder, unspecified: Secondary | ICD-10-CM | POA: Insufficient documentation

## 2018-08-14 DIAGNOSIS — Z720 Tobacco use: Secondary | ICD-10-CM

## 2018-08-14 DIAGNOSIS — J449 Chronic obstructive pulmonary disease, unspecified: Secondary | ICD-10-CM

## 2018-08-14 DIAGNOSIS — Z0000 Encounter for general adult medical examination without abnormal findings: Secondary | ICD-10-CM

## 2018-08-14 NOTE — Assessment & Plan Note (Signed)
Assessment: Worsened anxiety over the last 2 to 3 years Patient has been managed telephonically via telephone encounters for management of anxiety from this office Patient currently does not have a primary care provider or psychiatric management Patient reports that she has been taking her alprazolam 1 mg 3 times daily  Plan: Patient has refills of alprazolam for her to manage her anxiety Referral to primary care for chronic management Referral to psychiatry for chronic management of anxiety

## 2018-08-14 NOTE — Assessment & Plan Note (Signed)
Assessment: Worsened anxiety over the past year No current primary care provider  Plan: Referral to establish primary care provider Referral to establish psychiatry

## 2018-08-14 NOTE — Assessment & Plan Note (Signed)
Assessment: Current every day smoker, 5 cigarettes a day  Plan: Continue to reduce your smoking You need to set a quit date We recommend that you stop smoking

## 2018-08-14 NOTE — Patient Instructions (Signed)
We will update your address on file today:   [address]  [place], [place] [address]   Referral to primary care  Referral to psychiatry    We recommend that you stop smoking.  >>>You need to set a quit date >>>If you have friends or family who smoke, let them know you are trying to quit and not to smoke around you or in your living environment  Smoking Cessation Resources:  1 800 QUIT NOW  >>> Patient to call this resource and utilize it to help support her quit smoking >>> Keep up your hard work with stopping smoking  You can also contact the [facility] >>>For smoking cessation classes call [phone]  We do not recommend using e-cigarettes as a form of stopping smoking  You can sign up for smoking cessation support texts and information:  >>>https://smokefree.gov/smokefreetxt   Continue Flovent 75mcg as prescribed    Follow up with our office in 3 months June/2020   Coronavirus (COVID-19) Are you at risk?  Are you at risk for the Coronavirus (COVID-19)?  To be considered HIGH RISK for Coronavirus (COVID-19), you have to meet the following criteria:  . Traveled to [place], [place], [place], [place] or [place]; or in the [place] to [place], [place], [place], or [place]; and have fever, cough, and shortness of breath within the last 2 weeks of travel OR . Been in close contact with a person diagnosed with COVID-19 within the last 2 weeks and have fever, cough, and shortness of breath . IF YOU DO NOT MEET THESE CRITERIA, YOU ARE CONSIDERED LOW RISK FOR COVID-19.  What to do if you are HIGH RISK for COVID-19?  Marland Kitchen If you are having a medical emergency, call 911. . Seek medical care right away. Before you go to a doctor's office, urgent care or emergency department, call ahead and tell them about your recent travel, contact with someone diagnosed with COVID-19, and your symptoms. You should receive instructions from your physician's office  regarding next steps of care.  . When you arrive at healthcare provider, tell the healthcare staff immediately you have returned from visiting [place], [place], [place], [place] or [place]; or traveled in the [place] to [place], [place], [place], or [place]; in the last two weeks or you have been in close contact with a person diagnosed with COVID-19 in the last 2 weeks.   . Tell the health care staff about your symptoms: fever, cough and shortness of breath. . After you have been seen by a medical provider, you will be either: o Tested for (COVID-19) and discharged home on quarantine except to seek medical care if symptoms worsen, and asked to  - Stay home and avoid contact with others until you get your results (4-5 days)  - Avoid travel on public transportation if possible (such as bus, train, or airplane) or o Sent to the Emergency Department by EMS for evaluation, COVID-19 testing, and possible admission depending on your condition and test results.  What to do if you are LOW RISK for COVID-19?  Reduce your risk of any infection by using the same precautions used for avoiding the common cold or flu:  Marland Kitchen Wash your hands often with soap and warm water for at least 20 seconds.  If soap and water are not readily available, use an alcohol-based hand sanitizer with at least 60% alcohol.  . If coughing or sneezing, cover your mouth and nose by coughing or  sneezing into the elbow areas of your shirt or coat, into a tissue or into your sleeve (not your hands). . Avoid shaking hands with others and consider head nods or verbal greetings only. . Avoid touching your eyes, nose, or mouth with unwashed hands.  . Avoid close contact with people who are sick. . Avoid places or events with large numbers of people in one location, like concerts or sporting events. . Carefully consider travel plans you have or are making. . If you are planning any travel outside or inside the [place], visit the CDC's  Travelers' Health webpage for the latest health notices. . If you have some symptoms but not all symptoms, continue to monitor at home and seek medical attention if your symptoms worsen. . If you are having a medical emergency, call 911.   [place] / e-Visit: [email or web address]         [facility]: [facility]: [phone]                   [facility]: [phone]           It is flu season:   >>> Best ways to protect herself from the flu: Receive the yearly flu vaccine, practice good hand hygiene washing with soap and also using hand sanitizer when available, eat a nutritious meals, get adequate rest, hydrate appropriately   Please contact the office if your symptoms worsen or you have concerns that you are not improving.   Thank you for choosing [facility] for your healthcare, and for allowing [place] to partner with you on your healthcare journey. I am thankful to be able to provide care to you today.   Wyn Quaker FNP-C     Health Risks of Smoking Smoking cigarettes is very bad for your health. Tobacco smoke has over 200 known poisons in it. It contains the poisonous gases nitrogen oxide and carbon monoxide. There are over 60 chemicals in tobacco smoke that cause cancer. Smoking is difficult to quit because a chemical in tobacco, called nicotine, causes addiction or dependence. When you smoke and inhale, nicotine is absorbed rapidly into the bloodstream through your lungs. Both inhaled and non-inhaled nicotine may be addictive. What are the risks of cigarette smoke? Cigarette smokers have an increased risk of many serious medical problems, including:  Lung cancer.  Lung disease, such as pneumonia, bronchitis, and emphysema.  Chest pain (angina) and heart attack because the heart is not getting enough  oxygen.  Heart disease and peripheral blood vessel disease.  High blood pressure (hypertension).  Stroke.  Oral cancer, including cancer of the lip, mouth, or voice box.  Bladder cancer.  Pancreatic cancer.  Cervical cancer.  Pregnancy complications, including premature birth.  Stillbirths and smaller newborn babies, birth defects, and genetic damage to sperm.  Early menopause.  Lower estrogen level for women.  Infertility.  Facial wrinkles.  Blindness.  Increased risk of broken bones (fractures).  Senile dementia.  Stomach ulcers and internal bleeding.  Delayed wound healing and increased risk of complications during surgery.  Even smoking lightly shortens your life expectancy by several years. Because of secondhand smoke exposure, children of smokers have an increased risk of the following:  Sudden infant death syndrome (SIDS).  Respiratory infections.  Lung cancer.  Heart disease.  Ear infections. What are the benefits of quitting? There are many health benefits of quitting smoking. Here are some  of them:  Within days of quitting smoking, your risk of having a heart attack decreases, your blood flow improves, and your lung capacity improves. Blood pressure, pulse rate, and breathing patterns start returning to normal soon after quitting.  Within months, your lungs may clear up completely.  Quitting for 10 years reduces your risk of developing lung cancer and heart disease to almost that of a nonsmoker.  People who quit may see an improvement in their overall quality of life. How do I quit smoking?     Smoking is an addiction with both physical and psychological effects, and longtime habits can be hard to change. Your health care provider can recommend:  Programs and community resources, which may include group support, education, or talk therapy.  Prescription medicines to help reduce cravings.  Nicotine replacement products, such as patches,  gum, and nasal sprays. Use these products only as directed. Do not replace cigarette smoking with electronic cigarettes, which are commonly called e-cigarettes. The safety of e-cigarettes is not known, and some may contain harmful chemicals.  A combination of two or more of these methods. Where to find more information  American Lung Association: www.lung.org  American Cancer Society: www.cancer.org Summary  Smoking cigarettes is very bad for your health. Cigarette smokers have an increased risk of many serious medical problems, including several cancers, heart disease, and stroke.  Smoking is an addiction with both physical and psychological effects, and longtime habits can be hard to change.  By stopping right away, you can greatly reduce the risk of medical problems for you and your family.  To help you quit smoking, your health care provider can recommend programs, community resources, prescription medicines, and nicotine replacement products such as patches, gum, and nasal sprays. This information is not intended to replace advice given to you by your health care provider. Make sure you discuss any questions you have with your health care provider. Document Released: 06/16/2004 Document Revised: 08/10/2017 Document Reviewed: 05/13/2016 Elsevier Interactive Patient Education  2019 [place]  After being diagnosed with an anxiety disorder, you may be relieved to know why you have felt or behaved a certain way. It is natural to also feel overwhelmed about the treatment ahead and what it will mean for your life. With care and support, you can manage this condition and recover from it. How to cope with anxiety Dealing with stress Stress is your body's reaction to life changes and events, both good and bad. Stress can last just a few hours or it can be ongoing. Stress can play a major role in anxiety, so it is important to learn both how to cope with stress and  how to think about it differently. Talk with your health care provider or a counselor to learn more about stress reduction. He or she may suggest some stress reduction techniques, such as:  Music therapy. This can include creating or listening to music that you enjoy and that inspires you.  Mindfulness-based meditation. This involves being aware of your normal breaths, rather than trying to control your breathing. It can be done while sitting or walking.  Centering prayer. This is a kind of meditation that involves focusing on a word, phrase, or sacred image that is meaningful to you and that brings you peace.  Deep breathing. To do this, expand your stomach and inhale slowly through your nose. Hold your breath for 3-5 seconds. Then exhale slowly, allowing your stomach muscles to relax.  Self-talk.  This is a skill where you identify thought patterns that lead to anxiety reactions and correct those thoughts.  Muscle relaxation. This involves tensing muscles then relaxing them. Choose a stress reduction technique that fits your lifestyle and personality. Stress reduction techniques take time and practice. Set aside 5-15 minutes a day to do them. Therapists can offer training in these techniques. The training may be covered by some insurance plans. Other things you can do to manage stress include:  Keeping a stress diary. This can help you learn what triggers your stress and ways to control your response.  Thinking about how you respond to certain situations. You may not be able to control everything, but you can control your reaction.  Making time for activities that help you relax, and not feeling guilty about spending your time in this way. Therapy combined with coping and stress-reduction skills provides the best chance for successful treatment. Medicines Medicines can help ease symptoms. Medicines for anxiety include:  Anti-anxiety drugs.  Antidepressants.  Beta-blockers. Medicines  may be used as the main treatment for anxiety disorder, along with therapy, or if other treatments are not working. Medicines should be prescribed by a health care provider. Relationships Relationships can play a big part in helping you recover. Try to spend more time connecting with trusted friends and family members. Consider going to couples counseling, taking family education classes, or going to family therapy. Therapy can help you and others better understand the condition. How to recognize changes in your condition Everyone has a different response to treatment for anxiety. Recovery from anxiety happens when symptoms decrease and stop interfering with your daily activities at home or work. This may mean that you will start to:  Have better concentration and focus.  Sleep better.  Be less irritable.  Have more energy.  Have improved memory. It is important to recognize when your condition is getting worse. Contact your health care provider if your symptoms interfere with home or work and you do not feel like your condition is improving. Where to find help and support: You can get help and support from these sources:  Self-help groups.  Online and [employer].  A trusted spiritual leader.  Couples counseling.  Family education classes.  Family therapy. Follow these instructions at home:  Eat a healthy diet that includes plenty of vegetables, fruits, whole grains, low-fat dairy products, and lean protein. Do not eat a lot of foods that are high in solid fats, added sugars, or salt.  Exercise. Most adults should do the following: ? Exercise for at least 150 minutes each week. The exercise should increase your heart rate and make you sweat (moderate-intensity exercise). ? Strengthening exercises at least twice a week.  Cut down on caffeine, tobacco, alcohol, and other potentially harmful substances.  Get the right amount and quality of sleep. Most adults need 7-9  hours of sleep each night.  Make choices that simplify your life.  Take over-the-counter and prescription medicines only as told by your health care provider.  Avoid caffeine, alcohol, and certain over-the-counter cold medicines. These may make you feel worse. Ask your pharmacist which medicines to avoid.  Keep all follow-up visits as told by your health care provider. This is important. Questions to ask your health care provider  Would I benefit from therapy?  How often should I follow up with a health care provider?  How long do I need to take medicine?  Are there any long-term side effects of my medicine?  Are there any alternatives to taking medicine? Contact a health care provider if:  You have a hard time staying focused or finishing daily tasks.  You spend many hours a day feeling worried about everyday life.  You become exhausted by worry.  You start to have headaches, feel tense, or have nausea.  You urinate more than normal.  You have diarrhea. Get help right away if:  You have a racing heart and shortness of breath.  You have thoughts of hurting yourself or others. If you ever feel like you may hurt yourself or others, or have thoughts about taking your own life, get help right away. You can go to your nearest emergency department or call:  Your local emergency services (911 in the [place]  A suicide crisis helpline, such as the [facility] at 971-510-2804. This is open 24-hours a day. Summary  Taking steps to deal with stress can help calm you.  Medicines cannot cure anxiety disorders, but they can help ease symptoms.  Family, friends, and partners can play a big part in helping you recover from an anxiety disorder. This information is not intended to replace advice given to you by your health care provider. Make sure you discuss any questions you have with your health care provider. Document Released: 05/03/2016 Document  Revised: 05/03/2016 Document Reviewed: 05/03/2016 Elsevier Interactive Patient Education  2019 [employer].

## 2018-08-14 NOTE — Assessment & Plan Note (Addendum)
Assessment: Maintained well on Flovent 44, patient reports she is taking as needed which averages out to about 5 times a month She still is currently smoking 5 cigarettes a day Patient denies shortness of breath or wheezing at this time  Plan: Continue Flovent Rinse mouth after use Follow-up with our office in June/2020 Consider pulmonary function testing at next office visit

## 2018-08-14 NOTE — Progress Notes (Signed)
Virtual Visit via Telephone Note  I connected with Latasha Parker on 08/14/18 at  4:00 PM EDT by telephone and verified that I am speaking with the correct person using two identifiers.   I discussed the limitations, risks, security and privacy concerns of performing an evaluation and management service by telephone and the availability of in person appointments. I also discussed with the patient that there may be a patient responsible charge related to this service. The patient expressed understanding and agreed to proceed.   History of Present Illness:  43 year old female current every day smoker reached via the telephone using sign language interpreter for medication management.  Patient reporting that she has concerns regarding her current prescription for anxiety.  She currently has 1 mg alprazolam 3 times daily as needed for sleep and anxiety.  Patient reports she is been using this 3 times daily scheduled because she did not understand how to take this medication.  Patient also would like to update [place] on her new address which is listed below:  [address]  [place], [place] [address]  Patient was under the impression that Dr. Halford Chessman is her primary care provider.  Patient does not currently have a primary care provider and currently does not see a psychiatrist.  She reports that her anxiety has worsened over the past couple of years as she has been dealing with her daughter who is been giving her additional anxiety.  Patient reports that breathing she has been stable.  She is managed on her Flovent 44 mcg.  She is still currently smoking 5 cigarettes a day.  She is working on stopping smoking.   MMRC - Breathlessness Score 3 - I stop for breath after walking about 100 yards or after a few minutes on level ground (isle at grocery store is 193ft)   Observations/Objective:  PFT 01/21/14 >> borderline obstruction, normal lung volumes/diffusion, no BD response  Assessment and Plan:   COPD  (chronic obstructive pulmonary disease) with chronic bronchitis Assessment: Maintained well on Flovent 44, patient reports she is taking as needed which averages out to about 5 times a month She still is currently smoking 5 cigarettes a day Patient denies shortness of breath or wheezing at this time  Plan: Continue Flovent Rinse mouth after use Follow-up with our office in June/2020  Tobacco abuse Assessment: Current every day smoker, 5 cigarettes a day  Plan: Continue to reduce your smoking You need to set a quit date We recommend that you stop smoking  Health care maintenance Assessment: Worsened anxiety over the past year No current primary care provider  Plan: Referral to establish primary care provider Referral to establish psychiatry    I have checked [place] PMP aware and the patients overdose risk score is 240. Patient has 4 providers prescribing controlled substances. Patient has used 2 pharmacies.   Follow Up Instructions:  Keep planned follow-up with our office in June/2020   I discussed the assessment and treatment plan with the patient. The patient was provided an opportunity to ask questions and all were answered. The patient agreed with the plan and demonstrated an understanding of the instructions.   The patient was advised to call back or seek an in-person evaluation if the symptoms worsen or if the condition fails to improve as anticipated.  I provided 22 min minutes of non-face-to-face time during this encounter.   Lauraine Rinne, NP

## 2018-08-15 NOTE — Progress Notes (Signed)
I have reviewed and agree with assessment/plan.  Chesley Mires, MD [facility] 08/15/2018, 12:17 PM

## 2018-08-17 ENCOUNTER — Telehealth: Payer: Medicaid Other | Admitting: Pulmonary Disease

## 2018-08-17 ENCOUNTER — Ambulatory Visit: Payer: Medicaid Other | Admitting: Pulmonary Disease

## 2018-08-29 ENCOUNTER — Telehealth: Payer: Self-pay | Admitting: Pulmonary Disease

## 2018-08-29 NOTE — Telephone Encounter (Signed)
Called pt via interpreter line. Per pt, she is now having to take xanax tid to help with anxiety. Pt stated she had been receiving quantity of 90 tabs of xanax a month but then the dose was changed to a stronger dose and when that happened, the supply of xanax was decreased to 45 tabs a month and then was later decreased to 30 tabs a month.  Pt had a televisit with Wyn Quaker, NP 3/24 and at this televisit, a referral was placed for pt for primary care physician as well as a referral for psychiatrist to help manage her anxiety and meds. Pt stated that she has not heard anything in regards to an appt for a PCP nor has she heard from anyone in regards to the referral to psychiatry.  PCCs, is there any way you could help [place] out with this? Thanks!

## 2018-08-29 NOTE — Telephone Encounter (Signed)
Attempted to return call re: video assist interpreter.  No answer but left message that we were returning her call and our number.

## 2018-08-29 NOTE — Telephone Encounter (Signed)
Well the psychiatrist we dont call for anyway and I dont know of any pcp's who are taking new pt's right now because of the pandemic Latasha Parker

## 2018-08-30 NOTE — Telephone Encounter (Signed)
Called and spoke with pt via sign language interpreter line stating to her that new PCP appt are on hold due to the pandemic and not sure when she will be able to be set up with a PCP. Also stated to her that she would have to be the one to contact psychiatrist and I have a list that I could mail to her. Pt expressed understanding and stated to mail her the list. I verified pt's mailing address that we have on file and have placed the list in the mail for pt.    Pt is wanting to have more information about why her xanax quantity was lowered as she stated at one point she had been receiving a quantity of 90tabs but then the quantity decreased to 45 tabs and the last quantity was then decreased to 30 tabs. Pt is requesting to have the quantity increased for her xanax as she has been having problems with her anxiety.  Pt is wanting her med to be changed to where she has it more often.  Pt said she is wanting VS to review this but I stated to pt that VS was currently working at the hospital due to pandemic but pt still insisted for this message to be sent to him for him to review.  Dr. Halford Chessman, please advise on this for pt. Thanks!

## 2018-09-04 MED ORDER — ALPRAZOLAM 1 MG PO TABS: 1.0000 mg | ORAL_TABLET | Freq: Three times a day (TID) | ORAL | 2 refills | Status: DC | PRN

## 2018-09-04 NOTE — Telephone Encounter (Signed)
Called pt via sign language interpreter line stating to her that VS has sent in a xanax Rx to pharmacy for her that should hopefully hold her over until she is able to get an appt with PCP or psychiatrist to further assess her anxiety. Pt expressed understanding. Nothing further needed.

## 2018-09-04 NOTE — Telephone Encounter (Signed)
I have sent refill for xanax 1 mg tid prn, #90 with 2 refills.  This should suffice for her to arrange appointment with PCP and psychiatry to further assess management of her anxiety.

## 2018-10-10 ENCOUNTER — Telehealth: Payer: Self-pay

## 2018-10-10 NOTE — Telephone Encounter (Signed)
Pt called stating that she has questions could someone give her a call.

## 2018-10-11 NOTE — Telephone Encounter (Signed)
Returned pt's call.  Pt did not pick up.  Left message advising pt that she was being contacted to return her call and requesting she contact the clinic if she continues to have questions or concerns.

## 2018-10-30 NOTE — Progress Notes (Deleted)
@Patient  ID: Latasha Parker, female    DOB: April 18, 1976, 43 y.o.   MRN: [id number]  No chief complaint on file.   Referring provider: No ref. provider found  HPI:  43 year old current everyday smoker followed in our office for COPD  PMH:  Smoker/ Smoking History: Current Everyday Smoker  Maintenance:  Flovent HFA Pt of: Dr. Halford Chessman  10/30/2018  - Visit   HPI  Tests:   PFT 01/21/14 >> borderline obstruction, normal lung volumes/diffusion, no BD response   FENO:  No results found for: NITRICOXIDE  PFT: PFT Results Latest Ref Rng & Units 01/21/2014  FVC-Pre L 4.78  FVC-Predicted Pre % 116  FVC-Post L 4.86  FVC-Predicted Post % 118  Pre FEV1/FVC % % 69  Post FEV1/FCV % % 71  FEV1-Pre L 3.29  FEV1-Predicted Pre % 97  FEV1-Post L 3.46  DLCO UNC% % 85  DLCO COR %Predicted % 78  TLC L 5.19  TLC % Predicted % 94  RV % Predicted % 19    Imaging: No results found.    Specialty Problems      Pulmonary Problems   COPD (chronic obstructive pulmonary disease) with chronic bronchitis ([place])    01/21/2014 PFTs - FEV1 of 97%, ratio 69, no significant bronchodilator response. FVC was normal. Diffusing capacity was normal. Mid-flows were decreased at 57% with a positive bronchodilator response.          Allergies  Allergen Reactions  . Tramadol Nausea And Vomiting  . Vicodin [Hydrocodone-Acetaminophen] Nausea And Vomiting    Immunization History  Administered Date(s) Administered  . Influenza Inj Mdck Quad Pf 02/22/2017  . Influenza,inj,Quad PF,6+ Mos 03/20/2014, 03/16/2015, 04/19/2016  . Pneumococcal Polysaccharide-23 08/13/2014    Past Medical History:  Diagnosis Date  . Anxiety   . Arthritis   . Blood transfusion without reported diagnosis 2015  . COPD (chronic obstructive pulmonary disease) ([place]) 2015  . Cyst of ovary    right  . Deaf   . Depression   . Female bladder prolapse 2019  . Hyperthyroidism   . Menorrhagia   . Neuromuscular disorder ([place]) 2009   . Pelvic pain   . SVD (spontaneous vaginal delivery)     Tobacco History: Social History   Tobacco Use  Smoking Status Current Every Day Smoker  . Packs/day: 0.50  . Years: 15.00  . Pack years: 7.50  . Types: Cigarettes  Smokeless Tobacco Never Used   Ready to quit: Not Answered Counseling given: Not Answered   Continue to not smoke  Outpatient Encounter Medications as of 10/31/2018  Medication Sig  . ALPRAZolam (XANAX) 1 MG tablet Take 1 tablet (1 mg total) by mouth 3 (three) times daily as needed for anxiety or sleep.  Marland Kitchen dicyclomine (BENTYL) 20 MG tablet Take 1 tablet (20 mg total) by mouth 2 (two) times daily.  Marland Kitchen FLOVENT HFA 44 MCG/ACT inhaler INHALE 2 PUFFS BY MOUTH TWICE A DAY (Patient taking differently: INHALE 2 PUFFS BY MOUTH TWICE A DAY AS NEEDED FOR SHORTNESS OF BREATH/WHEEZING)  . IRON PO Take 1 tablet by mouth daily.  . Multiple Vitamin (MULTIVITAMIN WITH MINERALS) TABS tablet Take 1 tablet by mouth daily.   Facility-Administered Encounter Medications as of 10/31/2018  Medication  . 0.9 %  sodium chloride infusion     Review of Systems  Review of Systems   Physical Exam  LMP 06/12/2014 (Exact Date)   Wt Readings from Last 5 Encounters:  11/27/17 160 lb (72.6 kg)  11/21/17 160 lb (72.6 kg)  10/23/17 166 lb 6.4 oz (75.5 kg)  09/20/17 157 lb 1.6 oz (71.3 kg)  08/24/17 165 lb (74.8 kg)     Physical Exam    Lab Results:  CBC    Component Value Date/Time   WBC 9.2 11/19/2017 1123   RBC 4.27 11/19/2017 1123   HGB 13.1 11/19/2017 1123   HGB 13.8 09/20/2017 1630   HCT 40.7 11/19/2017 1123   HCT 41.0 09/20/2017 1630   PLT 451 (H) 11/19/2017 1123   PLT 480 (H) 09/20/2017 1630   MCV 95.3 11/19/2017 1123   MCV 92 09/20/2017 1630   MCH 30.7 11/19/2017 1123   MCHC 32.2 11/19/2017 1123   RDW 14.3 11/19/2017 1123   RDW 15.0 09/20/2017 1630   LYMPHSABS 2.4 11/06/2014 0950   MONOABS 0.6 11/06/2014 0950   EOSABS 0.3 11/06/2014 0950   BASOSABS 0.1  11/06/2014 0950    BMET    Component Value Date/Time   NA 139 11/19/2017 1123   K 3.6 11/19/2017 1123   CL 106 11/19/2017 1123   CO2 25 11/19/2017 1123   GLUCOSE 85 11/19/2017 1123   BUN 7 11/19/2017 1123   CREATININE 0.70 11/19/2017 1123   CALCIUM 8.9 11/19/2017 1123   GFRNONAA >60 11/19/2017 1123   GFRAA >60 11/19/2017 1123    BNP No results found for: BNP  ProBNP No results found for: PROBNP    Assessment & Plan:   No problem-specific Assessment & Plan notes found for this encounter.    No follow-ups on file.   Lauraine Rinne, NP 10/30/2018   This appointment was *** minutes long with over 50% of the time in direct face-to-face patient care, assessment, plan of care, and follow-up.

## 2018-10-31 ENCOUNTER — Ambulatory Visit: Payer: Medicaid Other | Admitting: Pulmonary Disease

## 2018-12-19 ENCOUNTER — Telehealth: Payer: Self-pay | Admitting: Pulmonary Disease

## 2018-12-19 NOTE — Telephone Encounter (Signed)
Called pt using sign language interpreter. Pt is needing a refill of her xanax. Stated to pt that there was a note that she was to obtain a psychologist and once she was able to get one they were to take over refilling her xanax. Pt said that she has filled out all paperwork that was needed to be done and has been told by the psychology office that it could possibly be sometime in Aug/Sept prior to able to get an appt with a psychologist.  Pt wanted to know if VS would approve one more refill until she is able to have an appt with psychologist. Stated to pt that she has an appt scheduled 7/31 with VS and at that appt she could ask him if he would refill her xanax. Pt verbalized understanding. Nothing further needed.

## 2018-12-21 ENCOUNTER — Other Ambulatory Visit: Payer: Self-pay

## 2018-12-21 ENCOUNTER — Encounter: Payer: Self-pay | Admitting: Pulmonary Disease

## 2018-12-21 ENCOUNTER — Ambulatory Visit: Payer: Medicaid Other | Admitting: Pulmonary Disease

## 2018-12-21 VITALS — BP 110/70 | HR 72 | Ht 68.0 in | Wt 164.6 lb

## 2018-12-21 DIAGNOSIS — F419 Anxiety disorder, unspecified: Secondary | ICD-10-CM

## 2018-12-21 DIAGNOSIS — Z720 Tobacco use: Secondary | ICD-10-CM

## 2018-12-21 DIAGNOSIS — J449 Chronic obstructive pulmonary disease, unspecified: Secondary | ICD-10-CM | POA: Diagnosis not present

## 2018-12-21 MED ORDER — ALPRAZOLAM 1 MG PO TABS: 1.0000 mg | ORAL_TABLET | Freq: Three times a day (TID) | ORAL | 1 refills | Status: DC | PRN

## 2018-12-21 NOTE — Progress Notes (Signed)
Pulmonary, Critical Care, and Sleep Medicine  Chief Complaint  Patient presents with  . Follow-up    Would like to discuss PNE vaccine. Reports her cough and SOB has improved alot. Also needs a refill of xanax.     Constitutional:  BP 110/70   Pulse 72   Ht 5\' 8"  (1.727 m)   Wt 164 lb 9.6 oz (74.7 kg)   LMP 06/12/2014 (Exact Date)   SpO2 96%   BMI 25.03 kg/m   Past Medical History:  Fatty liver, Diverticulosis, Hyperthyroidism, Depression, Anxiety, OA  Brief Summary:  Latasha Parker is a 43 y.o. female smoker with COPD/chronic bronchitis.  Hx of deafness.  Interview conducted with sign language translator on a stick.  She has appointment with behavioral health schedule for September.  She has been under lots of stress related to issues with her daughter, but hopefully these will be worked out soon.  She has occasional cough.  Not having much sputum.  Not having wheeze, fever, chest pain, hemoptysis, or swelling.   Physical Exam:   Appearance - well kempt   ENMT - clear nasal mucosa, midline nasal  septum, no oral exudates, no LAN, trachea midline  Respiratory - normal chest wall, normal respiratory effort, no accessory muscle use, no wheeze/rales  CV - s1s2 regular rate and rhythm, no murmurs, no peripheral edema, radial pulses symmetric  GI - soft, non tender, no masses  Lymph - no adenopathy noted in neck and axillary areas  MSK - normal gait  Ext - no cyanosis, clubbing, or joint inflammation noted  Skin - no rashes, lesions, or ulcers  Neuro - normal strength, oriented x 3  Psych - normal mood and affect   Assessment/Plan:   COPD with chronic bronchitis. - flovent with prn albuterol - advised her to get flu shot later this fall  Tobacco abuse. - slowly trying to quit on her own  Anxiety. - prn xanax >> refilled for now - advised her that future refills for xanax will need to come through behavioral health   Patient Instructions   Follow up in 6 months    Chesley Mires, MD [place] Pager: [phone] 12/21/2018, 10:58 AM  Flow Sheet     Pulmonary tests:  PFT 01/21/14 >> borderline obstruction, normal lung volumes/diffusion, no BD response  Medications:   Allergies as of 12/21/2018      Reactions   Tramadol Nausea And Vomiting   Vicodin [hydrocodone-acetaminophen] Nausea And Vomiting      Medication List       Accurate as of December 21, 2018 10:58 AM. If you have any questions, ask your nurse or doctor.        ALPRAZolam 1 MG tablet Commonly known as: XANAX Take 1 tablet (1 mg total) by mouth 3 (three) times daily as needed for anxiety or sleep.   dicyclomine 20 MG tablet Commonly known as: BENTYL Take 1 tablet (20 mg total) by mouth 2 (two) times daily.   Flovent HFA 44 MCG/ACT inhaler Generic drug: fluticasone INHALE 2 PUFFS BY MOUTH TWICE A DAY What changed:   how much to take  how to take this  when to take this   IRON PO Take 1 tablet by mouth daily.   multivitamin with minerals Tabs tablet Take 1 tablet by mouth daily.       Past Surgical History:  She  has a past surgical history that includes ovary tumor; Foot surgery; Diagnostic laparoscopy (2003); Wisdom tooth extraction;  laparoscopy (N/A, 07/17/2012); Multiple extractions with alveoloplasty (N/A, 06/06/2014); Laparoscopic assisted vaginal hysterectomy (N/A, 08/12/2014); and Laparoscopic salpingo oophorectomy (Right, 08/12/2014).  Family History:  Her family history includes Breast cancer in her maternal grandmother and paternal aunt; Cancer in her father, maternal grandmother, and mother.  Social History:  She  reports that she has been smoking cigarettes. She has a 7.50 pack-year smoking history. She has never used smokeless tobacco. She reports that she does not drink alcohol or use drugs.

## 2018-12-21 NOTE — Patient Instructions (Signed)
Follow up in 6 months 

## 2019-01-21 ENCOUNTER — Telehealth: Payer: Self-pay | Admitting: Pulmonary Disease

## 2019-01-21 NOTE — Telephone Encounter (Signed)
Pt is requesting a refill of Alprazolam. VS are you willing to refill her medication again?  ATC pt, no answer. Left message for pt to call back.  Assessment/Plan:   COPD with chronic bronchitis. - flovent with prn albuterol - advised her to get flu shot later this fall  Tobacco abuse. - slowly trying to quit on her own  Anxiety. - prn xanax >> refilled for now - advised her that future refills for xanax will need to come through behavioral health   Patient Instructions  Follow up in 6 months    Chesley Mires, MD [place] Pager: [phone] 12/21/2018, 10:58 AM

## 2019-01-21 NOTE — Telephone Encounter (Signed)
Called and spoke with patient, via sign language interpreter.  Patient requested flu vaccine Wednesday.  Patient scheduled 01/23/19, at 0930, for flu vaccine.  Nothing further.

## 2019-01-23 ENCOUNTER — Ambulatory Visit: Payer: Medicaid Other

## 2019-01-23 MED ORDER — ALPRAZOLAM 1 MG PO TABS: 1.0000 mg | ORAL_TABLET | Freq: Three times a day (TID) | ORAL | 0 refills | Status: DC | PRN

## 2019-01-23 NOTE — Telephone Encounter (Signed)
I have called in the pt's prescription. Nothing further was needed.

## 2019-01-23 NOTE — Telephone Encounter (Signed)
Can send script for one month supply of alprazolam.  She is supposed to have appointment with behavioral health this month.  Further refills will need to come from behavioral health.

## 2019-01-30 ENCOUNTER — Ambulatory Visit: Payer: Medicaid Other

## 2019-02-06 ENCOUNTER — Telehealth: Payer: Self-pay | Admitting: Pulmonary Disease

## 2019-02-06 NOTE — Telephone Encounter (Signed)
Spoke with the pt via sign language interpreter and notified ok for pneumovax and flu shot at her appt 02/08/19  Nothing further needed

## 2019-02-06 NOTE — Telephone Encounter (Signed)
Okay for booster for pneumovax and flu shot.

## 2019-02-06 NOTE — Telephone Encounter (Signed)
Call returned to patient, says she would like to get a flu shot. I confirmed that she has never had a reaction to flu shot and she denies an allergy to eggs. Also requesting to know about her PNE vaccine. Pneumovax 23 is recommended every 5 years. She received her last one 2016. I advised that I would speak with her provider first.   VS please advise, do you think patient should get her PNE 23, last one was 2016. Thanks. Patient has an appt 02/08/2019.

## 2019-02-08 ENCOUNTER — Ambulatory Visit: Payer: Medicaid Other

## 2019-02-22 ENCOUNTER — Telehealth: Payer: Self-pay | Admitting: Pulmonary Disease

## 2019-02-22 ENCOUNTER — Other Ambulatory Visit: Payer: Self-pay | Admitting: Pulmonary Disease

## 2019-02-22 NOTE — Telephone Encounter (Signed)
Spoke with the pt via interpreter service  She states that she is needing one more refill on her alprazolam  She gets 1 mg tidprn # 90 and this was last given by Dr Halford Chessman on 01/23/2019  Last ov with Dr Halford Chessman states:  Anxiety. - prn xanax >> refilled for now - advised her that future refills for xanax will need to come through behavioral health  Per pt- she is scheduled a zoom appt with a phycologist on 03/09/2019   She is needing 1 more refill to last until [facility] [address]   Tammy, please advise thanks

## 2019-02-22 NOTE — Telephone Encounter (Signed)
Pt is calling wanting to be scheduled for a flu shot and PNA shot.  Dr. Halford Chessman - please advise if pt is due for a PNA vaccine. Thanks!

## 2019-02-25 ENCOUNTER — Telehealth: Payer: Self-pay | Admitting: Pulmonary Disease

## 2019-02-25 NOTE — Telephone Encounter (Signed)
She received pneumovax in 2016 and therefore doesn't another one at this time.  She can be scheduled for flu shot.

## 2019-02-25 NOTE — Telephone Encounter (Signed)
Will route to injection pool so they can schedule the flu shot

## 2019-02-25 NOTE — Telephone Encounter (Signed)
Pt returning call.  [phone]

## 2019-02-25 NOTE — Telephone Encounter (Signed)
msg not answered Friday 02/22/19 so forwarding to APP of the day today, Judson Roch, please advise thanks

## 2019-02-25 NOTE — Telephone Encounter (Signed)
Please direct this to Dr. Halford Chessman. Thank you

## 2019-02-25 NOTE — Telephone Encounter (Signed)
Call returned to patient via interpreter (pt is deaf), she does not have a psychologist appt, she reports she will be calling to get that appt. I made her aware VS is not in clinic this week.   Requesting refill of Xanax.   Last filled: 01/23/2019  Take 1 tablet (1 mg total) by mouth 3 (three) times daily as needed for anxiety or sleep.   VS please advise. Thanks.

## 2019-02-25 NOTE — Telephone Encounter (Signed)
Dr. Sood - please advise. Thanks. 

## 2019-02-26 NOTE — Telephone Encounter (Signed)
Pt returning call regarding flu shot appt and medication sent to pharmacy.  [phone].

## 2019-02-26 NOTE — Telephone Encounter (Signed)
LMTCB x1 for pt via sign language interpreter.

## 2019-02-26 NOTE — Telephone Encounter (Signed)
ATC Patient via sign language interpreter.  LM to call back when available.

## 2019-02-28 NOTE — Telephone Encounter (Signed)
Awaiting response from VS before calling pt back.

## 2019-02-28 NOTE — Telephone Encounter (Signed)
LMTCB x2 for pt via sign language interpreter.

## 2019-03-01 NOTE — Telephone Encounter (Signed)
There is another message on this pt, we are awaiting response from VS.

## 2019-03-04 NOTE — Telephone Encounter (Signed)
She has been informed on multiple occasions that she needs to find a PCP or behavioral health specialist to assume management of her anxiety.  This is the absolute last time I will be refilling this prescription for her.  If she needs help getting set up with a PCP or behavioral health specialist, then please let me know.  Can give her one month supply with one refill.  This should be sufficient time for her to get appointment with PCP or behavioral health specialist.

## 2019-03-04 NOTE — Telephone Encounter (Signed)
When I called the pharm to give the okay on this refill they informed me she just picked up refill from Dr Halford Chessman today and now she is out of refills  Since Dr Halford Chessman had instructed to give 1 month supply with 1 refill I advised pharmacy to allow 1 refill on this prescription   Pt aware of response per Dr Halford Chessman  I have also made her an appt for her flu vaccine

## 2019-03-04 NOTE — Telephone Encounter (Signed)
Dr. Halford Chessman will be back in office on 03/07/2019.

## 2019-03-07 ENCOUNTER — Telehealth: Payer: Self-pay | Admitting: Pulmonary Disease

## 2019-03-07 ENCOUNTER — Ambulatory Visit: Payer: Medicaid Other

## 2019-03-08 NOTE — Telephone Encounter (Signed)
Called Patient through interpreter to schedule flu vaccine. LMTCB to schedule flu vaccine.

## 2019-03-11 NOTE — Telephone Encounter (Signed)
Called Patient through sign language interpreter to schedule flu vaccine. LMTCB to schedule flu vaccine.

## 2019-03-12 NOTE — Telephone Encounter (Signed)
See telephone encounter 02/25/2019 for further updates. Will close this encounter out to prevent duplication. Nothing further needed at this time.

## 2019-03-14 NOTE — Telephone Encounter (Signed)
LMTCB x3 for pt through sign language interpreter. We have attempted to contact pt several times with no success or call back from pt. Per triage protocol, message will be closed.

## 2019-03-29 ENCOUNTER — Telehealth: Payer: Self-pay | Admitting: Pulmonary Disease

## 2019-03-29 NOTE — Telephone Encounter (Signed)
Left message via interpreter for patient to call back.

## 2019-04-01 NOTE — Telephone Encounter (Signed)
Spoke with patient through interpreter service, states that she needs refills on Alprazolam 1mg  tablet.  States that she was referred to therapy but they only do verbal therapy, not medications.  Pt expressed lots of frustration about this.  Per chart, Aaron Edelman had referred pt to Psych for anxiety in March 2020 for continuation of this medication.  Pt thought that they would manage this med for her as well, but they are refusing and telling her to reach back out to [place] for refills.    [facility] [address]    VS please advise.  Thanks!

## 2019-04-01 NOTE — Telephone Encounter (Signed)
LMTCB x2 for pt via sign language interpreting service.

## 2019-04-01 NOTE — Telephone Encounter (Signed)
Pt returning call.  [phone].  May leave message with interpreter.

## 2019-04-02 NOTE — Telephone Encounter (Signed)
Left message via interpreter for patient to call back.

## 2019-04-02 NOTE — Telephone Encounter (Signed)
She needs to have an appointment with me or NP to discuss this in more detail.  If psychiatry is not willing to manage this medication for her anxiety, then we will need to discuss plan of gradually tapering her off xanax.

## 2019-04-04 NOTE — Telephone Encounter (Signed)
LM x 2

## 2019-04-05 NOTE — Telephone Encounter (Signed)
I called pt but there was no answer. I left a message with the interpreter service to have pt call us back.

## 2019-04-08 NOTE — Telephone Encounter (Signed)
Call made to patient via interpreter, confirmed DOB. Appt made. Nothing further needed at this time.

## 2019-04-10 ENCOUNTER — Encounter: Payer: Self-pay | Admitting: Pulmonary Disease

## 2019-04-10 ENCOUNTER — Ambulatory Visit: Payer: Medicaid Other | Admitting: Pulmonary Disease

## 2019-04-10 ENCOUNTER — Other Ambulatory Visit: Payer: Self-pay

## 2019-04-10 VITALS — BP 108/66 | HR 74 | Temp 97.7°F | Ht 67.05 in | Wt 162.8 lb

## 2019-04-10 DIAGNOSIS — Z23 Encounter for immunization: Secondary | ICD-10-CM | POA: Diagnosis not present

## 2019-04-10 DIAGNOSIS — F329 Major depressive disorder, single episode, unspecified: Secondary | ICD-10-CM

## 2019-04-10 DIAGNOSIS — F419 Anxiety disorder, unspecified: Secondary | ICD-10-CM

## 2019-04-10 MED ORDER — ALPRAZOLAM 0.5 MG PO TABS: 0.5000 mg | ORAL_TABLET | Freq: Three times a day (TID) | ORAL | 3 refills | Status: DC | PRN

## 2019-04-10 MED ORDER — BUPROPION HCL ER (SR) 150 MG PO TB12: 150.0000 mg | ORAL_TABLET | Freq: Two times a day (BID) | ORAL | 3 refills | Status: DC

## 2019-04-10 NOTE — Patient Instructions (Signed)
Bupropion 150 mg pill twice per day Alprazolam (xanax) 0.5 mg every 8 hours as needed for anxiety Will arrange for appointment with psychiatrist  Follow up in 6 months

## 2019-04-10 NOTE — Progress Notes (Signed)
[facility], Critical Care, and Sleep Medicine  Chief Complaint  Patient presents with  . Follow-up    Constitutional:  BP 108/66 (BP Location: Right Arm, Cuff Size: Normal)   Pulse 74   Temp 97.7 F (36.5 C) (Temporal)   Ht 5' 7.05" (1.703 m) Comment: with shoes  Wt 162 lb 12.8 oz (73.8 kg)   LMP 06/12/2014 (Exact Date)   SpO2 95%   BMI 25.46 kg/m   Past Medical History:  Fatty liver, Diverticulosis, Hyperthyroidism, Depression, Anxiety, OA  Brief Summary:  Latasha Parker is a 43 y.o. female smoker with COPD/chronic bronchitis.  Hx of deafness.  Interview conducted with sign Ecologist.  She had referral for psychiatry placed in March, but never got a call to get this set up.  She has been working with a Scientist, water quality named Santiago Glad in [employer] [place].    She continues to use xanax.  She gets shakes and feels queasy if she misses a dose of xanax.  Her daughter is living in a dorm a school.  Having some time to her self has helped with her mood.  She still smokes cigarettes.    Not having cough, wheeze, or sputum.   Physical Exam:   Appearance - well kempt   ENMT - no sinus tenderness, no nasal discharge, no oral exudate  Neck - no masses, trachea midline, no thyromegaly, no elevation in JVP  Respiratory - normal appearance of chest wall, normal respiratory effort w/o accessory muscle use, no dullness on percussion, no wheezing or rales  CV - s1s2 regular rate and rhythm, no murmurs, no peripheral edema, radial pulses symmetric  GI - soft, non tender  Lymph - no adenopathy noted in neck and axillary areas  MSK - normal gait  Ext - no cyanosis, clubbing, or joint inflammation noted  Skin - no rashes, lesions, or ulcers  Neuro - normal strength, oriented x 3  Psych - normal mood and affect   Assessment/Plan:   COPD with chronic bronchitis. - continue flovent with prn albuterol - flu shot today  Tobacco abuse. - will have her try  bupropion  Anxiety with depression. - seeing behavioral therapist in [facility] - not sure why psychiatry referral wasn't scheduled in March; will send referral in again - will change xanax to 0.5 mg tid prn, and try to gradually wean off - bupropion should help with her mood to some degree also   Patient Instructions  Bupropion 150 mg pill twice per day Alprazolam (xanax) 0.5 mg every 8 hours as needed for anxiety Will arrange for appointment with psychiatrist  Follow up in 6 months   A total of  26 minutes were spent face to face with the patient and more than half of that time involved counseling or coordination of care.   Chesley Mires, MD [place] Pager: [phone] 04/10/2019, 10:21 AM  Flow Sheet     Pulmonary tests:  PFT 01/21/14 >> borderline obstruction, normal lung volumes/diffusion, no BD response  Medications:   Allergies as of 04/10/2019      Reactions   Tramadol Nausea And Vomiting   Vicodin [hydrocodone-acetaminophen] Nausea And Vomiting      Medication List       Accurate as of April 10, 2019 10:21 AM. If you have any questions, ask your nurse or doctor.        STOP taking these medications   dicyclomine 20 MG tablet Commonly known as: BENTYL Stopped by: Chesley Mires, MD  TAKE these medications   ALPRAZolam 0.5 MG tablet Commonly known as: XANAX Take 1 tablet (0.5 mg total) by mouth 3 (three) times daily as needed for anxiety or sleep. What changed:   medication strength  how much to take Changed by: Chesley Mires, MD   buPROPion 150 MG 12 hr tablet Commonly known as: WELLBUTRIN SR Take 1 tablet (150 mg total) by mouth 2 (two) times daily. Started by: Chesley Mires, MD   Flovent HFA 44 MCG/ACT inhaler Generic drug: fluticasone INHALE 2 PUFFS BY MOUTH TWICE A DAY What changed:   how much to take  how to take this  when to take this   IRON PO Take 1 tablet by mouth daily.   multivitamin with  minerals Tabs tablet Take 1 tablet by mouth daily.       Past Surgical History:  She  has a past surgical history that includes ovary tumor; Foot surgery; Diagnostic laparoscopy (2003); Wisdom tooth extraction; laparoscopy (N/A, 07/17/2012); Multiple extractions with alveoloplasty (N/A, 06/06/2014); Laparoscopic assisted vaginal hysterectomy (N/A, 08/12/2014); and Laparoscopic salpingo oophorectomy (Right, 08/12/2014).  Family History:  Her family history includes Breast cancer in her maternal grandmother and paternal aunt; Cancer in her father, maternal grandmother, and mother.  Social History:  She  reports that she has been smoking cigarettes. She has a 7.50 pack-year smoking history. She has never used smokeless tobacco. She reports that she does not drink alcohol or use drugs.

## 2019-04-25 ENCOUNTER — Telehealth: Payer: Self-pay | Admitting: Pulmonary Disease

## 2019-04-25 NOTE — Telephone Encounter (Signed)
On 04/10/19 she was given 1 month supply with 3 additional refills so for 1- it's too soon to refill and 2- she still has refills  LMTCB

## 2019-04-25 NOTE — Telephone Encounter (Signed)
Pt returning call.  [phone]

## 2019-04-25 NOTE — Telephone Encounter (Signed)
LMTCB again

## 2019-04-26 NOTE — Telephone Encounter (Signed)
Pt returning call and can be reached @ [phone].Latasha Parker

## 2019-04-26 NOTE — Telephone Encounter (Signed)
ATC patient unable to reach LM to call back office (x1)  

## 2019-04-29 NOTE — Telephone Encounter (Signed)
Medication was not phoned in.  [facility] Dr[facility] Juanetta Gosling AVS on 04/10/19.  Called patient. Let her know the medication was now called in.  Nothing further needed at this time.

## 2019-04-29 NOTE — Telephone Encounter (Signed)
LMTCB again

## 2019-04-29 NOTE — Telephone Encounter (Signed)
PT returned call

## 2019-05-08 IMAGING — CT CT ABD-PELV W/ CM
2 of 5 series · 17 of 46 positions shown, 19 images · IV contrast (omnipaque)
Comparison: 11/06/2014

CLINICAL DATA: New rectal bleeding for 2 days

EXAM:
CT ABDOMEN AND PELVIS WITH CONTRAST
TECHNIQUE: Multidetector CT imaging of the abdomen and pelvis was performed
using the standard protocol following bolus administration of
intravenous contrast.
CONTRAST:  100mL OMNIPAQUE IOHEXOL 300 MG/ML  SOLN

[Series 3: a/p w/ 5mm · axial · 0.81mm/px · z∈[+1039,+1424]mm · 14 of 87 slices shown, 16 images]
[im 5/87  soft-tissue]
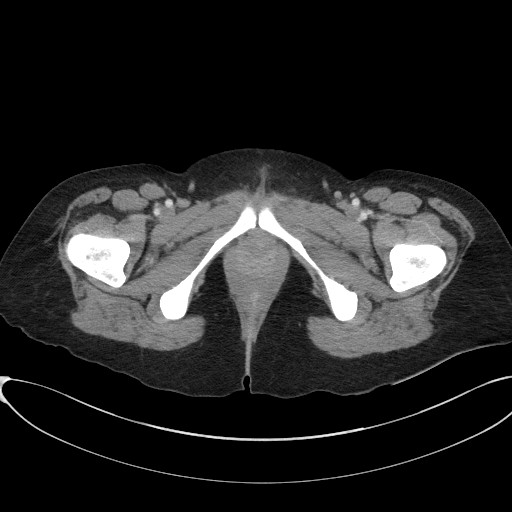
[im 5/87  bone]
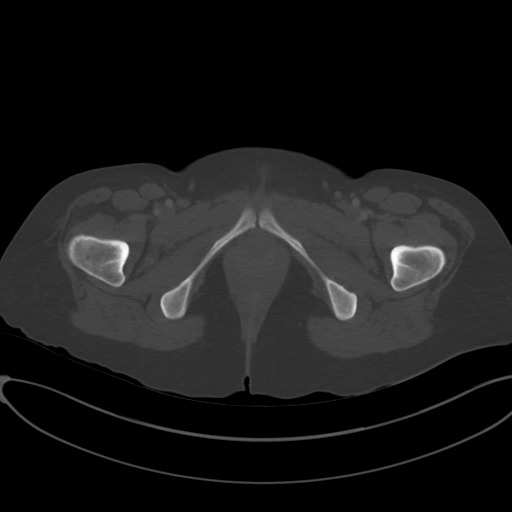
[im 13/87  soft-tissue]
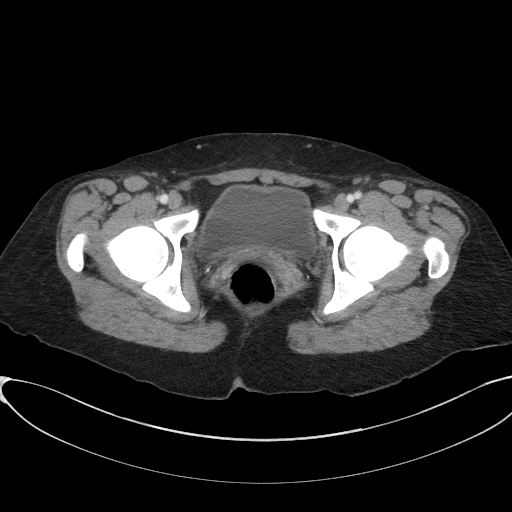
[im 18/87  soft-tissue]
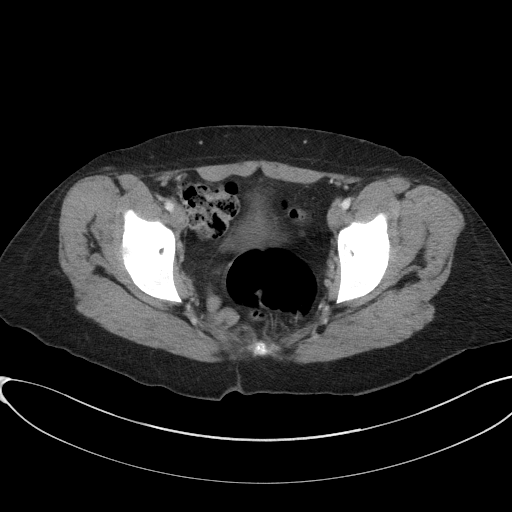
[im 22/87  soft-tissue]
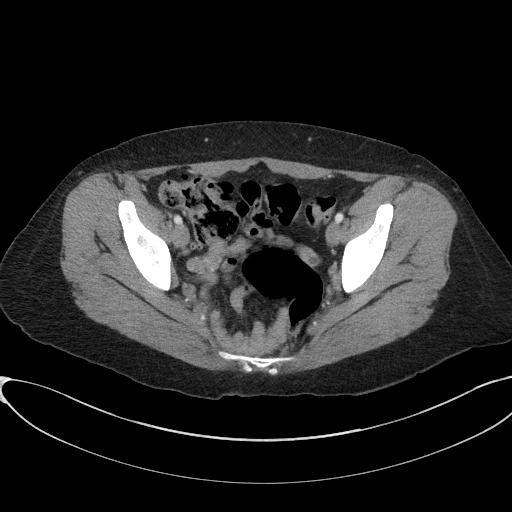
[im 31/87  soft-tissue]
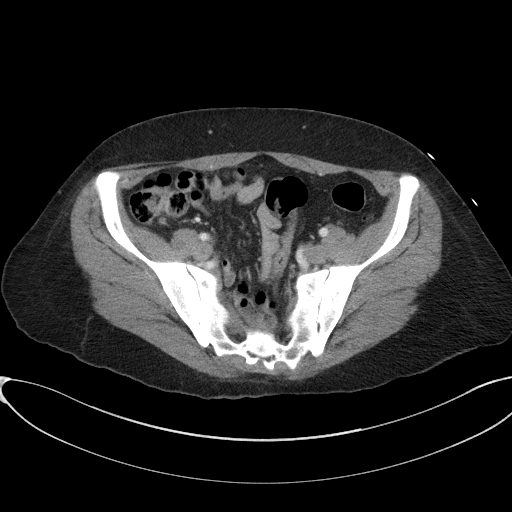
[im 35/87  soft-tissue]
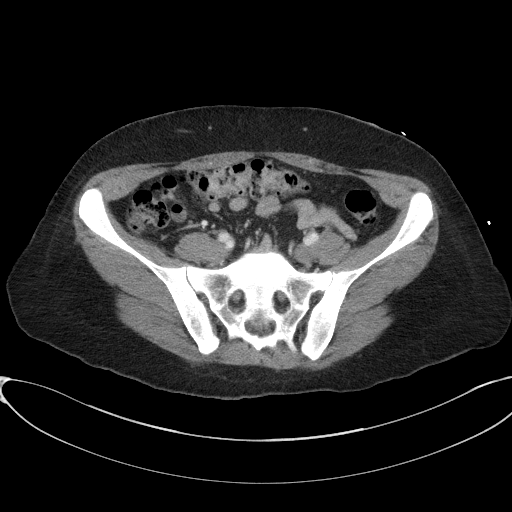
[im 39/87  soft-tissue]
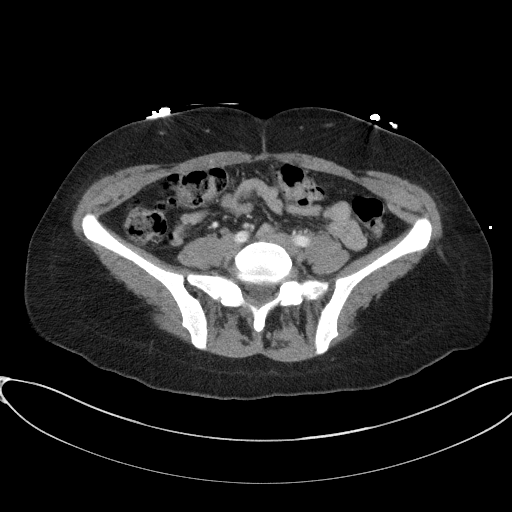
[im 48/87  soft-tissue]
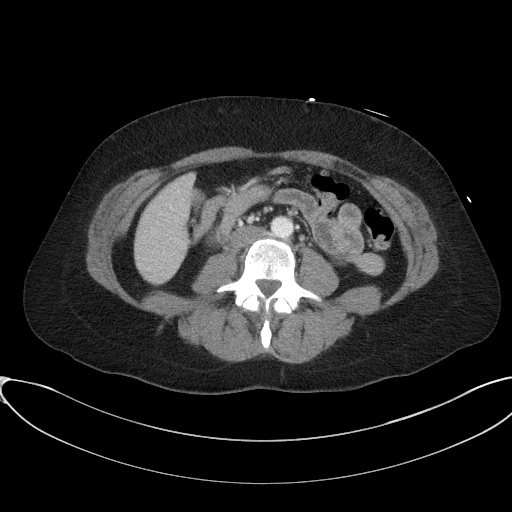
[im 52/87  soft-tissue]
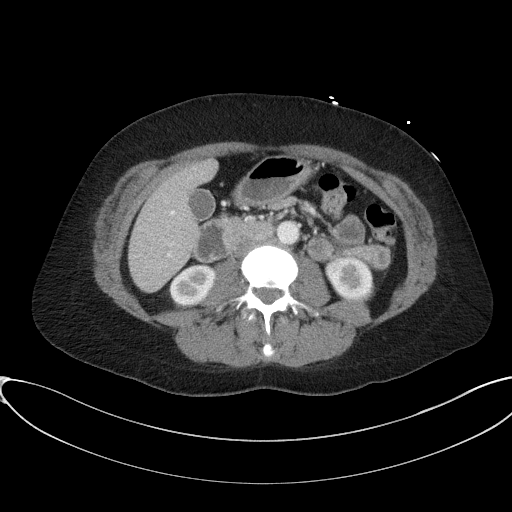
[im 52/87  bone]
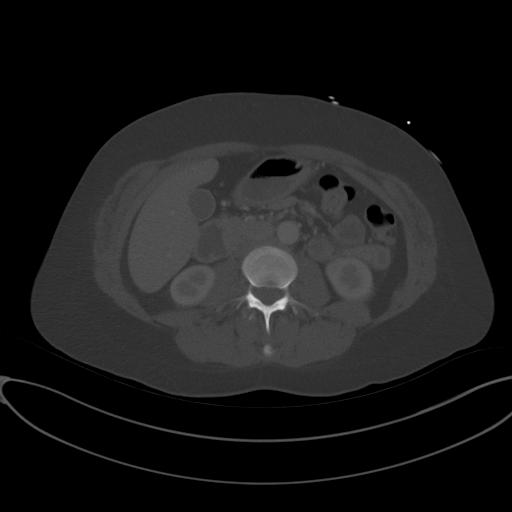
[im 56/87  soft-tissue]
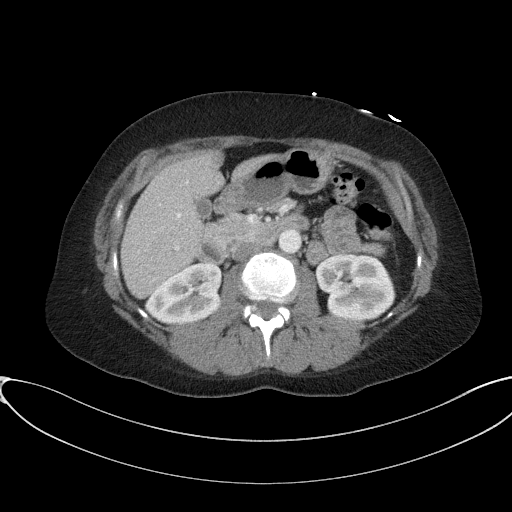
[im 65/87  soft-tissue]
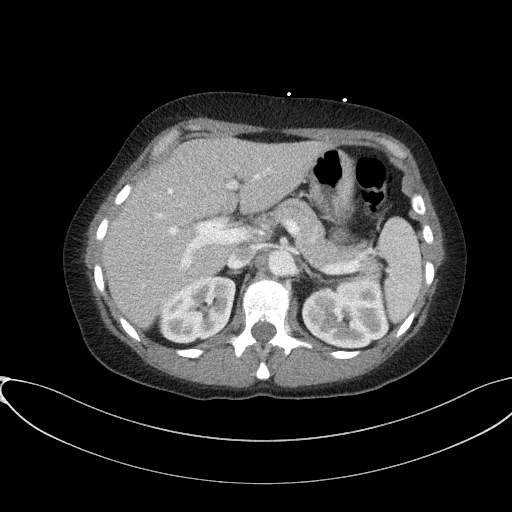
[im 69/87  soft-tissue]
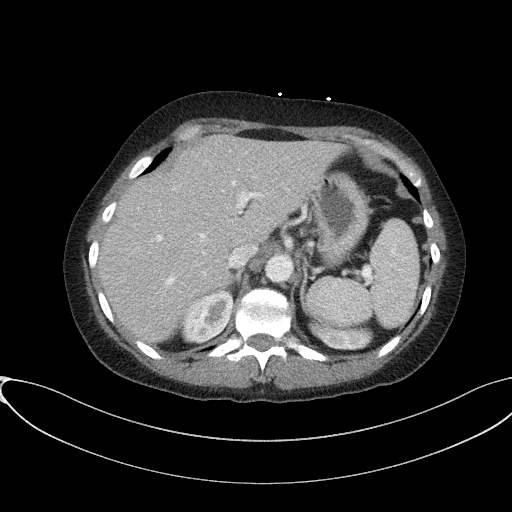
[im 74/87  soft-tissue]
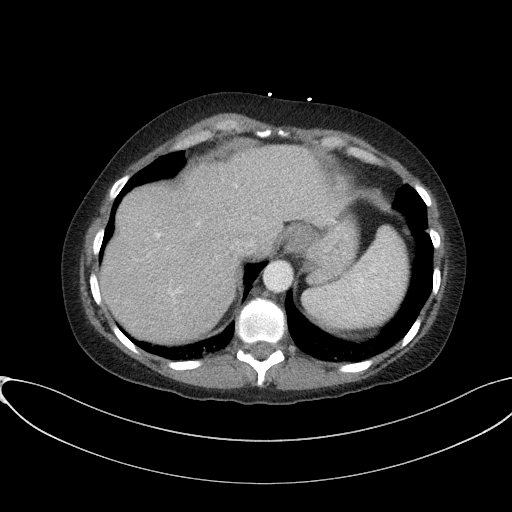
[im 82/87  soft-tissue]
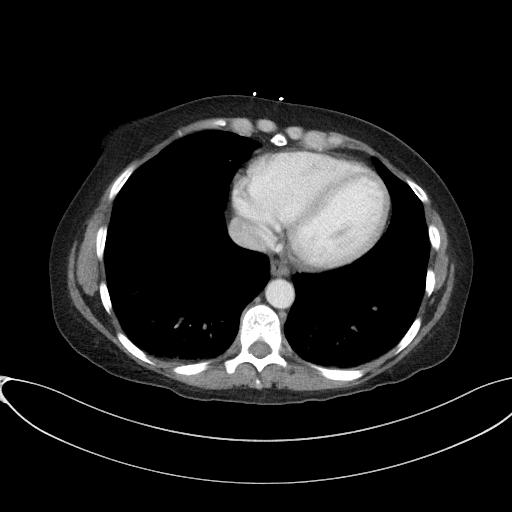

[Series 6: a/p w/ cor · coronal · 0.85mm/px · 3 of 148 slices shown]
[im 50/148  soft-tissue]
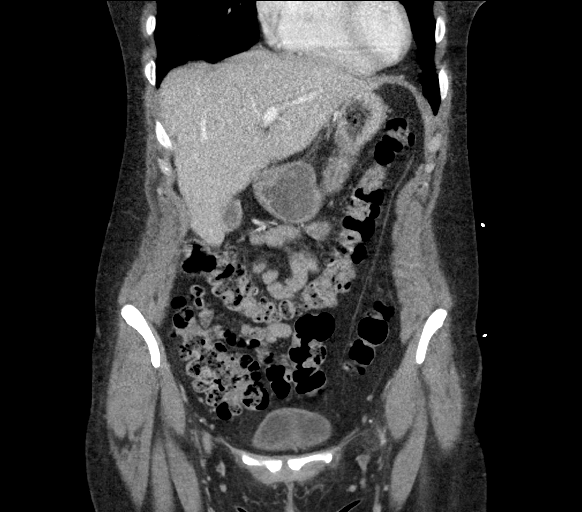
[im 66/148  soft-tissue]
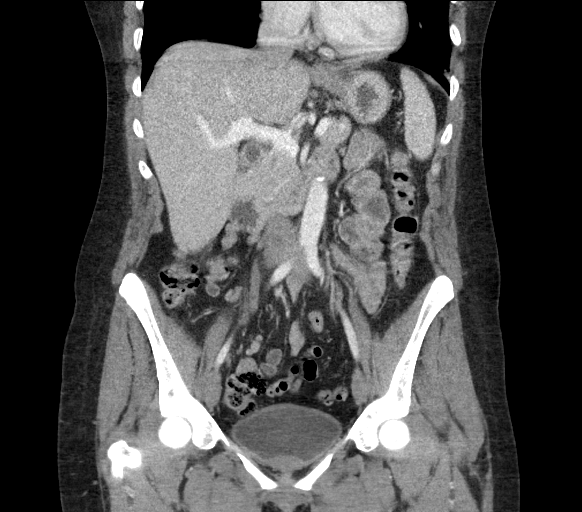
[im 82/148  soft-tissue]
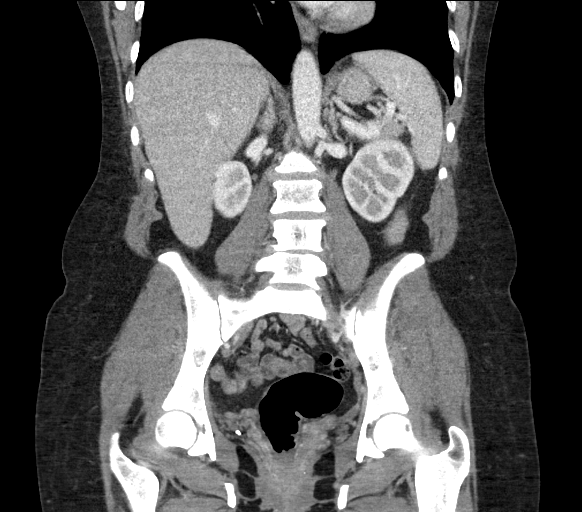

[17 of 46 positions shown; findings below may reference images not displayed]

FINDINGS: Lower chest: No acute abnormality.

Hepatobiliary: Mild fatty infiltration of the liver is noted. The
gallbladder is within normal limits.

Pancreas: Unremarkable. No pancreatic ductal dilatation or
surrounding inflammatory changes.

Spleen: Within normal limits.

Adrenals/Urinary Tract: The adrenal glands are within normal limits.
The kidneys are well visualize with a normal enhancement pattern. No
renal calculi or obstructive changes are seen. The bladder is
unremarkable.

Stomach/Bowel: Diverticular change of the colon is noted without
evidence of diverticulitis. The appendix is within normal limits. No
obstructive or inflammatory changes are seen.

Vascular/Lymphatic: No significant vascular findings are present. No
enlarged abdominal or pelvic lymph nodes.

Reproductive: Status post hysterectomy. No adnexal masses.

Other: No abdominal wall hernia or abnormality. No abdominopelvic
ascites.

Musculoskeletal: No acute or significant osseous findings.
IMPRESSION: Diverticular change without evidence of diverticulitis.

Fatty liver.

No other focal abnormality is noted.

## 2019-08-26 ENCOUNTER — Telehealth: Payer: Self-pay | Admitting: Pulmonary Disease

## 2019-08-26 NOTE — Telephone Encounter (Signed)
Did she f/u with psych? LMTCB x 1

## 2019-08-26 NOTE — Telephone Encounter (Signed)
Pt called back. She is requesting a refill of Alprazolam 0.5mg .  Last filled on 04/10/2019 for #90 with 3 refills by Dr. Halford Chessman.  Pt last seen on 04/10/2019 by Dr. Halford Chessman and has an upcoming appt with 10/08/2019 with VS.   Pt states she has tried to find a psychiatrist but hasnt had any luck.  Dr. Halford Chessman please advise. Thanks.

## 2019-08-27 MED ORDER — ALPRAZOLAM 0.5 MG PO TABS: 0.5000 mg | ORAL_TABLET | Freq: Three times a day (TID) | ORAL | 3 refills | Status: DC | PRN

## 2019-08-27 NOTE — Telephone Encounter (Signed)
Called and left detailed msg via interpreter service that the refill was sent.

## 2019-08-27 NOTE — Telephone Encounter (Signed)
Pt calling back about the status on her medication refill. Pt can be reached at [phone]. If she does not answer you can leave a detailed message.

## 2019-08-27 NOTE — Telephone Encounter (Signed)
We are still waiting on a response from Dr. Halford Chessman.

## 2019-08-27 NOTE — Telephone Encounter (Signed)
Refill sent.

## 2019-10-08 ENCOUNTER — Ambulatory Visit: Payer: Medicaid Other | Admitting: Pulmonary Disease

## 2019-12-02 ENCOUNTER — Ambulatory Visit: Payer: Medicaid Other | Admitting: Obstetrics and Gynecology

## 2019-12-23 ENCOUNTER — Telehealth: Payer: Self-pay | Admitting: Pulmonary Disease

## 2019-12-23 MED ORDER — ALPRAZOLAM 0.5 MG PO TABS: 0.5000 mg | ORAL_TABLET | Freq: Three times a day (TID) | ORAL | 0 refills | Status: DC | PRN

## 2019-12-23 NOTE — Telephone Encounter (Signed)
Called patient, informed her that we would forward her request for the refill of alprazolam to the doctor on call. Alprazolam 0.5 mg, last filled 08/27/2019, last OV 04/10/2019. Please send/advise.

## 2019-12-23 NOTE — Telephone Encounter (Signed)
Called pt via sign language interpreter letting her know that her alprazolam  Has been refilled and she verbalized understanding. Nothing further needed.

## 2019-12-23 NOTE — Telephone Encounter (Signed)
Done x one month only since I am not the regular provider

## 2019-12-24 ENCOUNTER — Other Ambulatory Visit: Payer: Self-pay

## 2019-12-24 MED ORDER — BUPROPION HCL ER (SR) 150 MG PO TB12: 150.0000 mg | ORAL_TABLET | Freq: Two times a day (BID) | ORAL | 3 refills | Status: DC

## 2020-01-28 ENCOUNTER — Other Ambulatory Visit: Payer: Self-pay | Admitting: Internal Medicine

## 2020-01-28 NOTE — Telephone Encounter (Signed)
Pt calling to get a refill on alprazolam. Pt can be reached at [phone]

## 2020-01-28 NOTE — Telephone Encounter (Signed)
Pt is requesting a refill of the alprazolam.  Last filled on 8/2 by [facility] for #90.    Pt last seen on 04/10/2019 by VS Next pending appt is 02/18/20 with VS.    Please advise on refill. Thanks

## 2020-01-28 NOTE — Telephone Encounter (Signed)
VS please advise on refill.

## 2020-01-28 NOTE — Telephone Encounter (Signed)
If Dr Halford Chessman not available then ok to Give one month supply and all further refills per Dr Halford Chessman  - if he's available I would defer to him.

## 2020-01-30 ENCOUNTER — Telehealth: Payer: Self-pay | Admitting: Pulmonary Disease

## 2020-01-30 ENCOUNTER — Other Ambulatory Visit: Payer: Self-pay | Admitting: Internal Medicine

## 2020-01-30 NOTE — Telephone Encounter (Signed)
Called and spoke with pt letting her know that we were waiting for VS to review this and once he responded we would let her know what he said. Pt verbalized understanding.

## 2020-01-30 NOTE — Telephone Encounter (Signed)
Pt calling in again to check on refill - please advise

## 2020-01-30 NOTE — Telephone Encounter (Signed)
Pt last seen at office since 04/10/2019. Dr. Halford Chessman, please advise if you are okay refilling pt's alprazolam sending it to [facility] off [address].

## 2020-01-31 NOTE — Telephone Encounter (Signed)
Okay to send refill. 

## 2020-01-31 NOTE — Telephone Encounter (Signed)
Spoke with patient regarding prior message. Patient stated she did pick up her medication Xanax 0.5mg  from her [place]. Patient does have a appt on 02/18/20 with Dr.Sood. Patient is wanting refill until her next appt.  Dr.Sood can you please advise.  Thank you

## 2020-01-31 NOTE — Telephone Encounter (Signed)
There is a phone note from 12/23/19 that shows Dr. Melvyn Novas authorizing refill for 1 month.  Please confirm whether this prescription was sent.

## 2020-01-31 NOTE — Telephone Encounter (Signed)
I have pended the order.   Dr. Halford Chessman, since we can't call in Xanax anymore, can you please sign the order. Thank you!

## 2020-02-04 ENCOUNTER — Other Ambulatory Visit: Payer: Self-pay | Admitting: Internal Medicine

## 2020-02-04 NOTE — Telephone Encounter (Signed)
I was finally able to find an imprivata device that worked in [facility].  Script sent.

## 2020-02-04 NOTE — Telephone Encounter (Signed)
Patient checking on status of Xanax refill. Patient phone number is [phone].

## 2020-02-04 NOTE — Telephone Encounter (Signed)
Pt aware of rx refill.  Nothing further needed at this time- will close encounter.

## 2020-02-04 NOTE — Telephone Encounter (Signed)
Pt is requesting refill on xanax 0.5 mg next ov 02/18/20

## 2020-02-18 ENCOUNTER — Ambulatory Visit: Payer: Medicaid Other | Admitting: Pulmonary Disease

## 2020-03-03 ENCOUNTER — Telehealth: Payer: Self-pay | Admitting: Pulmonary Disease

## 2020-03-03 NOTE — Telephone Encounter (Signed)
Called pt and spoke with pt via sign language interpreter. Pt is requesting a refill of her Xanax 0.5mg . Dr. Halford Chessman, please advise if you are okay sending refill of med to pharmacy for pt. [facility] [address] in [place].

## 2020-03-04 MED ORDER — ALPRAZOLAM 0.25 MG PO TABS: 0.2500 mg | ORAL_TABLET | Freq: Every evening | ORAL | 3 refills | Status: DC | PRN

## 2020-03-04 NOTE — Telephone Encounter (Signed)
Spoke with patient who states she had a question about her Xanax RX. Informed patient that Dr. Halford Chessman sent in prescription but at a lower dose. She was on 0.5mg  and he sent in 0.25mg . Patient states that she feels like he should have waited until her appointment with him next month but that she isn't going to argue. Expressed understanding. Nothing further needed at this time.

## 2020-03-04 NOTE — Telephone Encounter (Signed)
Called the pt via sign language interpreter  Advised pt of response from Dr Carolann Littler for Dr Halford Chessman- she has not been seen by behavioral health and states she is actively looking She states that she gets turned down everywhere b/c nobody is taking new pt's now   She has f/u with Dr Halford Chessman on 04/08/20

## 2020-03-04 NOTE — Telephone Encounter (Signed)
Please let her know that I set refill for xanax, but at lower dose because I want to try to transition her off use of xanax.  Please confirm whether she has been able to schedule follow up with behavioral health to help manage her anxiety.

## 2020-03-26 ENCOUNTER — Ambulatory Visit: Payer: Medicaid Other | Admitting: Obstetrics and Gynecology

## 2020-03-31 ENCOUNTER — Telehealth: Payer: Self-pay | Admitting: Pulmonary Disease

## 2020-03-31 NOTE — Telephone Encounter (Signed)
Spoke with pt through an interpreter, states that since decreasing from her alprazolam 0.5mg  to 0.25mg  she is experiencing insomnia, body aches, and intermittent muscle stiffness- compared this to a full body cramp.   Pt denies any other med changes, fever, dyspnea, chills.  Requesting additional recs.  VS please advise.  Thanks!

## 2020-04-01 NOTE — Telephone Encounter (Signed)
Contacted patient via a sign language interpreter, advised patient that Dr. Halford Chessman will not make any further adjustments with her medication prior to her appointment with him next week, 04/08/2020.  She verbalized understanding.  She wanted to make sure she would have an interpreter during her visit.  I advised her that we would make sure she had an interpreter.  I called [phone] and spoke with Janett Billow, she stated they contact the agency 7 days prior to the appointment so the patient will have an interpreter.  Nothing further needed.

## 2020-04-01 NOTE — Telephone Encounter (Signed)
ATC patient left message via interpreter to call back to make an appointment with our office.  Patient will have to see a NP as Dr. Halford Chessman is booked through this month.

## 2020-04-01 NOTE — Telephone Encounter (Signed)
Needs ROV 

## 2020-04-08 ENCOUNTER — Other Ambulatory Visit: Payer: Self-pay

## 2020-04-08 ENCOUNTER — Encounter: Payer: Self-pay | Admitting: Pulmonary Disease

## 2020-04-08 ENCOUNTER — Ambulatory Visit: Payer: Medicaid Other | Admitting: Pulmonary Disease

## 2020-04-08 VITALS — BP 126/74 | HR 82 | Temp 99.1°F | Ht 68.0 in | Wt 165.2 lb

## 2020-04-08 DIAGNOSIS — F419 Anxiety disorder, unspecified: Secondary | ICD-10-CM

## 2020-04-08 DIAGNOSIS — Z720 Tobacco use: Secondary | ICD-10-CM

## 2020-04-08 DIAGNOSIS — J449 Chronic obstructive pulmonary disease, unspecified: Secondary | ICD-10-CM

## 2020-04-08 DIAGNOSIS — Z0000 Encounter for general adult medical examination without abnormal findings: Secondary | ICD-10-CM

## 2020-04-08 DIAGNOSIS — J4489 Other specified chronic obstructive pulmonary disease: Secondary | ICD-10-CM

## 2020-04-08 DIAGNOSIS — F32A Depression, unspecified: Secondary | ICD-10-CM

## 2020-04-08 NOTE — Patient Instructions (Signed)
Will arrange for referral to primary care provider and behavioral health provider that accept medicaid

## 2020-04-08 NOTE — Progress Notes (Signed)
[facility]  Chief Complaint  Patient presents with  . Follow-up    Not sleeping well. Chest fluttering    Constitutional:  BP 126/74 (BP Location: Left Arm, Cuff Size: Normal)   Pulse 82   Temp 99.1 F (37.3 C) (Oral)   Ht 5\' 8"  (1.727 m)   Wt 165 lb 3.2 oz (74.9 kg)   LMP 06/12/2014 (Exact Date)   SpO2 98%   BMI 25.12 kg/m   Past Medical History:  Fatty liver, Diverticulosis, Hyperthyroidism, Depression, Anxiety, OA  Past Surgical History:  Her  has a past surgical history that includes ovary tumor; Foot surgery; Diagnostic laparoscopy (2003); Wisdom tooth extraction; laparoscopy (N/A, 07/17/2012); Multiple extractions with alveoloplasty (N/A, 06/06/2014); Laparoscopic assisted vaginal hysterectomy (N/A, 08/12/2014); and Laparoscopic salpingo oophorectomy (Right, 08/12/2014).  Brief Summary:  Latasha Parker is a 44 y.o. female smoker with COPD/chronic bronchitis.  Hx of deafness.      Subjective:   She is here with sign language interpretor.  She is questioning why her dose of xanax was decreased.  She says she has tried calling primary care providers and behavioral health providers but no one is accepting medicaid patients.  She is not having cough, wheeze, or sputum.  Down to few cigarettes per day.  Physical Exam:   Deferred.  Pulmonary testing:   PFT 01/21/14 >> borderline obstruction, normal lung volumes/diffusion, no BD response  Social History:  She  reports that she has been smoking cigarettes. She has a 7.50 pack-year smoking history. She has never used smokeless tobacco. She reports that she does not drink alcohol and does not use drugs.  Family History:  Her family history includes Breast cancer in her maternal grandmother and paternal aunt; Cancer in her father, maternal grandmother, and mother.     Assessment/Plan:   COPD with chronic bronchitis. - continue flovent with prn albuterol  Tobacco  abuse. - continue bupropion  Anxiety with depression. - she previously reported to be seeing behavioral therapist in [facility], but uncertain what happened with this - explained to her that she needs to establish care with specialist to manage her anxiety and depression - will continue xanax 0.25 mg qhs prn for now, but explained our practice will not increase dose on this and will not be able to prescribe this in the future if she does not follow through with arranging for primary care and behavioral medicine appointments  Health care maintenance. - will send referral again to arrange for primary care provider that accepts medicaid  Time Spent Involved in Patient Care on Day of Examination:  21 minutes  Follow up:  Patient Instructions  Will arrange for referral to primary care provider and behavioral health provider that accept medicaid   Medication List:   Allergies as of 04/08/2020      Reactions   Tramadol Nausea And Vomiting   Vicodin [hydrocodone-acetaminophen] Nausea And Vomiting      Medication List       Accurate as of April 08, 2020  9:49 AM. If you have any questions, ask your nurse or doctor.        ALPRAZolam 0.25 MG tablet Commonly known as: Xanax Take 1 tablet (0.25 mg total) by mouth at bedtime as needed for anxiety.   buPROPion 150 MG 12 hr tablet Commonly known as: WELLBUTRIN SR Take 1 tablet (150 mg total) by mouth 2 (two) times daily.   Flovent HFA 44 MCG/ACT inhaler Generic drug: fluticasone INHALE  2 PUFFS BY MOUTH TWICE A DAY What changed:   how much to take  how to take this  when to take this   IRON PO Take 1 tablet by mouth daily.   multivitamin with minerals Tabs tablet Take 1 tablet by mouth daily.       Signature:  Chesley Mires, MD [place] Pager - [phone] 04/08/2020, 9:49 AM

## 2020-04-14 NOTE — Addendum Note (Signed)
Addended by: Lia Foyer R on: 04/14/2020 02:27 PM   Modules accepted: Orders

## 2020-05-06 ENCOUNTER — Telehealth: Payer: Self-pay | Admitting: Pulmonary Disease

## 2020-05-06 NOTE — Telephone Encounter (Signed)
Patient is requesting refill on xanax and welbutrin.  States she is establishing care with behavioral health in January, who will then be able to take over these refills.   Dr. Halford Chessman please advise if you are willing to refill these meds.  I have pended refills in this encounter to preferred pharmacy as previously prescribed. Thanks!

## 2020-05-07 MED ORDER — ALPRAZOLAM 0.25 MG PO TABS: 0.2500 mg | ORAL_TABLET | Freq: Every evening | ORAL | 0 refills | Status: DC | PRN

## 2020-05-07 MED ORDER — BUPROPION HCL ER (SR) 150 MG PO TB12
150.0000 mg | ORAL_TABLET | Freq: Two times a day (BID) | ORAL | 0 refills | Status: DC
Start: 2020-05-07 — End: 2021-05-27

## 2020-05-07 NOTE — Telephone Encounter (Signed)
Spoke with pt through interpreter, aware of refills.  States she establishes with Behavorial health on 06/02/20, aware that they will need to take over refills once she is established.  Nothing further needed at this time- will close encounter.

## 2020-05-07 NOTE — Telephone Encounter (Signed)
I have signed refills.  She will need to follow up with behavioral health for future refills.

## 2020-06-02 ENCOUNTER — Ambulatory Visit (HOSPITAL_COMMUNITY): Payer: Medicaid Other | Admitting: Psychiatry

## 2020-06-16 DIAGNOSIS — Z1152 Encounter for screening for COVID-19: Secondary | ICD-10-CM | POA: Diagnosis not present

## 2020-09-14 ENCOUNTER — Telehealth: Payer: Self-pay | Admitting: Pulmonary Disease

## 2020-09-14 DIAGNOSIS — Z0000 Encounter for general adult medical examination without abnormal findings: Secondary | ICD-10-CM

## 2020-09-14 NOTE — Telephone Encounter (Signed)
Okay to send referral to primary care practice that accepts medicaid patients.

## 2020-09-14 NOTE — Telephone Encounter (Signed)
Referral has been sent in for the pt.

## 2020-09-14 NOTE — Telephone Encounter (Signed)
VS please advise on a referral that we can place for PCP.  She stated that [facility] office is not accepting any new medicaid pts at this time.

## 2020-12-04 ENCOUNTER — Encounter: Payer: Medicaid Other | Admitting: Internal Medicine

## 2021-05-27 ENCOUNTER — Encounter: Payer: Self-pay | Admitting: Internal Medicine

## 2021-05-27 ENCOUNTER — Other Ambulatory Visit: Payer: Self-pay

## 2021-05-27 ENCOUNTER — Ambulatory Visit: Payer: Medicaid Other | Attending: Internal Medicine | Admitting: Internal Medicine

## 2021-05-27 VITALS — BP 112/70 | HR 71 | Resp 16 | Ht 68.0 in | Wt 187.6 lb

## 2021-05-27 DIAGNOSIS — F17200 Nicotine dependence, unspecified, uncomplicated: Secondary | ICD-10-CM

## 2021-05-27 DIAGNOSIS — M79671 Pain in right foot: Secondary | ICD-10-CM | POA: Diagnosis not present

## 2021-05-27 DIAGNOSIS — R42 Dizziness and giddiness: Secondary | ICD-10-CM | POA: Insufficient documentation

## 2021-05-27 DIAGNOSIS — N809 Endometriosis, unspecified: Secondary | ICD-10-CM | POA: Diagnosis not present

## 2021-05-27 DIAGNOSIS — M79672 Pain in left foot: Secondary | ICD-10-CM | POA: Diagnosis not present

## 2021-05-27 DIAGNOSIS — Z7689 Persons encountering health services in other specified circumstances: Secondary | ICD-10-CM | POA: Diagnosis not present

## 2021-05-27 DIAGNOSIS — H9203 Otalgia, bilateral: Secondary | ICD-10-CM | POA: Insufficient documentation

## 2021-05-27 DIAGNOSIS — J449 Chronic obstructive pulmonary disease, unspecified: Secondary | ICD-10-CM | POA: Insufficient documentation

## 2021-05-27 DIAGNOSIS — F411 Generalized anxiety disorder: Secondary | ICD-10-CM | POA: Diagnosis not present

## 2021-05-27 DIAGNOSIS — G629 Polyneuropathy, unspecified: Secondary | ICD-10-CM | POA: Diagnosis not present

## 2021-05-27 DIAGNOSIS — Z560 Unemployment, unspecified: Secondary | ICD-10-CM | POA: Insufficient documentation

## 2021-05-27 DIAGNOSIS — F17210 Nicotine dependence, cigarettes, uncomplicated: Secondary | ICD-10-CM | POA: Diagnosis not present

## 2021-05-27 DIAGNOSIS — H9311 Tinnitus, right ear: Secondary | ICD-10-CM | POA: Diagnosis not present

## 2021-05-27 DIAGNOSIS — G43009 Migraine without aura, not intractable, without status migrainosus: Secondary | ICD-10-CM | POA: Diagnosis not present

## 2021-05-27 DIAGNOSIS — H905 Unspecified sensorineural hearing loss: Secondary | ICD-10-CM | POA: Diagnosis not present

## 2021-05-27 DIAGNOSIS — Z23 Encounter for immunization: Secondary | ICD-10-CM | POA: Diagnosis not present

## 2021-05-27 MED ORDER — NICOTINE 21 MG/24HR TD PT24: 21.0000 mg | MEDICATED_PATCH | Freq: Every day | TRANSDERMAL | 0 refills | Status: DC

## 2021-05-27 MED ORDER — SUMATRIPTAN SUCCINATE 50 MG PO TABS: ORAL_TABLET | ORAL | 1 refills | Status: DC

## 2021-05-27 MED ORDER — PROMETHAZINE HCL 12.5 MG PO TABS: ORAL_TABLET | ORAL | 0 refills | Status: DC

## 2021-05-27 NOTE — Progress Notes (Signed)
Patient ID: Latasha Parker, female    DOB: 1975-10-26  MRN: [id number]  CC: New Patient (Initial Visit), Headache, Anxiety, Ear Pain (Ringing in right ), and Foot Pain (B/l)   Subjective: Latasha Parker is a 46 y.o. female who presents for new pt visit.  Cleda Clarks, from [employer] is with her to interpret.   Her concerns today include:  Hx of COPD, tob dep, anxiety, endometriosis, Congenital deafness  Not sure if she had a previous PCP; can not remember.  C/o having intermittent HA since July 2022.  Some HA in past but "awful since July." Usually RT side frontal, parietal and sometimes in back of HA. Can last several hrs to 2 days. Occurs about once a mth HA build in intensity -Assoc with photophobia, N/V.  Prefers to lay down.  Dizziness occasionally -She has not identified any triggers. Had hysterectomy several yrs ago. Has not identified any foods as triggers.  Sleeps only about 4 hrs at nights due to ringing and tingling in ears, mainly RT ear. Ringing in ear intermittent since July but happens every day.  "I go to sleep and its gone but then it will pop up again."  The ringing and tingling in the ear is her top concern.  No pain in the ears.  -takes Aleve one tab.  One will work every now and then; "if it is a bad one, the HA will continue and not go away." - Gets tingling in her legs and feet also since July.  No numbnesss in legs. Last several mins and sometimes longer.  No back pain.  Does not drink ETOH bev at all  Anxiety:  "always, up and down my whole live."  Never saw [facility] specialist.  Was on Xanax in past by Dr. Halford Chessman her lung specialist.  Off med since July 2022.  Currently not on any med  Tob dep: smokes 6-7 cig/day and does not smoke in house.  Smoked for 20 yrs. Tried to quit and wants to. Had quit for 1 yr x 2 when pregnant.    Past medical, family, social history reviewed and updated..  Patient Active Problem List   Diagnosis Date Noted   Anxiety 08/14/2018    Post-operative state 08/12/2014   Dermoid cyst 06/19/2014   Health care maintenance 10/09/2013   Right knee pain 10/09/2013   Insomnia 09/09/2013   COPD (chronic obstructive pulmonary disease) with chronic bronchitis ([place]) 09/09/2013   Tobacco abuse 09/09/2013   Endometriosis 11/27/2012   Menorrhagia 01/13/2012   Dysmenorrhea 01/13/2012   Back pain 01/13/2012   Ovarian cyst 01/13/2012     Current Outpatient Medications on File Prior to Visit  Medication Sig Dispense Refill   FLOVENT HFA 44 MCG/ACT inhaler INHALE 2 PUFFS BY MOUTH TWICE A DAY 31.8 Inhaler 3   No current facility-administered medications on file prior to visit.    Allergies  Allergen Reactions   Tramadol Nausea And Vomiting   Vicodin [Hydrocodone-Acetaminophen] Nausea And Vomiting    Social History   Socioeconomic History   Marital status: Significant Other    Spouse name: Not on file   Number of children: 2   Years of education: Not on file   Highest education level: Not on file  Occupational History   Occupation: unemployed  Tobacco Use   Smoking status: Every Day    Packs/day: 0.50    Years: 15.00    Pack years: 7.50    Types: Cigarettes   Smokeless tobacco: Never  Tobacco comments:    04/10/2019 reports down to 1/2 pack per day  Vaping Use   Vaping Use: Never used  Substance and Sexual Activity   Alcohol use: No   Drug use: No   Sexual activity: Not Currently    Birth control/protection: None  Other Topics Concern   Not on file  Social History Narrative   Not on file   Social Determinants of Health   Financial Resource Strain: Not on file  Food Insecurity: Not on file  Transportation Needs: Not on file  Physical Activity: Not on file  Stress: Not on file  Social Connections: Not on file  Intimate Partner Violence: Not on file    Family History  Problem Relation Age of Onset   Cancer Mother    Cancer Maternal Grandmother    Breast cancer Maternal Grandmother    Cancer Father     Breast cancer Paternal Aunt     Past Surgical History:  Procedure Laterality Date   DIAGNOSTIC LAPAROSCOPY  2003   ovarian cyst removed   FOOT SURGERY     LAPAROSCOPIC ASSISTED VAGINAL HYSTERECTOMY N/A 08/12/2014   Procedure: LAPAROSCOPIC ASSISTED VAGINAL HYSTERECTOMY;  Surgeon: Emily Filbert, MD;  Location: [facility];  Service: Gynecology;  Laterality: N/A;   LAPAROSCOPIC SALPINGO OOPHERECTOMY Right 08/12/2014   Procedure: REMOVE RIGHT SALPINGO OOPHORECTOMY;  Surgeon: Emily Filbert, MD;  Location: [facility];  Service: Gynecology;  Laterality: Right;   LAPAROSCOPY N/A 07/17/2012   Procedure: LAPAROSCOPY OPERATIVE;  Surgeon: Mora Bellman, MD;  Location: [facility];  Service: Gynecology;  Laterality: N/A;  left oopherectomy and salpingectomy   MULTIPLE EXTRACTIONS WITH ALVEOLOPLASTY N/A 06/06/2014   Procedure: MULTIPLE EXTRACTIONS WITH ALVEOLOPLASTY/REMOVAL OF TORI;  Surgeon: Gae Bon, DDS;  Location: [facility];  Service: Oral Surgery;  Laterality: N/A;   ovary tumor     cyst   WISDOM TOOTH EXTRACTION      ROS: Review of Systems Negative except as stated above  PHYSICAL EXAM: BP 112/70    Pulse 71    Resp 16    Ht 5\' 8"  (1.727 m)    Wt 187 lb 9.6 oz (85.1 kg)    LMP 06/12/2014 (Exact Date)    SpO2 94%    BMI 28.52 kg/m   Physical Exam  General appearance - alert, well appearing, middle-aged Caucasian female and in no distress Mental status - normal mood, behavior,  dress, motor activity, and thought processes Eyes - pupils equal and reactive, extraocular eye movements intact Ears - bilateral TM's and external ear canals normal Nose - normal and patent, no erythema, discharge or polyps Mouth - mucous membranes moist, pharynx normal without lesions Neck - supple, no significant adenopathy.  No carotid bruits heard. Chest - clear to auscultation, no wheezes, rales or rhonchi, symmetric air entry Heart - normal rate, regular rhythm, normal S1, S2, no murmurs, rubs, clicks or  gallops Neurological - cranial nerves intact except for deafness, motor and sensory grossly normal bilaterally, normal muscle tone, no tremors.  Brisk knee and ankle jerk reflexes bilaterally.  Gait is steady.  Leap exam normal bilaterally  GAD 7 : Generalized Anxiety Score 05/27/2021 08/24/2017  Nervous, Anxious, on Edge 2 1  Control/stop worrying 0 2  Worry too much - different things 1 3  Trouble relaxing 2 0  Restless 2 1  Easily annoyed or irritable 1 2  Afraid - awful might happen 1 1  Total GAD 7 Score 9 10    Depression  screen [facility] 2/9 05/27/2021 08/24/2017  Decreased Interest 2 0  Down, Depressed, Hopeless 2 1  PHQ - 2 Score 4 1  Altered sleeping 3 0  Tired, decreased energy 3 3  Change in appetite 0 0  Feeling bad or failure about yourself  1 1  Trouble concentrating 0 0  Moving slowly or fidgety/restless 1 0  Suicidal thoughts 0 0  PHQ-9 Score 12 5    CMP Latest Ref Rng & Units 11/19/2017 11/06/2014 12/21/2011  Glucose 70 - 99 mg/dL 85 102(H) 89  BUN 6 - 20 mg/dL 7 <5(L) 7  Creatinine 0.44 - 1.00 mg/dL 0.70 0.62 0.55  Sodium 135 - 145 mmol/L 139 141 136  Potassium 3.5 - 5.1 mmol/L 3.6 3.9 3.7  Chloride 98 - 111 mmol/L 106 109 104  CO2 22 - 32 mmol/L 25 25 22   Calcium 8.9 - 10.3 mg/dL 8.9 9.6 8.6  Total Protein 6.5 - 8.1 g/dL 6.4(L) 6.9 6.8  Total Bilirubin 0.3 - 1.2 mg/dL 0.3 0.6 0.3  Alkaline Phos 38 - 126 U/L 87 95 74  AST 15 - 41 U/L 23 26 18   ALT 0 - 44 U/L 19 17 13    Lipid Panel  No results found for: CHOL, TRIG, HDL, CHOLHDL, VLDL, LDLCALC, LDLDIRECT  CBC    Component Value Date/Time   WBC 9.2 11/19/2017 1123   RBC 4.27 11/19/2017 1123   HGB 13.1 11/19/2017 1123   HGB 13.8 09/20/2017 1630   HCT 40.7 11/19/2017 1123   HCT 41.0 09/20/2017 1630   PLT 451 (H) 11/19/2017 1123   PLT 480 (H) 09/20/2017 1630   MCV 95.3 11/19/2017 1123   MCV 92 09/20/2017 1630   MCH 30.7 11/19/2017 1123   MCHC 32.2 11/19/2017 1123   RDW 14.3 11/19/2017 1123   RDW 15.0  09/20/2017 1630   LYMPHSABS 2.4 11/06/2014 0950   MONOABS 0.6 11/06/2014 0950   EOSABS 0.3 11/06/2014 0950   BASOSABS 0.1 11/06/2014 0950    ASSESSMENT AND PLAN: 1. Establishing care with new doctor, encounter for   2. Migraine without aura and without status migrainosus, not intractable Discussed diagnosis of migraine with her.  Since they are occurring on average about once a month, I will not put her on prophylaxis.  We will use Imitrex for abortive therapy.  I went over with her how to use the medication.  I have also given some Phenergan to use as needed.  Discussed importance of getting adequate sleep, regular exercise and healthy eating habits. - Ambulatory referral to Neurology - promethazine (PHENERGAN) 12.5 MG tablet; 1 tab PO daily PRN for nausea/vomiting  Dispense: 15 tablet; Refill: 0 - SUMAtriptan (IMITREX) 50 MG tablet; 1 tab PO at start of headache, may repeat in 2 hrs if no relief.  Max 2tabs/24 hrs  Dispense: 10 tablet; Refill: 1  3. Tinnitus of right ear Advised that there is sometimes difficulty in determining the cause of tinnitus.  However I will refer her to ENT and neurology especially since the tinnitus gets worse when she has a migraine headache. - Ambulatory referral to ENT - Ambulatory referral to Neurology  4. Congenital deafness - Ambulatory referral to ENT  5. Peripheral polyneuropathy Patient with symptoms of neuropathy in the lower extremities below the knee.  We will check some baseline screening tests. - CBC - Comprehensive metabolic panel - TSH - Vitamin B12 - Hemoglobin A1c  6. Tobacco dependence Pt is current smoker. Patient advised to quit smoking. Discussed health risks  associated with smoking including lung and other types of cancers, chronic lung diseases and CV risks.. Pt ready/not ready to give trail of quitting.   Discussed methods to help quit including quitting cold [place], use of NRT, Chantix and Bupropion.  Pt wanting to try:  Nicotine patches.  Went over the stepdown process with her.  Start with 21 mg patches. 3__ Minutes spent on counseling. F/U: Reassess progress on subsequent visit.  - nicotine (NICODERM CQ - DOSED IN MG/24 HOURS) 21 mg/24hr patch; Place 1 patch (21 mg total) onto the skin daily.  Dispense: 28 patch; Refill: 0  7. Generalized anxiety disorder We did not get to discuss this in detail today.  Advised patient that we will pick this up on her subsequent visit.  8. Need for immunization against influenza - Flu Vaccine QUAD 40mo+IM (Fluarix, Fluzone & Alfiuria Quad PF)  9. Need for Streptococcus pneumoniae vaccination - Pneumococcal conjugate vaccine 20-valent    Patient was given the opportunity to ask questions.  Patient verbalized understanding of the plan and was able to repeat key elements of the plan.   Orders Placed This Encounter  Procedures   Pneumococcal conjugate vaccine 20-valent   Flu Vaccine QUAD 56mo+IM (Fluarix, Fluzone & Alfiuria Quad PF)   CBC   Comprehensive metabolic panel   TSH   Vitamin B12   Hemoglobin A1c   Ambulatory referral to ENT   Ambulatory referral to Neurology     Requested Prescriptions   Signed Prescriptions Disp Refills   promethazine (PHENERGAN) 12.5 MG tablet 15 tablet 0    Sig: 1 tab PO daily PRN for nausea/vomiting   SUMAtriptan (IMITREX) 50 MG tablet 10 tablet 1    Sig: 1 tab PO at start of headache, may repeat in 2 hrs if no relief.  Max 2tabs/24 hrs   nicotine (NICODERM CQ - DOSED IN MG/24 HOURS) 21 mg/24hr patch 28 patch 0    Sig: Place 1 patch (21 mg total) onto the skin daily.    Return in about 5 weeks (around 07/01/2021).  Karle Plumber, MD, FACP

## 2021-05-27 NOTE — Patient Instructions (Signed)
Migraine Headache A migraine headache is an intense, throbbing pain on one side or both sides of the head. Migraine headaches may also cause other symptoms, such as nausea, vomiting, and sensitivity to light and noise. A migraine headache can last from 4 hours to 3 days. Talk with your doctor about what things may bring on (trigger) your migraine headaches. What are the causes? The exact cause of this condition is not known. However, a migraine may be caused when nerves in the brain become irritated and release chemicals that cause inflammation of blood vessels. This inflammation causes pain. This condition may be triggered or caused by: Drinking alcohol. Smoking. Taking medicines, such as: Medicine used to treat chest pain (nitroglycerin). Birth control pills. Estrogen. Certain blood pressure medicines. Eating or drinking products that contain nitrates, glutamate, aspartame, or tyramine. Aged cheeses, chocolate, or caffeine may also be triggers. Doing physical activity. Other things that may trigger a migraine headache include: Menstruation. Pregnancy. Hunger. Stress. Lack of sleep or too much sleep. Weather changes. Fatigue. What increases the risk? The following factors may make you more likely to experience migraine headaches: Being a certain age. This condition is more common in people who are 25-55 years old. Being female. Having a family history of migraine headaches. Being Caucasian. Having a mental health condition, such as depression or anxiety. Being obese. What are the signs or symptoms? The main symptom of this condition is pulsating or throbbing pain. This pain may: Happen in any area of the head, such as on one side or both sides. Interfere with daily activities. Get worse with physical activity. Get worse with exposure to bright lights or loud noises. Other symptoms may include: Nausea. Vomiting. Dizziness. General sensitivity to bright lights, loud noises, or  smells. Before you get a migraine headache, you may get warning signs (an aura). An aura may include: Seeing flashing lights or having blind spots. Seeing bright spots, halos, or zigzag lines. Having tunnel vision or blurred vision. Having numbness or a tingling feeling. Having trouble talking. Having muscle weakness. Some people have symptoms after a migraine headache (postdromal phase), such as: Feeling tired. Difficulty concentrating. How is this diagnosed? A migraine headache can be diagnosed based on: Your symptoms. A physical exam. Tests, such as: CT scan or an MRI of the head. These imaging tests can help rule out other causes of headaches. Taking fluid from the spine (lumbar puncture) and analyzing it (cerebrospinal fluid analysis, or CSF analysis). How is this treated? This condition may be treated with medicines that: Relieve pain. Relieve nausea. Prevent migraine headaches. Treatment for this condition may also include: Acupuncture. Lifestyle changes like avoiding foods that trigger migraine headaches. Biofeedback. Cognitive behavioral therapy. Follow these instructions at home: Medicines Take over-the-counter and prescription medicines only as told by your health care provider. Ask your health care provider if the medicine prescribed to you: Requires you to avoid driving or using heavy machinery. Can cause constipation. You may need to take these actions to prevent or treat constipation: Drink enough fluid to keep your urine pale yellow. Take over-the-counter or prescription medicines. Eat foods that are high in fiber, such as beans, whole grains, and fresh fruits and vegetables. Limit foods that are high in fat and processed sugars, such as fried or sweet foods. Lifestyle Do not drink alcohol. Do not use any products that contain nicotine or tobacco, such as cigarettes, e-cigarettes, and chewing tobacco. If you need help quitting, ask your health care  provider. Get at least 8   hours of sleep every night. Find ways to manage stress, such as meditation, deep breathing, or yoga. General instructions   Keep a journal to find out what may trigger your migraine headaches. For example, write down: What you eat and drink. How much sleep you get. Any change to your diet or medicines. If you have a migraine headache: Avoid things that make your symptoms worse, such as bright lights. It may help to lie down in a dark, quiet room. Do not drive or use heavy machinery. Ask your health care provider what activities are safe for you while you are experiencing symptoms. Keep all follow-up visits as told by your health care provider. This is important. Contact a health care provider if: You develop symptoms that are different or more severe than your usual migraine headache symptoms. You have more than 15 headache days in one month. Get help right away if: Your migraine headache becomes severe. Your migraine headache lasts longer than 72 hours. You have a fever. You have a stiff neck. You have vision loss. Your muscles feel weak or like you cannot control them. You start to lose your balance often. You have trouble walking. You faint. You have a seizure. Summary A migraine headache is an intense, throbbing pain on one side or both sides of the head. Migraines may also cause other symptoms, such as nausea, vomiting, and sensitivity to light and noise. This condition may be treated with medicines and lifestyle changes. You may also need to avoid certain things that trigger a migraine headache. Keep a journal to find out what may trigger your migraine headaches. Contact your health care provider if you have more than 15 headache days in a month or you develop symptoms that are different or more severe than your usual migraine headache symptoms. This information is not intended to replace advice given to you by your health care provider. Make sure you  discuss any questions you have with your health care provider. Document Revised: 08/31/2018 Document Reviewed: 06/21/2018 Elsevier Patient Education  2022 [employer]  

## 2021-05-28 LAB — CBC
Hematocrit: 43 % (ref 34.0–46.6)
Hemoglobin: 14.4 g/dL (ref 11.1–15.9)
MCH: 31.3 pg (ref 26.6–33.0)
MCHC: 33.5 g/dL (ref 31.5–35.7)
MCV: 94 fL (ref 79–97)
Platelets: 528 10*3/uL — ABNORMAL HIGH (ref 150–450)
RBC: 4.6 x10E6/uL (ref 3.77–5.28)
RDW: 13.1 % (ref 11.7–15.4)
WBC: 9.5 10*3/uL (ref 3.4–10.8)

## 2021-05-28 LAB — TSH: TSH: 0.939 u[IU]/mL (ref 0.450–4.500)

## 2021-05-28 LAB — COMPREHENSIVE METABOLIC PANEL
ALT: 22 IU/L (ref 0–32)
AST: 28 IU/L (ref 0–40)
Albumin/Globulin Ratio: 2.3 — ABNORMAL HIGH (ref 1.2–2.2)
Albumin: 4.6 g/dL (ref 3.8–4.8)
Alkaline Phosphatase: 138 IU/L — ABNORMAL HIGH (ref 44–121)
BUN/Creatinine Ratio: 11 (ref 9–23)
BUN: 8 mg/dL (ref 6–24)
Bilirubin Total: <0.2 mg/dL (ref 0.0–1.2)
CO2: 23 mmol/L (ref 20–29)
Calcium: 9.5 mg/dL (ref 8.7–10.2)
Chloride: 103 mmol/L (ref 96–106)
Creatinine, Ser: 0.71 mg/dL (ref 0.57–1.00)
Globulin, Total: 2 g/dL (ref 1.5–4.5)
Glucose: 82 mg/dL (ref 70–99)
Potassium: 4.5 mmol/L (ref 3.5–5.2)
Sodium: 141 mmol/L (ref 134–144)
Total Protein: 6.6 g/dL (ref 6.0–8.5)
eGFR: 108 mL/min/{1.73_m2} (ref >59)

## 2021-05-28 LAB — HEMOGLOBIN A1C
Est. average glucose Bld gHb Est-mCnc: 108 mg/dL
Hgb A1c MFr Bld: 5.4 % (ref 4.8–5.6)

## 2021-05-28 LAB — VITAMIN B12: Vitamin B-12: 998 pg/mL (ref 232–1245)

## 2021-05-29 ENCOUNTER — Telehealth: Payer: Self-pay | Admitting: Internal Medicine

## 2021-05-29 ENCOUNTER — Encounter: Payer: Self-pay | Admitting: Internal Medicine

## 2021-05-29 NOTE — Progress Notes (Signed)
Lab letter completed.  I will have my CMA mail to pt.

## 2021-06-01 NOTE — Telephone Encounter (Signed)
Letter has been mailed.

## 2021-06-10 ENCOUNTER — Ambulatory Visit: Payer: Self-pay | Admitting: *Deleted

## 2021-06-10 NOTE — Telephone Encounter (Signed)
Patient called to review lab results sent to her regarding platelets and liver results. Reviewed lab results with patient via sign language interpreter. Reviewed future appt . Patient verbalized understanding and will call back for further questions.

## 2021-07-06 ENCOUNTER — Encounter: Payer: Self-pay | Admitting: Psychiatry

## 2021-07-06 ENCOUNTER — Ambulatory Visit: Payer: Medicaid Other | Admitting: Psychiatry

## 2021-07-06 VITALS — BP 103/70 | HR 85 | Ht 68.0 in | Wt 187.0 lb

## 2021-07-06 DIAGNOSIS — H9312 Tinnitus, left ear: Secondary | ICD-10-CM | POA: Diagnosis not present

## 2021-07-06 DIAGNOSIS — G43009 Migraine without aura, not intractable, without status migrainosus: Secondary | ICD-10-CM | POA: Diagnosis not present

## 2021-07-06 DIAGNOSIS — R519 Headache, unspecified: Secondary | ICD-10-CM

## 2021-07-06 NOTE — Progress Notes (Signed)
Referring:  Ladell Pier, MD [address] [place],  [place] [address]  PCP: Ladell Pier, MD  Neurology was asked to evaluate Starlena Beil, a 46 year old female for a chief complaint of headaches.  Our recommendations of care will be communicated by shared medical record.    CC:  headaches  HPI:  Medical co-morbidities: COPD, anxiety, congenital deafness   The patient presents for evaluation of headaches and tinnitus in the left ear which began 6 months ago. Headaches are intermittent but the tinnitus has become constant. She is currently getting headaches 1-2 times per month which are associated with photophobia, nausea, and vomiting. They last 1-2 days without treatment. States she has had bad headaches in the past, but the unilateral tinnitus is new. Sometimes feels like her ear is clogged or sinuses are draining on that side. She is congenitally deaf, so she cannot assess for ipsilateral hearing loss.  For her headaches she was prescribed Imitrex 50 mg PRN. She has taken this 3 times and it has helped. Feels headaches are well-controlled with this medication.  Headache History: Onset: 6 months ago Triggers: no Aura: no Location: left temple, occiput, neck Quality/Description: squeezing, tension Associated Symptoms:  Photophobia: yes  Phonophobia: no  Nausea: yes Vomiting: yes Worse with activity?: yes Duration of headaches: 1-2 days  Headache days per month: 2 Headache free days per month: 28  Current Treatment: Abortive Imitrex 50 mg PRN Aleve  Preventative none  Prior Therapies                                 Imitrex PRN Aleve  Headache Risk Factors: Headache risk factors and/or co-morbidities (+) Neck Pain (-) History of Motor Vehicle Accident (-) History of Traumatic Brain Injury and/or Concussion  LABS: CBC    Component Value Date/Time   WBC 9.5 05/27/2021 1516   WBC 9.2 11/19/2017 1123   RBC 4.60 05/27/2021 1516   RBC 4.27  11/19/2017 1123   HGB 14.4 05/27/2021 1516   HCT 43.0 05/27/2021 1516   PLT 528 (H) 05/27/2021 1516   MCV 94 05/27/2021 1516   MCH 31.3 05/27/2021 1516   MCH 30.7 11/19/2017 1123   MCHC 33.5 05/27/2021 1516   MCHC 32.2 11/19/2017 1123   RDW 13.1 05/27/2021 1516   LYMPHSABS 2.4 11/06/2014 0950   MONOABS 0.6 11/06/2014 0950   EOSABS 0.3 11/06/2014 0950   BASOSABS 0.1 11/06/2014 0950   CMP Latest Ref Rng & Units 05/27/2021 11/19/2017 11/06/2014  Glucose 70 - 99 mg/dL 82 85 102(H)  BUN 6 - 24 mg/dL 8 7 <5(L)  Creatinine 0.57 - 1.00 mg/dL 0.71 0.70 0.62  Sodium 134 - 144 mmol/L 141 139 141  Potassium 3.5 - 5.2 mmol/L 4.5 3.6 3.9  Chloride 96 - 106 mmol/L 103 106 109  CO2 20 - 29 mmol/L 23 25 25   Calcium 8.7 - 10.2 mg/dL 9.5 8.9 9.6  Total Protein 6.0 - 8.5 g/dL 6.6 6.4(L) 6.9  Total Bilirubin 0.0 - 1.2 mg/dL <0.2 0.3 0.6  Alkaline Phos 44 - 121 IU/L 138(H) 87 95  AST 0 - 40 IU/L 28 23 26   ALT 0 - 32 IU/L 22 19 17      IMAGING:  none   Current Outpatient Medications on File Prior to Visit  Medication Sig Dispense Refill   FLOVENT HFA 44 MCG/ACT inhaler INHALE 2 PUFFS BY MOUTH TWICE A DAY 31.8 Inhaler  3   nicotine (NICODERM CQ - DOSED IN MG/24 HOURS) 21 mg/24hr patch Place 1 patch (21 mg total) onto the skin daily. 28 patch 0   promethazine (PHENERGAN) 12.5 MG tablet 1 tab PO daily PRN for nausea/vomiting 15 tablet 0   SUMAtriptan (IMITREX) 50 MG tablet 1 tab PO at start of headache, may repeat in 2 hrs if no relief.  Max 2tabs/24 hrs 10 tablet 1   No current facility-administered medications on file prior to visit.     Allergies: Allergies  Allergen Reactions   Tramadol Nausea And Vomiting   Vicodin [Hydrocodone-Acetaminophen] Nausea And Vomiting    Family History: Migraine or other headaches in the family:  mother Aneurysms in a first degree relative:  no Brain tumors in the family:  grandmother Other neurological illness in the family:   no  Past Medical  History: Past Medical History:  Diagnosis Date   Anxiety    Arthritis    Blood transfusion without reported diagnosis 2015   COPD (chronic obstructive pulmonary disease) ([facility]) 2015   Cyst of ovary    right   Deaf    Depression    Female bladder prolapse 2019   Hyperthyroidism    Menorrhagia    Neuromuscular disorder ([facility]) 2009   Pelvic pain    SVD (spontaneous vaginal delivery)     Past Surgical History Past Surgical History:  Procedure Laterality Date   DIAGNOSTIC LAPAROSCOPY  2003   ovarian cyst removed   FOOT SURGERY     LAPAROSCOPIC ASSISTED VAGINAL HYSTERECTOMY N/A 08/12/2014   Procedure: LAPAROSCOPIC ASSISTED VAGINAL HYSTERECTOMY;  Surgeon: Emily Filbert, MD;  Location: [facility];  Service: Gynecology;  Laterality: N/A;   LAPAROSCOPIC SALPINGO OOPHERECTOMY Right 08/12/2014   Procedure: REMOVE RIGHT SALPINGO OOPHORECTOMY;  Surgeon: Emily Filbert, MD;  Location: [facility];  Service: Gynecology;  Laterality: Right;   LAPAROSCOPY N/A 07/17/2012   Procedure: LAPAROSCOPY OPERATIVE;  Surgeon: Mora Bellman, MD;  Location: [facility];  Service: Gynecology;  Laterality: N/A;  left oopherectomy and salpingectomy   MULTIPLE EXTRACTIONS WITH ALVEOLOPLASTY N/A 06/06/2014   Procedure: MULTIPLE EXTRACTIONS WITH ALVEOLOPLASTY/REMOVAL OF TORI;  Surgeon: Gae Bon, DDS;  Location: [facility];  Service: Oral Surgery;  Laterality: N/A;   ovary tumor     cyst   WISDOM TOOTH EXTRACTION      Social History: Social History   Tobacco Use   Smoking status: Every Day    Packs/day: 0.50    Years: 15.00    Pack years: 7.50    Types: Cigarettes   Smokeless tobacco: Never   Tobacco comments:    04/10/2019 reports down to 1/2 pack per day  Vaping Use   Vaping Use: Never used  Substance Use Topics   Alcohol use: No   Drug use: No     ROS: Negative for fevers, chills. Positive for headaches, left-sided tinnitus. All other systems reviewed and negative unless stated otherwise in HPI.   Physical Exam:    Vital Signs: BP 103/70    Pulse 85    Ht 5\' 8"  (1.727 m)    Wt 187 lb (84.8 kg)    LMP 06/12/2014 (Exact Date)    BMI 28.43 kg/m  GENERAL: well appearing,in no acute distress,alert SKIN:  Color, texture, turgor normal. No rashes or lesions HEAD:  Normocephalic/atraumatic. CV:  RRR RESP: Normal respiratory effort MSK: +tenderness to palpation over left occiput, neck, and shoulder  NEUROLOGICAL: Mental Status: Alert, oriented to person, place and time,Follows commands  Cranial Nerves: PERRL,visual fields intact to confrontation,extraocular movements intact,facial sensation intact,no facial droop or ptosis, deaf at baseline and communicates through sign language Motor: muscle strength 5/5 both upper and lower extremities,no drift, normal tone Reflexes: 2+ throughout Sensation: intact to light touch all 4 extremities Coordination: Finger-to- nose-finger intact bilaterally Gait: normal-based   IMPRESSION: 46 year old female with a history of COPD, anxiety, congenital deafness who presents for evaluation of headaches and new constant tinnitus in the left ear. Will order MRI brain given family history of brain tumor and new unilateral tinnitus. She feels headaches are well-controlled with PRN Imitrex. Could consider neck PT in the future if headaches worsen as she does have significant tenderness of the left neck and shoulder.  PLAN: -MRI brain with contrast -Rescue: Continue Imitrex 50 mg PRN -next steps: consider neck PT, consider ENT referral if MRI normal   I spent a total of 25 minutes chart reviewing and counseling the patient. Headache education was done. Discussed treatment options including preventive and acute medications, natural supplements, and physical therapy. Discussed medication overuse headache and to limit use of acute treatments to no more than 2 days/week or 10 days/month. Discussed medication side effects, adverse reactions and drug interactions. Written educational  materials and patient instructions outlining all of the above were given.  Follow-up: after testing   Genia Harold, MD 07/06/2021   12:06 PM

## 2021-07-08 ENCOUNTER — Telehealth: Payer: Self-pay | Admitting: Psychiatry

## 2021-07-08 NOTE — Telephone Encounter (Signed)
[facility] community auth: [id number] (exp. 07/08/21 to 08/22/21) order sent to GI, they will reach out to the patient to schedule.

## 2021-07-16 ENCOUNTER — Other Ambulatory Visit: Payer: Self-pay | Admitting: Obstetrics and Gynecology

## 2021-07-19 ENCOUNTER — Other Ambulatory Visit: Payer: Self-pay

## 2021-07-19 ENCOUNTER — Ambulatory Visit
Admission: RE | Admit: 2021-07-19 | Discharge: 2021-07-19 | Disposition: A | Discharge status: Home / Self Care | Payer: Medicaid Other | Source: Ambulatory Visit | Attending: Psychiatry | Admitting: Psychiatry

## 2021-07-19 DIAGNOSIS — R519 Headache, unspecified: Secondary | ICD-10-CM

## 2021-07-19 MED ORDER — GADOBENATE DIMEGLUMINE 529 MG/ML IV SOLN
15.0000 mL | Freq: Once | INTRAVENOUS | Status: AC | PRN
  Administered 2021-07-19: 15 mL via INTRAVENOUS

## 2021-07-20 ENCOUNTER — Telehealth: Payer: Self-pay | Admitting: Internal Medicine

## 2021-07-20 ENCOUNTER — Telehealth: Payer: Self-pay | Admitting: *Deleted

## 2021-07-20 NOTE — Telephone Encounter (Signed)
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 This can be a normal finding, but can sometimes cause symptoms. Sometimes people with this will have specific headaches which are in the back of the head, occur with coughing or sneezing, and only last for a few minutes at a time. She should let me know if she experiences any of these headaches. Patient reported these headaches,  1-2 x months since Aug, lasting 2 days. She has explained this to MD and has filled prescriptions for headaches. She reported nausea and her PCP prescribes promethazine. Reviewed Dr Georgina Peer plan, patient stated she's doing neck exercises on her own. Answered her questions to her stated satisfaction. She asked for FU; scheduled her for 2 month FU. Patient verbalized understanding, appreciation.

## 2021-07-20 NOTE — Telephone Encounter (Signed)
Returned pt call. Made pt aware that she will need to contact her neurologist to go over MRI results pt states she understands and doesn't have any questions or concerns

## 2021-07-20 NOTE — Telephone Encounter (Signed)
Copied from [facility] [id number]. Topic: General - Other >> Jul 20, 2021  3:29 PM Valere Dross wrote: Reason for CRM: Pt called in wanting to get MRI results, please advise. (Sign Language)

## 2021-07-27 ENCOUNTER — Other Ambulatory Visit: Payer: Self-pay

## 2021-07-27 ENCOUNTER — Ambulatory Visit: Payer: Medicaid Other | Attending: Internal Medicine | Admitting: Internal Medicine

## 2021-07-27 ENCOUNTER — Encounter: Payer: Self-pay | Admitting: Internal Medicine

## 2021-07-27 VITALS — BP 109/69 | HR 71 | Ht 68.0 in | Wt 189.2 lb

## 2021-07-27 DIAGNOSIS — G629 Polyneuropathy, unspecified: Secondary | ICD-10-CM

## 2021-07-27 DIAGNOSIS — G43009 Migraine without aura, not intractable, without status migrainosus: Secondary | ICD-10-CM | POA: Diagnosis not present

## 2021-07-27 DIAGNOSIS — F17200 Nicotine dependence, unspecified, uncomplicated: Secondary | ICD-10-CM

## 2021-07-27 DIAGNOSIS — D75839 Thrombocytosis, unspecified: Secondary | ICD-10-CM | POA: Diagnosis not present

## 2021-07-27 MED ORDER — TOPIRAMATE 25 MG PO TABS: 25.0000 mg | ORAL_TABLET | Freq: Every day | ORAL | 3 refills | Status: DC

## 2021-07-27 NOTE — Patient Instructions (Addendum)
Take the Imitrex whenever you feel the headache coming on. ? ?I have referred you for a nerve conduction study on your legs.  I will send you your results via mail once I receive it. ? ?I have referred you to a blood specialist called a hematologist. ?

## 2021-07-27 NOTE — Progress Notes (Signed)
Patient ID: Latasha Parker, female    DOB: 1975-09-18  MRN: [id number]  CC: Follow-up (5 follow up, pt  said that headaches have got better not much , has an appt with neuologist next month and follow up with ent also, yesterday she had an headaches but today better.medication is making her sleep.)   Subjective: Latasha Parker is a 46 y.o. female who presents for f/u Her concerns today include:  Hx of COPD, tob dep, anxiety, endometriosis, Congenital deafness   Migraines: Since last visit, she did get into see the neurologist Dr. Billey Gosling. Had MRI that showed borderline Chiari malformation yesterday morning she woke up with bad HA. Took Aleve which help.  Prescribed Imitrex on last visit Imitrex works well.  very helpful but causes some drowsiness.    Tinnitis: In addition to headache, on last visit she complained of tinnitus in the right ear.  She reports that this is a little less since last visit.  She was referred to ENT. appt scheduled for May 1st with ENT.    Tob dep: On last visit, she expressed desire to quit smoking.  Nicotine patches were prescribed.  She is using the patches and finds it helpful.  She has decreased smoking to 1 to 2 cigarettes a day.  She is still working on quitting.    On last visit, she complains of tingling in her legs and feet intermittently since July of last year.  We screened for diabetes and vitamin B12 deficiency on labs, these came back okay.  Today she reports that she continues to get the uncomfortable feeling in her legs like pins-and-needles   I went over results of blood tests.  Endorses receiving lab letter.  She has some mild chronic thrombocytosis that has worsened.  Patient Active Problem List   Diagnosis Date Noted   Migraine without aura and without status migrainosus, not intractable 05/27/2021   Tinnitus of right ear 05/27/2021   Congenital deafness 05/27/2021   Peripheral polyneuropathy 05/27/2021   Tobacco dependence 05/27/2021    Generalized anxiety disorder 05/27/2021   Anxiety 08/14/2018   Post-operative state 08/12/2014   Dermoid cyst 06/19/2014   Health care maintenance 10/09/2013   Right knee pain 10/09/2013   Insomnia 09/09/2013   COPD (chronic obstructive pulmonary disease) with chronic bronchitis ([facility]) 09/09/2013   Tobacco abuse 09/09/2013   Endometriosis 11/27/2012   Menorrhagia 01/13/2012   Dysmenorrhea 01/13/2012   Back pain 01/13/2012   Ovarian cyst 01/13/2012     Current Outpatient Medications on File Prior to Visit  Medication Sig Dispense Refill   FLOVENT HFA 44 MCG/ACT inhaler INHALE 2 PUFFS BY MOUTH TWICE A DAY 31.8 Inhaler 3   nicotine (NICODERM CQ - DOSED IN MG/24 HOURS) 21 mg/24hr patch Place 1 patch (21 mg total) onto the skin daily. 28 patch 0   promethazine (PHENERGAN) 12.5 MG tablet 1 tab PO daily PRN for nausea/vomiting 15 tablet 0   SUMAtriptan (IMITREX) 50 MG tablet 1 tab PO at start of headache, may repeat in 2 hrs if no relief.  Max 2tabs/24 hrs 10 tablet 1   No current facility-administered medications on file prior to visit.    Allergies  Allergen Reactions   Tramadol Nausea And Vomiting   Vicodin [Hydrocodone-Acetaminophen] Nausea And Vomiting    Social History   Socioeconomic History   Marital status: Significant Other    Spouse name: Not on file   Number of children: 2   Years of education: Not on file  Highest education level: High school graduate  Occupational History   Occupation: unemployed  Tobacco Use   Smoking status: Every Day    Packs/day: 0.50    Years: 15.00    Pack years: 7.50    Types: Cigarettes   Smokeless tobacco: Never   Tobacco comments:    04/10/2019 reports down to 1/2 pack per day  Vaping Use   Vaping Use: Never used  Substance and Sexual Activity   Alcohol use: No   Drug use: No   Sexual activity: Not Currently    Birth control/protection: None  Other Topics Concern   Not on file  Social History Narrative   Caffeine- coffee  1-2 c, 4-5  Mtn Dew daily   Social Determinants of Health   Financial Resource Strain: Not on file  Food Insecurity: Not on file  Transportation Needs: Not on file  Physical Activity: Not on file  Stress: Not on file  Social Connections: Not on file  Intimate Partner Violence: Not on file    Family History  Problem Relation Age of Onset   Cancer Mother    Cancer Father    Cancer Maternal Grandmother    Breast cancer Maternal Grandmother    Breast cancer Paternal Aunt     Past Surgical History:  Procedure Laterality Date   DIAGNOSTIC LAPAROSCOPY  2003   ovarian cyst removed   FOOT SURGERY     LAPAROSCOPIC ASSISTED VAGINAL HYSTERECTOMY N/A 08/12/2014   Procedure: LAPAROSCOPIC ASSISTED VAGINAL HYSTERECTOMY;  Surgeon: Emily Filbert, MD;  Location: [facility];  Service: Gynecology;  Laterality: N/A;   LAPAROSCOPIC SALPINGO OOPHERECTOMY Right 08/12/2014   Procedure: REMOVE RIGHT SALPINGO OOPHORECTOMY;  Surgeon: Emily Filbert, MD;  Location: [facility];  Service: Gynecology;  Laterality: Right;   LAPAROSCOPY N/A 07/17/2012   Procedure: LAPAROSCOPY OPERATIVE;  Surgeon: Mora Bellman, MD;  Location: [facility];  Service: Gynecology;  Laterality: N/A;  left oopherectomy and salpingectomy   MULTIPLE EXTRACTIONS WITH ALVEOLOPLASTY N/A 06/06/2014   Procedure: MULTIPLE EXTRACTIONS WITH ALVEOLOPLASTY/REMOVAL OF TORI;  Surgeon: Gae Bon, DDS;  Location: [facility];  Service: Oral Surgery;  Laterality: N/A;   ovary tumor     cyst   WISDOM TOOTH EXTRACTION      ROS: Review of Systems Negative except as stated above  PHYSICAL EXAM: BP 109/69    Pulse 71    Ht '5\' 8"'$  (1.727 m)    Wt 189 lb 3.2 oz (85.8 kg)    LMP 06/12/2014 (Exact Date)    SpO2 94%    BMI 28.77 kg/m   Physical Exam  General appearance - alert, well appearing, and in no distress Mental status - normal mood, behavior, speech, dress, motor activity, and thought processes Chest - clear to auscultation, no wheezes, rales or rhonchi, symmetric  air entry Heart - normal rate, regular rhythm, normal S1, S2, no murmurs, rubs, clicks or gallops Extremities - peripheral pulses normal, no pedal edema, no clubbing or cyanosis   CMP Latest Ref Rng & Units 05/27/2021 11/19/2017 11/06/2014  Glucose 70 - 99 mg/dL 82 85 102(H)  BUN 6 - 24 mg/dL 8 7 <5(L)  Creatinine 0.57 - 1.00 mg/dL 0.71 0.70 0.62  Sodium 134 - 144 mmol/L 141 139 141  Potassium 3.5 - 5.2 mmol/L 4.5 3.6 3.9  Chloride 96 - 106 mmol/L 103 106 109  CO2 20 - 29 mmol/L '23 25 25  '$ Calcium 8.7 - 10.2 mg/dL 9.5 8.9 9.6  Total Protein 6.0 - 8.5 g/dL  6.6 6.4(L) 6.9  Total Bilirubin 0.0 - 1.2 mg/dL <0.2 0.3 0.6  Alkaline Phos 44 - 121 IU/L 138(H) 87 95  AST 0 - 40 IU/L '28 23 26  '$ ALT 0 - 32 IU/L '22 19 17   '$ Lipid Panel  No results found for: CHOL, TRIG, HDL, CHOLHDL, VLDL, LDLCALC, LDLDIRECT  CBC    Component Value Date/Time   WBC 9.5 05/27/2021 1516   WBC 9.2 11/19/2017 1123   RBC 4.60 05/27/2021 1516   RBC 4.27 11/19/2017 1123   HGB 14.4 05/27/2021 1516   HCT 43.0 05/27/2021 1516   PLT 528 (H) 05/27/2021 1516   MCV 94 05/27/2021 1516   MCH 31.3 05/27/2021 1516   MCH 30.7 11/19/2017 1123   MCHC 33.5 05/27/2021 1516   MCHC 32.2 11/19/2017 1123   RDW 13.1 05/27/2021 1516   LYMPHSABS 2.4 11/06/2014 0950   MONOABS 0.6 11/06/2014 0950   EOSABS 0.3 11/06/2014 0950   BASOSABS 0.1 11/06/2014 0950    ASSESSMENT AND PLAN: 1. Migraine without aura and without status migrainosus, not intractable Patient reports good abortive therapy with Imitrex.  Advised the importance of taking the medication at the start of the headache for best results.  She has a follow-up appointment with neurology next month  2. Tobacco dependence Commended her on cutting back and on her desire to quit.  She will continue to use the nicotine patches to help with smoking cessation  3. Peripheral polyneuropathy I recommend doing nerve conduction study through neurology.  Patient is agreeable to this. -  NCV with EMG(electromyography); Future  4. Thrombocytosis Even though this is chronic, I recommend referral to hematology to have this evaluated - Ambulatory referral to Hematology / Oncology    [employer] Language interpreter used during this encounter. #[id number] Griffins  Patient was given the opportunity to ask questions.  Patient verbalized understanding of the plan and was able to repeat key elements of the plan.   This documentation was completed using Radio producer.  Any transcriptional errors are unintentional.  Orders Placed This Encounter  Procedures   Ambulatory referral to Hematology / Oncology   NCV with EMG(electromyography)     Requested Prescriptions   Signed Prescriptions Disp Refills   topiramate (TOPAMAX) 25 MG tablet 30 tablet 3    Sig: Take 1 tablet (25 mg total) by mouth at bedtime.    Return in about 4 months (around 11/26/2021).  Karle Plumber, MD, FACP

## 2021-07-28 ENCOUNTER — Telehealth: Payer: Self-pay | Admitting: Physician Assistant

## 2021-07-28 NOTE — Telephone Encounter (Signed)
Scheduled appt per 3/7 referral. Pt is aware of appt date and time. Pt is aware to arrive 15 mins prior to appt time and to bring and updated insurance card. Pt is aware of appt location.   

## 2021-08-08 ENCOUNTER — Other Ambulatory Visit: Payer: Self-pay | Admitting: Physician Assistant

## 2021-08-08 DIAGNOSIS — R7989 Other specified abnormal findings of blood chemistry: Secondary | ICD-10-CM

## 2021-08-08 NOTE — Progress Notes (Signed)
? ? [place] ?Telephone:(336) [phone]   [phone] ? ?CONSULT NOTE ? ?REFERRING PHYSICIAN: Dr. Wynetta Emery ? ?REASON FOR CONSULTATION:  ?Thrombocytosis ? ?HPI ?Latasha Parker is a 46 y.o. female with a past medical history significant for congenital deafness, COPD, fibromyalgia, tobacco dependence, endometriosis, anxiety, peripheral neuropathy, generalized anxiety disorder, dysmenorrhagia and migraines is referred to the clinic for thrombocytosis.  ? ?The patient had a follow-up visit with her PCP on 07/27/2021 for follow-up for COPD, tobacco abuse, endometriosis, and anxiety.  She also has been having concerns related to headaches and tinnitus.  She is followed by neurology.  They reportedly did a brain MRI which showed borderline Chiari malformation.  She also intermittently has tingling in her legs bilaterally. They are reportedly going to perform a nerve conduction study soon. She was checked for diabetes and vitamin B12 deficiency which were negative.  The patient has had chronic elevated platelet count dating back to 2017.  Her platelet count has ranged from 417-528.  She was referred to the clinic for further evaluation regarding this finding. ? ? Per chart review, it appers that she had similar findings dating back at least 7 years ago. The oldest records I have are from 2013, she did have an elevated platelet count of 454k at that time, which then returned to normal for a few years. However, her platelet count has been more persistently elevated the last few years.  ?  ?Today the patient is feeling fairly well without any concerning complaints. She denies any fever, chills, night sweats, or lymphadenopathy. She used to get hot flashes during menopause ~4 several years ago for which she used hormone patches. She no longer has hot flashes or uses any hormones/steroids. She rarely uses her flovent inhaler. She occasionally gets nausea/vomiting from her Chiari malformation. She is  prescribed phenergan. She denies any diarrhea, constipation, abdominal pain, or abdominal fullness. She used to get abdominal bloating/indigestion but this has improved with changing her dietary habits.  ? ?She denies any bleeding or bruising including melena, hematuria, hematochezia, abnormal vaginal bleeding, epistaxis, or gingival bleeding. She is presently not taking  aspirin. She denies any recent infections. She denies any rashes or skin changes. He denies any unusual lightheadedness, syncope, or visual changes.  The patient denies any particular dietary habits such as being vegetarian or vegan. She denies history of DVT, MI, PE, or CVA.  ? ?She denies any family history of any blood or bone marrow disorders.  Her mother had arthritis, fibromyalgia, cervical cancer, and asthma.  She had an aunt with lung cancer.  Her grandmother had breast cancer. ? ?The patient is presently not working.  She is in a 20-year relationship.  She has 2 children.  She has been smoking since age 46 averaging 1 pack/day for most of her life.  She is currently smoking less than 1 pack/day and is trying to quit.  Denies any drug or alcohol use. ? ? ? ?HPI ? ?Past Medical History:  ?Diagnosis Date  ? Anxiety   ? Arthritis   ? Blood transfusion without reported diagnosis 2015  ? COPD (chronic obstructive pulmonary disease) ([place]) 2015  ? Cyst of ovary   ? right  ? Deaf   ? Depression   ? Female bladder prolapse 2019  ? Hyperthyroidism   ? Menorrhagia   ? Neuromuscular disorder ([place]) 2009  ? Pelvic pain   ? SVD (spontaneous vaginal delivery)   ? ? ?Past Surgical History:  ?Procedure Laterality Date  ?  DIAGNOSTIC LAPAROSCOPY  2003  ? ovarian cyst removed  ? FOOT SURGERY    ? LAPAROSCOPIC ASSISTED VAGINAL HYSTERECTOMY N/A 08/12/2014  ? Procedure: LAPAROSCOPIC ASSISTED VAGINAL HYSTERECTOMY;  Surgeon: Emily Filbert, MD;  Location: [facility];  Service: Gynecology;  Laterality: N/A;  ? LAPAROSCOPIC SALPINGO OOPHERECTOMY Right 08/12/2014  ? Procedure:  REMOVE RIGHT SALPINGO OOPHORECTOMY;  Surgeon: Emily Filbert, MD;  Location: [facility];  Service: Gynecology;  Laterality: Right;  ? LAPAROSCOPY N/A 07/17/2012  ? Procedure: LAPAROSCOPY OPERATIVE;  Surgeon: Mora Bellman, MD;  Location: [facility];  Service: Gynecology;  Laterality: N/A;  left oopherectomy and salpingectomy  ? MULTIPLE EXTRACTIONS WITH ALVEOLOPLASTY N/A 06/06/2014  ? Procedure: MULTIPLE EXTRACTIONS WITH ALVEOLOPLASTY/REMOVAL OF TORI;  Surgeon: Gae Bon, DDS;  Location: [facility];  Service: Oral Surgery;  Laterality: N/A;  ? ovary tumor    ? cyst  ? WISDOM TOOTH EXTRACTION    ? ? ?Family History  ?Problem Relation Age of Onset  ? Cancer Mother   ? Cancer Father   ? Cancer Maternal Grandmother   ? Breast cancer Maternal Grandmother   ? Breast cancer Paternal Aunt   ? ? ?Social History ?Social History  ? ?Tobacco Use  ? Smoking status: Every Day  ?  Packs/day: 0.50  ?  Years: 15.00  ?  Pack years: 7.50  ?  Types: Cigarettes  ? Smokeless tobacco: Never  ? Tobacco comments:  ?  04/10/2019 reports down to 1/2 pack per day  ?Vaping Use  ? Vaping Use: Never used  ?Substance Use Topics  ? Alcohol use: No  ? Drug use: No  ? ? ?Allergies  ?Allergen Reactions  ? Tramadol Nausea And Vomiting  ? Vicodin [Hydrocodone-Acetaminophen] Nausea And Vomiting  ? ? ?Current Outpatient Medications  ?Medication Sig Dispense Refill  ? FLOVENT HFA 44 MCG/ACT inhaler INHALE 2 PUFFS BY MOUTH TWICE A DAY 31.8 Inhaler 3  ? nicotine (NICODERM CQ - DOSED IN MG/24 HOURS) 21 mg/24hr patch Place 1 patch (21 mg total) onto the skin daily. 28 patch 0  ? promethazine (PHENERGAN) 12.5 MG tablet 1 tab PO daily PRN for nausea/vomiting 15 tablet 0  ? SUMAtriptan (IMITREX) 50 MG tablet 1 tab PO at start of headache, may repeat in 2 hrs if no relief.  Max 2tabs/24 hrs 10 tablet 1  ? topiramate (TOPAMAX) 25 MG tablet Take 1 tablet (25 mg total) by mouth at bedtime. 30 tablet 3  ? ?No current facility-administered medications for this visit.  ? ? ?REVIEW  OF SYSTEMS:   ?Review of Systems  ?Constitutional: Negative for appetite change, chills, fatigue, fever and unexpected weight change.  ?HENT:   Negative for mouth sores, nosebleeds, sore throat and trouble swallowing.   ?Eyes: Negative for eye problems and icterus.  ?Respiratory: Negative for cough, hemoptysis, shortness of breath and wheezing.   ?Cardiovascular: Negative for chest pain and leg swelling.  ?Gastrointestinal: Positive for occasional nausea.  Negative for abdominal pain, constipation, diarrhea, and vomiting.  ?Genitourinary: Negative for bladder incontinence, difficulty urinating, dysuria, frequency and hematuria.   ?Musculoskeletal: Negative for back pain, gait problem, neck pain and neck stiffness.  ?Skin: Negative for itching and rash.  ?Neurological: Positive for occasional headaches.  Positive for peripheral neuropathy.  Negative for dizziness, extremity weakness, gait problem, light-headedness and seizures.  ?Hematological: Negative for adenopathy. Does not bruise/bleed easily.  ?Psychiatric/Behavioral: Negative for confusion, depression and sleep disturbance. The patient is not nervous/anxious.   ? ? ?PHYSICAL EXAMINATION:  ?Blood pressure  108/67, pulse 83, temperature 98.5 ?F (36.9 ?C), temperature source Oral, resp. rate 18, height '5\' 8"'$  (1.727 m), weight 189 lb (85.7 kg), last menstrual period 06/12/2014, SpO2 95 %. ? ?ECOG PERFORMANCE STATUS: 0 ? ?Physical Exam  ?Constitutional: Oriented to person, place, and time and well-developed, well-nourished, and in no distress.  ?HENT:  ?Head: Normocephalic and atraumatic.  ?Mouth/Throat: Oropharynx is clear and moist. No oropharyngeal exudate.  ?Eyes: Conjunctivae are normal. Right eye exhibits no discharge. Left eye exhibits no discharge. No scleral icterus.  ?Neck: Normal range of motion. Neck supple.  ?Cardiovascular: Normal rate, regular rhythm, normal heart sounds and intact distal pulses.   ?Pulmonary/Chest: Effort normal and breath sounds  normal. No respiratory distress. No wheezes. No rales.  ?Abdominal: Soft. Bowel sounds are normal. Exhibits no distension and no mass. There is no tenderness.  ?Musculoskeletal: Normal range of motion. Exhibits

## 2021-08-10 ENCOUNTER — Inpatient Hospital Stay: Payer: Medicaid Other | Attending: Physician Assistant | Admitting: Physician Assistant

## 2021-08-10 ENCOUNTER — Inpatient Hospital Stay: Payer: Medicaid Other

## 2021-08-10 ENCOUNTER — Other Ambulatory Visit: Payer: Self-pay

## 2021-08-10 VITALS — BP 108/67 | HR 83 | Temp 98.5°F | Resp 18 | Ht 68.0 in | Wt 189.0 lb

## 2021-08-10 DIAGNOSIS — R7989 Other specified abnormal findings of blood chemistry: Secondary | ICD-10-CM

## 2021-08-10 DIAGNOSIS — J449 Chronic obstructive pulmonary disease, unspecified: Secondary | ICD-10-CM | POA: Diagnosis not present

## 2021-08-10 DIAGNOSIS — D75839 Thrombocytosis, unspecified: Secondary | ICD-10-CM | POA: Insufficient documentation

## 2021-08-10 DIAGNOSIS — E0590 Thyrotoxicosis, unspecified without thyrotoxic crisis or storm: Secondary | ICD-10-CM | POA: Insufficient documentation

## 2021-08-10 DIAGNOSIS — F17210 Nicotine dependence, cigarettes, uncomplicated: Secondary | ICD-10-CM | POA: Diagnosis not present

## 2021-08-10 LAB — CBC WITH DIFFERENTIAL (CANCER CENTER ONLY)
Abs Immature Granulocytes: 0.02 10*3/uL (ref 0.00–0.07)
Basophils Absolute: 0.1 10*3/uL (ref 0.0–0.1)
Basophils Relative: 2 %
Eosinophils Absolute: 0.3 10*3/uL (ref 0.0–0.5)
Eosinophils Relative: 4 %
HCT: 40.7 % (ref 36.0–46.0)
Hemoglobin: 13.9 g/dL (ref 12.0–15.0)
Immature Granulocytes: 0 %
Lymphocytes Relative: 39 %
Lymphs Abs: 3.5 10*3/uL (ref 0.7–4.0)
MCH: 31.2 pg (ref 26.0–34.0)
MCHC: 34.2 g/dL (ref 30.0–36.0)
MCV: 91.3 fL (ref 80.0–100.0)
Monocytes Absolute: 0.9 10*3/uL (ref 0.1–1.0)
Monocytes Relative: 10 %
Neutro Abs: 4.1 10*3/uL (ref 1.7–7.7)
Neutrophils Relative %: 45 %
Platelet Count: 495 10*3/uL — ABNORMAL HIGH (ref 150–400)
RBC: 4.46 MIL/uL (ref 3.87–5.11)
RDW: 14.4 % (ref 11.5–15.5)
WBC Count: 8.9 10*3/uL (ref 4.0–10.5)
nRBC: 0 % (ref 0.0–0.2)

## 2021-08-10 LAB — LACTATE DEHYDROGENASE: LDH: 206 U/L — ABNORMAL HIGH (ref 98–192)

## 2021-08-10 LAB — CMP (CANCER CENTER ONLY)
ALT: 17 U/L (ref 0–44)
AST: 19 U/L (ref 15–41)
Albumin: 4.2 g/dL (ref 3.5–5.0)
Alkaline Phosphatase: 113 U/L (ref 38–126)
Anion gap: 7 (ref 5–15)
BUN: 13 mg/dL (ref 6–20)
CO2: 26 mmol/L (ref 22–32)
Calcium: 9.2 mg/dL (ref 8.9–10.3)
Chloride: 106 mmol/L (ref 98–111)
Creatinine: 0.77 mg/dL (ref 0.44–1.00)
GFR, Estimated: >60 mL/min (ref >60)
Glucose, Bld: 93 mg/dL (ref 70–99)
Potassium: 3.7 mmol/L (ref 3.5–5.1)
Sodium: 139 mmol/L (ref 135–145)
Total Bilirubin: 0.3 mg/dL (ref 0.3–1.2)
Total Protein: 7.1 g/dL (ref 6.5–8.1)

## 2021-08-10 LAB — IRON AND IRON BINDING CAPACITY (CC-WL,HP ONLY)
Iron: 86 ug/dL (ref 28–170)
Saturation Ratios: 25 % (ref 10.4–31.8)
TIBC: 349 ug/dL (ref 250–450)
UIBC: 263 ug/dL (ref 148–442)

## 2021-08-10 LAB — FERRITIN: Ferritin: 41 ng/mL (ref 11–307)

## 2021-08-16 LAB — JAK2 (INCLUDING V617F AND EXON 12), MPL,& CALR W/RFL MPN PANEL (NGS)

## 2021-08-31 ENCOUNTER — Inpatient Hospital Stay: Payer: Medicaid Other | Attending: Internal Medicine | Admitting: Internal Medicine

## 2021-08-31 ENCOUNTER — Other Ambulatory Visit: Payer: Self-pay

## 2021-08-31 ENCOUNTER — Inpatient Hospital Stay: Payer: Medicaid Other

## 2021-08-31 DIAGNOSIS — F17210 Nicotine dependence, cigarettes, uncomplicated: Secondary | ICD-10-CM | POA: Insufficient documentation

## 2021-08-31 DIAGNOSIS — R7989 Other specified abnormal findings of blood chemistry: Secondary | ICD-10-CM

## 2021-08-31 DIAGNOSIS — D75839 Thrombocytosis, unspecified: Secondary | ICD-10-CM | POA: Diagnosis not present

## 2021-08-31 LAB — CBC WITH DIFFERENTIAL (CANCER CENTER ONLY)
Abs Immature Granulocytes: 0.01 10*3/uL (ref 0.00–0.07)
Basophils Absolute: 0.1 10*3/uL (ref 0.0–0.1)
Basophils Relative: 2 %
Eosinophils Absolute: 0.3 10*3/uL (ref 0.0–0.5)
Eosinophils Relative: 3 %
HCT: 39.6 % (ref 36.0–46.0)
Hemoglobin: 13.4 g/dL (ref 12.0–15.0)
Immature Granulocytes: 0 %
Lymphocytes Relative: 39 %
Lymphs Abs: 3.1 10*3/uL (ref 0.7–4.0)
MCH: 30.8 pg (ref 26.0–34.0)
MCHC: 33.8 g/dL (ref 30.0–36.0)
MCV: 91 fL (ref 80.0–100.0)
Monocytes Absolute: 0.7 10*3/uL (ref 0.1–1.0)
Monocytes Relative: 9 %
Neutro Abs: 3.7 10*3/uL (ref 1.7–7.7)
Neutrophils Relative %: 47 %
Platelet Count: 490 10*3/uL — ABNORMAL HIGH (ref 150–400)
RBC: 4.35 MIL/uL (ref 3.87–5.11)
RDW: 14.3 % (ref 11.5–15.5)
WBC Count: 7.9 10*3/uL (ref 4.0–10.5)
nRBC: 0 % (ref 0.0–0.2)

## 2021-08-31 NOTE — Progress Notes (Signed)
?    [place] ?Telephone:(336) [phone]   [phone] ? ?OFFICE PROGRESS NOTE ? ?Ladell Pier, MD ?[place] ?[place] [address] ? ?DIAGNOSIS: Reactive thrombocytosis.  The patient has negative JAK2 mutation panel. ? ?PRIOR THERAPY: None ? ?CURRENT THERAPY: Observation ? ?INTERVAL HISTORY: ?Latasha Parker 46 y.o. female returns to the clinic today for follow-up visit accompanied by her sign interpreter.  The patient is feeling fine today with no concerning complaints.  She denied having any current chest pain, shortness of breath, cough or hemoptysis.  She has no nausea, vomiting, diarrhea or constipation.  She has no headache or visual changes.  She has no recent weight loss or night sweats.  She had molecular studies for JAK2 mutation and she is here for discussion of her lab results and recommendation regarding her condition.  Her previous blood work was unremarkable for any iron deficiency. ? ?MEDICAL HISTORY: ?Past Medical History:  ?Diagnosis Date  ? Anxiety   ? Arthritis   ? Blood transfusion without reported diagnosis 2015  ? COPD (chronic obstructive pulmonary disease) ([place]) 2015  ? Cyst of ovary   ? right  ? Deaf   ? Depression   ? Female bladder prolapse 2019  ? Hyperthyroidism   ? Menorrhagia   ? Neuromuscular disorder ([place]) 2009  ? Pelvic pain   ? SVD (spontaneous vaginal delivery)   ? ? ?ALLERGIES:  is allergic to tramadol and vicodin [hydrocodone-acetaminophen]. ? ?MEDICATIONS:  ?Current Outpatient Medications  ?Medication Sig Dispense Refill  ? FLOVENT HFA 44 MCG/ACT inhaler INHALE 2 PUFFS BY MOUTH TWICE A DAY 31.8 Inhaler 3  ? nicotine (NICODERM CQ - DOSED IN MG/24 HOURS) 21 mg/24hr patch Place 1 patch (21 mg total) onto the skin daily. 28 patch 0  ? promethazine (PHENERGAN) 12.5 MG tablet 1 tab PO daily PRN for nausea/vomiting 15 tablet 0  ? SUMAtriptan (IMITREX) 50 MG tablet 1 tab PO at start of headache, may repeat in 2 hrs if no relief.  Max  2tabs/24 hrs 10 tablet 1  ? topiramate (TOPAMAX) 25 MG tablet Take 1 tablet (25 mg total) by mouth at bedtime. 30 tablet 3  ? ?No current facility-administered medications for this visit.  ? ? ?SURGICAL HISTORY:  ?Past Surgical History:  ?Procedure Laterality Date  ? DIAGNOSTIC LAPAROSCOPY  2003  ? ovarian cyst removed  ? FOOT SURGERY    ? LAPAROSCOPIC ASSISTED VAGINAL HYSTERECTOMY N/A 08/12/2014  ? Procedure: LAPAROSCOPIC ASSISTED VAGINAL HYSTERECTOMY;  Surgeon: Emily Filbert, MD;  Location: [facility];  Service: Gynecology;  Laterality: N/A;  ? LAPAROSCOPIC SALPINGO OOPHERECTOMY Right 08/12/2014  ? Procedure: REMOVE RIGHT SALPINGO OOPHORECTOMY;  Surgeon: Emily Filbert, MD;  Location: [facility];  Service: Gynecology;  Laterality: Right;  ? LAPAROSCOPY N/A 07/17/2012  ? Procedure: LAPAROSCOPY OPERATIVE;  Surgeon: Mora Bellman, MD;  Location: [facility];  Service: Gynecology;  Laterality: N/A;  left oopherectomy and salpingectomy  ? MULTIPLE EXTRACTIONS WITH ALVEOLOPLASTY N/A 06/06/2014  ? Procedure: MULTIPLE EXTRACTIONS WITH ALVEOLOPLASTY/REMOVAL OF TORI;  Surgeon: Gae Bon, DDS;  Location: [facility];  Service: Oral Surgery;  Laterality: N/A;  ? ovary tumor    ? cyst  ? WISDOM TOOTH EXTRACTION    ? ? ?REVIEW OF SYSTEMS:  A comprehensive review of systems was negative.  ? ?PHYSICAL EXAMINATION: General appearance: alert, cooperative, and no distress ?Head: Normocephalic, without obvious abnormality, atraumatic ?Neck: no adenopathy, no JVD, supple, symmetrical, trachea midline, and thyroid not  enlarged, symmetric, no tenderness/mass/nodules ?Lymph nodes: Cervical, supraclavicular, and axillary nodes normal. ?Resp: clear to auscultation bilaterally ?Back: symmetric, no curvature. ROM normal. No CVA tenderness. ?Cardio: regular rate and rhythm, S1, S2 normal, no murmur, click, rub or gallop ?GI: soft, non-tender; bowel sounds normal; no masses,  no organomegaly ?Extremities: extremities normal, atraumatic, no cyanosis or edema ? ?ECOG  PERFORMANCE STATUS: 0 - Asymptomatic ? ?Blood pressure 115/75, pulse 78, temperature (!) 97.3 ?F (36.3 ?C), temperature source Tympanic, resp. rate 19, height '5\' 8"'$  (1.727 m), weight 187 lb 14.4 oz (85.2 kg), last menstrual period 06/12/2014, SpO2 97 %. ? ?LABORATORY DATA: ?Lab Results  ?Component Value Date  ? WBC 7.9 08/31/2021  ? HGB 13.4 08/31/2021  ? HCT 39.6 08/31/2021  ? MCV 91.0 08/31/2021  ? PLT 490 (H) 08/31/2021  ? ? ?  Chemistry   ?   ?Component Value Date/Time  ? NA 139 08/10/2021 1322  ? NA 141 05/27/2021 1516  ? K 3.7 08/10/2021 1322  ? CL 106 08/10/2021 1322  ? CO2 26 08/10/2021 1322  ? BUN 13 08/10/2021 1322  ? BUN 8 05/27/2021 1516  ? CREATININE 0.77 08/10/2021 1322  ?    ?Component Value Date/Time  ? CALCIUM 9.2 08/10/2021 1322  ? ALKPHOS 113 08/10/2021 1322  ? AST 19 08/10/2021 1322  ? ALT 17 08/10/2021 1322  ? BILITOT 0.3 08/10/2021 1322  ?  ? ? ? ?RADIOGRAPHIC STUDIES: ?No results found. ? 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  The patient has JAK2 mutation panel performed that was normal. ?Repeat CBC today showed platelets count of 490,000. ?I explained to the patient that there is no clear evidence for essential thrombocythemia based on the recent blood work and this is likely reactive in nature. ?I recommended for her to continue her routine follow-up visit and evaluation by her primary care physician from now on.  I will be happy to see her in the future if there is any significant increase in her platelets count in the range of 700000-800000 or more. ?The patient was also advised to quit smoking. ?She was advised to call immediately if she has any other concerning issues in the interval. ?The patient voices understanding of current disease status and treatment options and is in agreement with the current care plan. ? ?All questions were  answered. The patient knows to call the clinic with any problems, questions or concerns. We can certainly see the patient much sooner if necessary. ? ?The total time spent in the appointment was 20 minutes. ? ?Disclaimer: This note was dictated with voice recognition software. Similar sounding words can inadvertently be transcribed and may not be corrected upon review. ? ? ?  ?   ?

## 2021-09-06 ENCOUNTER — Ambulatory Visit: Payer: Medicaid Other | Admitting: Psychiatry

## 2021-09-20 ENCOUNTER — Telehealth: Payer: Self-pay | Admitting: Psychiatry

## 2021-09-20 DIAGNOSIS — H9312 Tinnitus, left ear: Secondary | ICD-10-CM | POA: Diagnosis not present

## 2021-09-20 NOTE — Telephone Encounter (Signed)
Called patient through deaf interpreter service.  Informed her the tonsil reference in her MRI is referring to area in her brain not her throat.  She stated she understood. She had questions about ringing in her ears and mentioned issue with her legs. I noted her FU in April was canceled, so we rescheduled a follow up so she can discuss with MD. Patient verbalized understanding, appreciation. ? ?

## 2021-09-20 NOTE — Telephone Encounter (Signed)
Pt said was told by the ENT did not have low lying cerebellar tonsils. Would like a call from the nurse to discuss. ?

## 2021-09-27 ENCOUNTER — Ambulatory Visit: Payer: Medicaid Other | Admitting: Psychiatry

## 2021-09-27 VITALS — BP 106/82 | HR 76 | Wt 188.0 lb

## 2021-09-27 DIAGNOSIS — R200 Anesthesia of skin: Secondary | ICD-10-CM | POA: Diagnosis not present

## 2021-09-27 DIAGNOSIS — G629 Polyneuropathy, unspecified: Secondary | ICD-10-CM | POA: Diagnosis not present

## 2021-09-27 DIAGNOSIS — R202 Paresthesia of skin: Secondary | ICD-10-CM | POA: Diagnosis not present

## 2021-09-27 DIAGNOSIS — M542 Cervicalgia: Secondary | ICD-10-CM

## 2021-09-27 DIAGNOSIS — M5481 Occipital neuralgia: Secondary | ICD-10-CM

## 2021-09-27 DIAGNOSIS — G43009 Migraine without aura, not intractable, without status migrainosus: Secondary | ICD-10-CM

## 2021-09-27 MED ORDER — DULOXETINE HCL 20 MG PO CPEP: 20.0000 mg | ORAL_CAPSULE | Freq: Every day | ORAL | 3 refills | Status: DC

## 2021-09-27 NOTE — Patient Instructions (Addendum)
EMG to test for signs of nerve damage ?Blood work to look for autoimmune conditions ?Start cymbalta 20 mg daily  ?Referral to physical therapy for the neck ?

## 2021-09-27 NOTE — Progress Notes (Signed)
? ?  CC:  headaches ? ?Follow-up Visit ? ?Last visit: 07/06/21 ? ?Brief HPI: ?46 year old female with a history of COPD, anxiety, congenital deafness who follows in clinic for migraines and left sided tinnitus. ? ?At her last visit MRI brain was ordered. ? ?Interval History: ?Migraines are well-controlled with Imitrex. She saw ENT for tinnitus and was told everything was normal. Today she denies ringing in the ears, stating it is more of a sensation of "fluttering and tingling" on the left side from her temple down to her neck. States her skin can be sensitive and painful when this occurs. ? ?She also feels tingling and burning in her lower legs and feet. A1c, TSH, and B12 levels were normal in January. She is concerned because she has a family history of autoimmune disease. ? ?MRI brain showed low lying cerebellar tonsils (88m), consistent with borderline Chiari malformation. Denies occipital headaches or headaches associated with coughing/sneezing/valsalva. ? ?Current Headache Regimen: ?Preventative: none ?Abortive: Imitrex 50 mg PRN ? ?Prior Therapies                                  ?Imitrex 50 mg PRN ?Topamax 25 mg QHS - tingling ?Aleve ? ?Physical Exam:  ? ?Vital Signs: ?LMP 06/12/2014 (Exact Date)  ?GENERAL:  well appearing, in no acute distress, alert  ?SKIN:  Color, texture, turgor normal. No rashes or lesions ?HEAD:  Normocephalic/atraumatic. ?RESP: normal respiratory effort ?MSK:  +tenderness to palpation over left occipital notch and neck ? ?NEUROLOGICAL: ?Mental Status: Alert, oriented to person, place and time, Follows commands, and Speech fluent and appropriate. ?Cranial Nerves: PERRL, face symmetric, no dysarthria, deaf ?Motor: moves all extremities equally ?Gait: normal-based. ? ?IMPRESSION: ?46year old female with a history of COPD, anxiety, congenital deafness who presents for follow up of headaches. Migraines are well-controlled with Imitrex. Suspect her left sided symptoms are secondary to  occipital neuralgia. Will refer to neck PT and start Cymbalta for headache prevention. Will order EMG to assess for peripheral neuropathy. ? ?PLAN: ?-Blood work: ENA panel ?-EMG/NCS to assess for peripheral neuropathy ?-Prevention: Start Cymbalta 20 mg daily ?-Rescue: Continue Imitrex 50 mg PRN ?-Referral to neck PT ? ?Follow-up: 4 months ? ?I spent a total of 39 minutes on the date of the service. Headache education was done.  Discussed treatment options including preventive and acute medications. Discussed medication side effects, adverse reactions and drug interactions. Written educational materials and patient instructions outlining all of the above were given. ? ?JGenia Harold MD ?09/27/21 ?4:18 PM ? ?

## 2021-09-28 ENCOUNTER — Telehealth: Payer: Self-pay

## 2021-09-28 NOTE — Patient Outreach (Signed)
?Medicaid Managed Care ?Social Work Note ? ?09/28/2021 ?Name:  Latasha Parker MRN:  [id number] DOB:  1976/04/06 ? ?Latasha Parker is an 46 y.o. year old female who is a primary patient of Latasha Pier, MD.  The [facility] Managed Care Coordination team was consulted for assistance with:  Food Insecurity ?Care management  ? ?Latasha Parker was given information about Medicaid Managed Care Coordination team services today. Latasha Parker Patient agreed to services and verbal consent obtained. ? ?Engaged with patient  for by telephone forinitial visit in response to referral for case management and/or care coordination services.  ? ?Assessments/Interventions:  Review of past medical history, allergies, medications, health status, including review of consultants reports, laboratory and other test data, was performed as part of comprehensive evaluation and provision of chronic care management services. ? ?SDOH: (Social Determinant of Health) assessments and interventions performed: ?BSW received a referral from [facility] for food and care coordination. BSW spoke with patient and she stated she does not receive foodstamps, she applied a long time ago and was denied. BSW informed patient she could apply for Foodstamps online or in the office, BSW provided patient with the telephone number and web address. No other resources needed at this time.  ? ?Advanced Directives Status:  Not addressed in this encounter. ? ?Care Plan ?                ?Allergies  ?Allergen Reactions  ? Tramadol Nausea And Vomiting  ? Vicodin [Hydrocodone-Acetaminophen] Nausea And Vomiting  ? ? ?Medications Reviewed Today   ? ? Reviewed by Anda Latina, RN (Registered Nurse) on 09/27/21 at 1541  Med List Status: <None>  ? ?Medication Order Taking? Sig Documenting Provider Last Dose Status Informant  ?FLOVENT HFA 44 MCG/ACT inhaler [id number] Yes INHALE 2 PUFFS BY MOUTH TWICE A DAY Sood, Vineet, MD Taking Active Multiple Informants  ?nicotine (NICODERM CQ -  DOSED IN MG/24 HOURS) 21 mg/24hr patch [id number] Yes Place 1 patch (21 mg total) onto the skin daily. Latasha Pier, MD Taking Active   ?promethazine (PHENERGAN) 12.5 MG tablet [id number] Yes 1 tab PO daily PRN for nausea/vomiting Latasha Pier, MD Taking Active   ?SUMAtriptan (IMITREX) 50 MG tablet [id number] Yes 1 tab PO at start of headache, may repeat in 2 hrs if no relief.  Max 2tabs/24 hrs Latasha Pier, MD Taking Active   ? ?  ?  ? ?  ? ? ?Patient Active Problem List  ? Diagnosis Date Noted  ? Thrombocytosis 08/31/2021  ? Elevated platelet count 08/10/2021  ? Migraine without aura and without status migrainosus, not intractable 05/27/2021  ? Tinnitus of right ear 05/27/2021  ? Congenital deafness 05/27/2021  ? Peripheral polyneuropathy 05/27/2021  ? Tobacco dependence 05/27/2021  ? Generalized anxiety disorder 05/27/2021  ? Anxiety 08/14/2018  ? Post-operative state 08/12/2014  ? Dermoid cyst 06/19/2014  ? Health care maintenance 10/09/2013  ? Right knee pain 10/09/2013  ? Insomnia 09/09/2013  ? COPD (chronic obstructive pulmonary disease) with chronic bronchitis ([place]) 09/09/2013  ? Tobacco abuse 09/09/2013  ? Endometriosis 11/27/2012  ? Menorrhagia 01/13/2012  ? Dysmenorrhea 01/13/2012  ? Back pain 01/13/2012  ? Ovarian cyst 01/13/2012  ? ? ?Conditions to be addressed/monitored per PCP order:   Food and care coordination ? ?There are no care plans that you recently modified to display for this patient. ? ? ?Follow up:  Patient agrees to Care Plan and Follow-up. ? ?Plan: The Managed Medicaid care management team  will reach out to the patient again over the next 30 days. ? ?Date/time of next scheduled Social Work care management/care coordination outreach:  10/29/21 ? ?Mickel Fuchs, BSW, [place] ?[place]  ?High Risk Managed Medicaid Team  ?[phone]  ?

## 2021-09-28 NOTE — Patient Instructions (Signed)
Visit Information ? ?Ms. Delisle was given information about Medicaid Managed Care team care coordination services as a part of their [facility] Medicaid benefit. [name] did not consent to engagement with the [facility] Managed Care team.  ? ?If you are experiencing a medical emergency, please call 911 or report to your local emergency department or urgent care.  ? ?If you have a non-emergency medical problem during routine business hours, please contact your provider's office and ask to speak with a nurse.  ? ?For questions related to your [facility] health plan, please call: [phone] or go here:[email or web address] ? ?If you would like to schedule transportation through your [facility] plan, please call the following number at least 2 days in advance of your appointment: [phone]. ? You can also use the MTM portal or MTM mobile app to manage your rides. For the portal, please go to [email or web address]. ? ?Call the [place] at [phone], at any time, 24 hours a day, 7 days a week. If you are in danger or need immediate medical attention call 911. ? ?If you would like help to quit smoking, call 1-800-QUIT-NOW (626)200-4330) OR Espa?ol: 1-855-D?jelo-Ya (743)108-8471) o para m?s informaci?n haga clic aqu? or Text READY to 200-400 to register via text ? ?Ms. Rowen - following are the goals we discussed in your visit today:  ? Goals Addressed   ?None ?  ? ? ? ?Social Worker will follow up in 30 days .  ? ?Mickel Fuchs, BSW, [place] ?[place]  ?High Risk Managed Medicaid Team  ?[phone]  ? ?Following is a copy of your plan of care:  ?There are no care plans that you recently modified to display for this patient. ?  ?

## 2021-09-29 ENCOUNTER — Telehealth: Payer: Self-pay | Admitting: Psychiatry

## 2021-09-29 NOTE — Telephone Encounter (Signed)
Order for NCV/EMG sent to [facility] [phone]. 

## 2021-09-30 ENCOUNTER — Telehealth: Payer: Self-pay

## 2021-09-30 LAB — ANA+ENA+DNA/DS+SCL 70+SJOSSA/B
ANA Titer 1: NEGATIVE
ENA RNP Ab: <0.2 AI (ref 0.0–0.9)
ENA SM Ab Ser-aCnc: <0.2 AI (ref 0.0–0.9)
ENA SSA (RO) Ab: <0.2 AI (ref 0.0–0.9)
ENA SSB (LA) Ab: <0.2 AI (ref 0.0–0.9)
Scleroderma (Scl-70) (ENA) Antibody, IgG: <0.2 AI (ref 0.0–0.9)
dsDNA Ab: 1 IU/mL (ref 0–9)

## 2021-09-30 NOTE — Telephone Encounter (Signed)
I called pt and left vm through sign language interpreter service on lab results. ( [place] per dpr). ? ?Pt was advised to call back if she had questions/concerns. ?

## 2021-09-30 NOTE — Telephone Encounter (Signed)
-----   Message from Genia Harold, MD sent at 09/30/2021  9:04 AM EDT ----- ?Blood work is normal ?

## 2021-10-08 ENCOUNTER — Telehealth: Payer: Self-pay | Admitting: Psychiatry

## 2021-10-08 NOTE — Telephone Encounter (Signed)
Pt said need to know if DULoxetine (CYMBALTA) 20 MG capsule itself take some time to work. Have been taking for 5 to 10 days now and still having the same symptoms. Wondering when I should start seeing improvement. Would like a call from the nurse

## 2021-10-11 NOTE — Telephone Encounter (Signed)
I called the pt back and advised per uptodate this med can take several weeks to be therapeutic within the body. Pt will call closer to weeks 8-10 to let us know how she is doing on this medication. She does report some improvement since starting the medication and she is tolerating it well.

## 2021-10-18 ENCOUNTER — Other Ambulatory Visit: Payer: Self-pay | Admitting: Internal Medicine

## 2021-10-18 DIAGNOSIS — G43009 Migraine without aura, not intractable, without status migrainosus: Secondary | ICD-10-CM

## 2021-10-21 NOTE — Therapy (Incomplete)
OUTPATIENT PHYSICAL THERAPY CERVICAL EVALUATION   Patient Name: Latasha Parker MRN: [id number] DOB:[birth year], 46 y.o., female Today's Date: 10/21/2021    Past Medical History:  Diagnosis Date   Anxiety    Arthritis    Blood transfusion without reported diagnosis 2015   COPD (chronic obstructive pulmonary disease) ([place]) 2015   Cyst of ovary    right   Deaf    Depression    Female bladder prolapse 2019   Hyperthyroidism    Menorrhagia    Neuromuscular disorder ([place]) 2009   Pelvic pain    SVD (spontaneous vaginal delivery)    Past Surgical History:  Procedure Laterality Date   DIAGNOSTIC LAPAROSCOPY  2003   ovarian cyst removed   FOOT SURGERY     LAPAROSCOPIC ASSISTED VAGINAL HYSTERECTOMY N/A 08/12/2014   Procedure: LAPAROSCOPIC ASSISTED VAGINAL HYSTERECTOMY;  Surgeon: Emily Filbert, MD;  Location: [facility];  Service: Gynecology;  Laterality: N/A;   LAPAROSCOPIC SALPINGO OOPHERECTOMY Right 08/12/2014   Procedure: REMOVE RIGHT SALPINGO OOPHORECTOMY;  Surgeon: Emily Filbert, MD;  Location: [facility];  Service: Gynecology;  Laterality: Right;   LAPAROSCOPY N/A 07/17/2012   Procedure: LAPAROSCOPY OPERATIVE;  Surgeon: Mora Bellman, MD;  Location: [facility];  Service: Gynecology;  Laterality: N/A;  left oopherectomy and salpingectomy   MULTIPLE EXTRACTIONS WITH ALVEOLOPLASTY N/A 06/06/2014   Procedure: MULTIPLE EXTRACTIONS WITH ALVEOLOPLASTY/REMOVAL OF TORI;  Surgeon: Gae Bon, DDS;  Location: [facility];  Service: Oral Surgery;  Laterality: N/A;   ovary tumor     cyst   WISDOM TOOTH EXTRACTION     Patient Active Problem List   Diagnosis Date Noted   Thrombocytosis 08/31/2021   Elevated platelet count 08/10/2021   Migraine without aura and without status migrainosus, not intractable 05/27/2021   Tinnitus of right ear 05/27/2021   Congenital deafness 05/27/2021   Peripheral polyneuropathy 05/27/2021   Tobacco dependence 05/27/2021   Generalized anxiety disorder 05/27/2021   Anxiety  08/14/2018   Post-operative state 08/12/2014   Dermoid cyst 06/19/2014   Health care maintenance 10/09/2013   Right knee pain 10/09/2013   Insomnia 09/09/2013   COPD (chronic obstructive pulmonary disease) with chronic bronchitis ([place]) 09/09/2013   Tobacco abuse 09/09/2013   Endometriosis 11/27/2012   Menorrhagia 01/13/2012   Dysmenorrhea 01/13/2012   Back pain 01/13/2012   Ovarian cyst 01/13/2012    PCP: Ladell Pier, MD  REFERRING PROVIDER: Genia Harold, MD  REFERRING DIAG: M54.2 (ICD-10-CM) - Neck pain  THERAPY DIAG:  No diagnosis found.  Rationale for Evaluation and Treatment Rehabilitation  ONSET DATE: ***  SUBJECTIVE:  SUBJECTIVE STATEMENT: ***  PERTINENT HISTORY:  ***  PAIN:  Are you having pain? {OPRCPAIN:27236}  PRECAUTIONS: {Therapy precautions:24002}  WEIGHT BEARING RESTRICTIONS {Yes ***/No:24003}  FALLS:  Has patient fallen in last 6 months? {fallsyesno:27318}  LIVING ENVIRONMENT: Lives with: {[facility] lives with:25569::"lives with their family"} Lives in: {Lives in:25570} Stairs: {opstairs:27293} Has following equipment at home: {Assistive devices:23999}  OCCUPATION: ***  PLOF: {PLOF:24004}  PATIENT GOALS ***  OBJECTIVE:   DIAGNOSTIC FINDINGS:  MR Brain with or without contrast: IMPRESSION: This is a borderline normal MRI of the brain with and without contrast.  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, consistent with a borderline Chiari malformation  PATIENT SURVEYS:  NDI ***   COGNITION: Overall cognitive status: {cognition:24006}   SENSATION: {sensation:27233}  POSTURE: {posture:25561}  PALPATION: ***   CERVICAL ROM:   AROM A/PROM (deg) eval  Flexion   Extension   Right lateral flexion   Left lateral flexion   Right  rotation   Left rotation    (Blank rows = not tested)  UPPER EXTREMITY ROM:  AROM Right eval Left eval  Shoulder flexion    Shoulder abduction    Shoulder internal rotation    Shoulder external rotation     (Blank rows = not tested)  UPPER EXTREMITY MMT:  MMT Right eval Left eval  Shoulder flexion    Shoulder abduction    Shoulder internal rotation    Shoulder external rotation    Middle trapezius    Lower trapezius    Latissimus dorsi    Elbow flexion    Elbow extension    Wrist flexion    Wrist extension    Grip strength     (Blank rows = not tested)  CERVICAL SPECIAL TESTS:  {Cervical special tests:25246}   FUNCTIONAL TESTS:  {Functional tests:24029}   TODAY'S TREATMENT:  ***   PATIENT EDUCATION:  Education details: *** Person educated: {Person educated:25204} Education method: {Education Method:25205} Education comprehension: {Education Comprehension:25206}   HOME EXERCISE PROGRAM: ***  ASSESSMENT:  CLINICAL IMPRESSION: Patient is a *** y.o. *** who was seen today for physical therapy evaluation and treatment for ***.    OBJECTIVE IMPAIRMENTS {opptimpairments:25111}.   ACTIVITY LIMITATIONS {activitylimitations:27494}  PARTICIPATION LIMITATIONS: {participationrestrictions:25113}  PERSONAL FACTORS {Personal factors:25162} are also affecting patient's functional outcome.   REHAB POTENTIAL: {rehabpotential:25112}  CLINICAL DECISION MAKING: {clinical decision making:25114}  EVALUATION COMPLEXITY: {Evaluation complexity:25115}   GOALS: Goals reviewed with patient? Yes  SHORT TERM GOALS: Target date: 11/19/2021   Pt will report understanding and adherence to her HEP in order to promote independence in the management of her primary impairments. Baseline: HEP provided at eval Goal status: INITIAL   LONG TERM GOALS: Target date: 12/17/2021  Pt will achieve an NDI score of *** in order to demonstrate improved functional ability as it  relates to her neck pain. Baseline: *** Goal status: INITIAL  2.  *** Baseline: *** Goal status: {GOALSTATUS:25110}  3.  *** Baseline: *** Goal status: {GOALSTATUS:25110}  4.  *** Baseline: *** Goal status: {GOALSTATUS:25110}  5.  *** Baseline: *** Goal status: {GOALSTATUS:25110}  6.  *** Baseline: *** Goal status: {GOALSTATUS:25110}   PLAN: PT FREQUENCY: {rehab frequency:25116}  PT DURATION: {rehab duration:25117}  PLANNED INTERVENTIONS: {rehab planned interventions:25118::"Therapeutic exercises","Therapeutic activity","Neuromuscular re-education","Balance training","Gait training","Patient/Family education","Joint mobilization"}  PLAN FOR NEXT SESSION: Vanessa Ranburne, PT, DPT 10/21/21 3:21 PM

## 2021-10-22 ENCOUNTER — Ambulatory Visit: Payer: Medicaid Other

## 2021-10-24 NOTE — Therapy (Deleted)
OUTPATIENT PHYSICAL THERAPY CERVICAL EVALUATION   Patient Name: Latasha Parker MRN: [id number] DOB:1975-12-06, 46 y.o., female Today's Date: 10/24/2021    Past Medical History:  Diagnosis Date   Anxiety    Arthritis    Blood transfusion without reported diagnosis 2015   COPD (chronic obstructive pulmonary disease) ([place]) 2015   Cyst of ovary    right   Deaf    Depression    Female bladder prolapse 2019   Hyperthyroidism    Menorrhagia    Neuromuscular disorder ([place]) 2009   Pelvic pain    SVD (spontaneous vaginal delivery)    Past Surgical History:  Procedure Laterality Date   DIAGNOSTIC LAPAROSCOPY  2003   ovarian cyst removed   FOOT SURGERY     LAPAROSCOPIC ASSISTED VAGINAL HYSTERECTOMY N/A 08/12/2014   Procedure: LAPAROSCOPIC ASSISTED VAGINAL HYSTERECTOMY;  Surgeon: Emily Filbert, MD;  Location: [facility];  Service: Gynecology;  Laterality: N/A;   LAPAROSCOPIC SALPINGO OOPHERECTOMY Right 08/12/2014   Procedure: REMOVE RIGHT SALPINGO OOPHORECTOMY;  Surgeon: Emily Filbert, MD;  Location: [facility];  Service: Gynecology;  Laterality: Right;   LAPAROSCOPY N/A 07/17/2012   Procedure: LAPAROSCOPY OPERATIVE;  Surgeon: Mora Bellman, MD;  Location: [facility];  Service: Gynecology;  Laterality: N/A;  left oopherectomy and salpingectomy   MULTIPLE EXTRACTIONS WITH ALVEOLOPLASTY N/A 06/06/2014   Procedure: MULTIPLE EXTRACTIONS WITH ALVEOLOPLASTY/REMOVAL OF TORI;  Surgeon: Gae Bon, DDS;  Location: [facility];  Service: Oral Surgery;  Laterality: N/A;   ovary tumor     cyst   WISDOM TOOTH EXTRACTION     Patient Active Problem List   Diagnosis Date Noted   Thrombocytosis 08/31/2021   Elevated platelet count 08/10/2021   Migraine without aura and without status migrainosus, not intractable 05/27/2021   Tinnitus of right ear 05/27/2021   Congenital deafness 05/27/2021   Peripheral polyneuropathy 05/27/2021   Tobacco dependence 05/27/2021   Generalized anxiety disorder 05/27/2021   Anxiety  08/14/2018   Post-operative state 08/12/2014   Dermoid cyst 06/19/2014   Health care maintenance 10/09/2013   Right knee pain 10/09/2013   Insomnia 09/09/2013   COPD (chronic obstructive pulmonary disease) with chronic bronchitis ([place]) 09/09/2013   Tobacco abuse 09/09/2013   Endometriosis 11/27/2012   Menorrhagia 01/13/2012   Dysmenorrhea 01/13/2012   Back pain 01/13/2012   Ovarian cyst 01/13/2012    PCP: Dr. Karle Plumber    REFERRING PROVIDER: Dr. Genia Harold  REFERRING DIAG: Neck pain   THERAPY DIAG:  No diagnosis found.  Rationale for Evaluation and Treatment Rehabilitation  ONSET DATE: ***  SUBJECTIVE:  SUBJECTIVE STATEMENT: ***  PERTINENT HISTORY:  ***  PAIN:  Are you having pain? {OPRCPAIN:27236}  PRECAUTIONS: {Therapy precautions:24002}  WEIGHT BEARING RESTRICTIONS {Yes ***/No:24003}  FALLS:  Has patient fallen in last 6 months? {fallsyesno:27318}  LIVING ENVIRONMENT: Lives with: {[facility] lives with:25569::"lives with their family"} Lives in: {Lives in:[address]70} Stairs: {opstairs:27293} Has following equipment at home: {Assistive devices:23999}  OCCUPATION: ***  PLOF: {PLOF:24004}  PATIENT GOALS ***  OBJECTIVE:   DIAGNOSTIC FINDINGS:  ***  PATIENT SURVEYS:  {rehab surveys:24030}   COGNITION: Overall cognitive status: {cognition:24006}   SENSATION: {sensation:27233}  POSTURE: {posture:25561}  PALPATION: ***   CERVICAL ROM:   {AROM/PROM:27142} ROM A/PROM (deg) eval  Flexion   Extension   Right lateral flexion   Left lateral flexion   Right rotation   Left rotation    (Blank rows = not tested)  UPPER EXTREMITY ROM:  {AROM/PROM:27142} ROM Right eval Left eval  Shoulder flexion    Shoulder extension    Shoulder abduction     Shoulder adduction    Shoulder extension    Shoulder internal rotation    Shoulder external rotation    Elbow flexion    Elbow extension    Wrist flexion    Wrist extension    Wrist ulnar deviation    Wrist radial deviation    Wrist pronation    Wrist supination     (Blank rows = not tested)  UPPER EXTREMITY MMT:  MMT Right eval Left eval  Shoulder flexion    Shoulder extension    Shoulder abduction    Shoulder adduction    Shoulder extension    Shoulder internal rotation    Shoulder external rotation    Middle trapezius    Lower trapezius    Elbow flexion    Elbow extension    Wrist flexion    Wrist extension    Wrist ulnar deviation    Wrist radial deviation    Wrist pronation    Wrist supination    Grip strength     (Blank rows = not tested)  CERVICAL SPECIAL TESTS:  {Cervical special tests:25246}   FUNCTIONAL TESTS:  {Functional tests:24029}  PATIENT SURVEYS:  {rehab surveys:24030:a}  TODAY'S TREATMENT:  ***   PATIENT EDUCATION:  Education details: *** Person educated: {Person educated:25204} Education method: {Education Method:25205} Education comprehension: {Education Comprehension:25206}   HOME EXERCISE PROGRAM: ***  ASSESSMENT:  CLINICAL IMPRESSION: Patient is a *** y.o. *** who was seen today for physical therapy evaluation and treatment for ***.    OBJECTIVE IMPAIRMENTS {opptimpairments:25111}.   ACTIVITY LIMITATIONS {activitylimitations:27494}  PARTICIPATION LIMITATIONS: {participationrestrictions:25113}  PERSONAL FACTORS {Personal factors:25162} are also affecting patient's functional outcome.   REHAB POTENTIAL: {rehabpotential:25112}  CLINICAL DECISION MAKING: {clinical decision making:25114}  EVALUATION COMPLEXITY: {Evaluation complexity:25115}   GOALS: Goals reviewed with patient? {yes/no:20286}  SHORT TERM GOALS: Target date: {follow up:25551}   *** Baseline: *** Goal status: {GOALSTATUS:25110}  2.   *** Baseline: *** Goal status: {GOALSTATUS:25110}  3.  *** Baseline: *** Goal status: {GOALSTATUS:25110}  4.  *** Baseline: *** Goal status: {GOALSTATUS:25110}  5.  *** Baseline: *** Goal status: {GOALSTATUS:25110}  6.  *** Baseline: *** Goal status: {GOALSTATUS:25110}  LONG TERM GOALS: Target date: {follow up:25551}  *** Baseline: *** Goal status: {GOALSTATUS:25110}  2.  *** Baseline: *** Goal status: {GOALSTATUS:25110}  3.  *** Baseline: *** Goal status: {GOALSTATUS:25110}  4.  *** Baseline: *** Goal status: {GOALSTATUS:25110}  5.  *** Baseline: *** Goal status: {GOALSTATUS:25110}  6.  *** Baseline: *** Goal status: {  GOALSTATUS:25110}   PLAN: PT FREQUENCY: {rehab frequency:25116}  PT DURATION: {rehab duration:25117}  PLANNED INTERVENTIONS: {rehab planned interventions:25118::"Therapeutic exercises","Therapeutic activity","Neuromuscular re-education","Balance training","Gait training","Patient/Family education","Joint mobilization"}  PLAN FOR NEXT SESSION: ***   Gurtej Noyola, PT 10/24/2021, 7:26 PM

## 2021-10-26 ENCOUNTER — Telehealth: Payer: Self-pay | Admitting: Psychiatry

## 2021-10-26 ENCOUNTER — Telehealth: Payer: Self-pay | Admitting: Internal Medicine

## 2021-10-26 ENCOUNTER — Ambulatory Visit: Payer: Medicaid Other | Admitting: Physical Therapy

## 2021-10-26 NOTE — Telephone Encounter (Signed)
Pt asking if can take DULoxetine (CYMBALTA) 20 MG capsule at night instead of in the morning. Would like a call back from the nurse.  Does this medication also suppose to help with the fuzzy?

## 2021-10-26 NOTE — Telephone Encounter (Addendum)
Called patient and with sign interpreter [phone] left message. Advised patient she may take duloxetine at night instead of morning, but to be consistent with the time. Requested she call back to explain her 2nd question.

## 2021-10-29 ENCOUNTER — Ambulatory Visit: Payer: Medicaid Other

## 2021-11-26 ENCOUNTER — Encounter: Payer: Self-pay | Admitting: Internal Medicine

## 2021-11-26 ENCOUNTER — Ambulatory Visit: Payer: Medicaid Other | Attending: Internal Medicine | Admitting: Internal Medicine

## 2021-11-26 VITALS — BP 98/80 | HR 67 | Temp 98.9°F | Ht 68.0 in | Wt 187.6 lb

## 2021-11-26 DIAGNOSIS — Z23 Encounter for immunization: Secondary | ICD-10-CM

## 2021-11-26 DIAGNOSIS — J449 Chronic obstructive pulmonary disease, unspecified: Secondary | ICD-10-CM | POA: Diagnosis not present

## 2021-11-26 DIAGNOSIS — H9313 Tinnitus, bilateral: Secondary | ICD-10-CM | POA: Diagnosis not present

## 2021-11-26 DIAGNOSIS — F17210 Nicotine dependence, cigarettes, uncomplicated: Secondary | ICD-10-CM

## 2021-11-26 DIAGNOSIS — Z1331 Encounter for screening for depression: Secondary | ICD-10-CM

## 2021-11-26 DIAGNOSIS — M778 Other enthesopathies, not elsewhere classified: Secondary | ICD-10-CM

## 2021-11-26 DIAGNOSIS — G629 Polyneuropathy, unspecified: Secondary | ICD-10-CM

## 2021-11-26 DIAGNOSIS — F17200 Nicotine dependence, unspecified, uncomplicated: Secondary | ICD-10-CM

## 2021-11-26 MED ORDER — NAPROXEN 500 MG PO TABS: 500.0000 mg | ORAL_TABLET | Freq: Two times a day (BID) | ORAL | 0 refills | Status: DC | PRN

## 2021-11-26 NOTE — Progress Notes (Signed)
Has pain in left hand Spots on lower legs.

## 2021-11-26 NOTE — Progress Notes (Signed)
Patient ID: Latasha Parker, female    DOB: 1976/05/19  MRN: [id number]  CC: Hand Pain   Subjective: Latasha Parker is a 46 y.o. female who presents for chronic ds management.  Amelia, fom CAP, here to do sign language Her concerns today include:  Hx of COPD, tob dep, anxiety, endometriosis, Congenital deafness   C/o pain LT thumb.  Points to thenar eminence.  Present x 2 mths. Initially it was swollen.  No initiating event. Crampy, achy and hurts when she holds/squeezes things.  Not taking anything for it.   C/o tingling feeling in legs still present  Saw Dr. Billey Gosling, her neurologist, 09/2021.  Plan was to do EMG/NCS but pt reports she was not been contacted as yet for this Placed on Cymbalta for HA and neck pain.  Reports significant improvement  On previous visit she had complained of buzzing in the left ear.  It is now moved to the right.   Saw ENT at [facility].  Reassurance given.  Told no effective treatment.  COPD:  uses Flovent only a few times a wk. Still smokes. 5 cigarettes/day.  Has cut back a lot.  Trying to quit by gradually cutting back.  Has patches at home but has not used them as yet  BP a little low today. No dizziness.  Drinks adequate fluids  Noted to have a positive depression screen today with PHQ-9 score of 10.  Reports that she feels down at times but does not think it is a major issue at this time.  She is on Cymbalta through the neurologist.  She feels she could benefit from some counseling and has information about a counselor who caters to clients who are hearing impaired.  She plans to set up an appointment  HM:  due for Tdapt.  Agreeable to receiving that today.  Patient Active Problem List   Diagnosis Date Noted   Thrombocytosis 08/31/2021   Elevated platelet count 08/10/2021   Migraine without aura and without status migrainosus, not intractable 05/27/2021   Tinnitus of right ear 05/27/2021   Congenital deafness 05/27/2021   Peripheral polyneuropathy  05/27/2021   Tobacco dependence 05/27/2021   Generalized anxiety disorder 05/27/2021   Anxiety 08/14/2018   Post-operative state 08/12/2014   Dermoid cyst 06/19/2014   Health care maintenance 10/09/2013   Right knee pain 10/09/2013   Insomnia 09/09/2013   COPD (chronic obstructive pulmonary disease) with chronic bronchitis ([place]) 09/09/2013   Tobacco abuse 09/09/2013   Endometriosis 11/27/2012   Menorrhagia 01/13/2012   Dysmenorrhea 01/13/2012   Back pain 01/13/2012   Ovarian cyst 01/13/2012     Current Outpatient Medications on File Prior to Visit  Medication Sig Dispense Refill   DULoxetine (CYMBALTA) 20 MG capsule Take 1 capsule (20 mg total) by mouth daily. 30 capsule 3   FLOVENT HFA 44 MCG/ACT inhaler INHALE 2 PUFFS BY MOUTH TWICE A DAY 31.8 Inhaler 3   nicotine (NICODERM CQ - DOSED IN MG/24 HOURS) 21 mg/24hr patch Place 1 patch (21 mg total) onto the skin daily. 28 patch 0   promethazine (PHENERGAN) 12.5 MG tablet 1 tab PO daily PRN for nausea/vomiting 15 tablet 0   SUMAtriptan (IMITREX) 50 MG tablet 1 TAB AT START OF HEADACHE, MAY REPEAT IN 2 HRS IF NO RELIEF. MAX 2TABS/24 HRS 10 tablet 1   No current facility-administered medications on file prior to visit.    Allergies  Allergen Reactions   Tramadol Nausea And Vomiting   Vicodin [Hydrocodone-Acetaminophen] Nausea And  Vomiting    Social History   Socioeconomic History   Marital status: Significant Other    Spouse name: Not on file   Number of children: 2   Years of education: Not on file   Highest education level: High school graduate  Occupational History   Occupation: unemployed  Tobacco Use   Smoking status: Every Day    Packs/day: 0.50    Years: 15.00    Total pack years: 7.50    Types: Cigarettes   Smokeless tobacco: Never   Tobacco comments:    04/10/2019 reports down to 1/2 pack per day  Vaping Use   Vaping Use: Never used  Substance and Sexual Activity   Alcohol use: No   Drug use: No   Sexual  activity: Not Currently    Birth control/protection: None  Other Topics Concern   Not on file  Social History Narrative   Caffeine- coffee 1-2 c, 4-5  Mtn Dew daily   Social Determinants of Health   Financial Resource Strain: Medium Risk (09/28/2021)   Overall Financial Resource Strain (CARDIA)    Difficulty of Paying Living Expenses: Somewhat hard  Food Insecurity: Food Insecurity Present (09/28/2021)   Hunger Vital Sign    Worried About [place] in the Last Year: Sometimes true    Ran Out of Food in the Last Year: Sometimes true  Transportation Needs: No Transportation Needs (09/28/2021)   PRAPARE - Hydrologist (Medical): No    Lack of Transportation (Non-Medical): No  Physical Activity: Not on file  Stress: Not on file  Social Connections: Not on file  Intimate Partner Violence: Not on file    Family History  Problem Relation Age of Onset   Cancer Mother    Cancer Father    Cancer Maternal Grandmother    Breast cancer Maternal Grandmother    Breast cancer Paternal Aunt     Past Surgical History:  Procedure Laterality Date   DIAGNOSTIC LAPAROSCOPY  2003   ovarian cyst removed   FOOT SURGERY     LAPAROSCOPIC ASSISTED VAGINAL HYSTERECTOMY N/A 08/12/2014   Procedure: LAPAROSCOPIC ASSISTED VAGINAL HYSTERECTOMY;  Surgeon: Emily Filbert, MD;  Location: [facility];  Service: Gynecology;  Laterality: N/A;   LAPAROSCOPIC SALPINGO OOPHERECTOMY Right 08/12/2014   Procedure: REMOVE RIGHT SALPINGO OOPHORECTOMY;  Surgeon: Emily Filbert, MD;  Location: [facility];  Service: Gynecology;  Laterality: Right;   LAPAROSCOPY N/A 07/17/2012   Procedure: LAPAROSCOPY OPERATIVE;  Surgeon: Mora Bellman, MD;  Location: [facility];  Service: Gynecology;  Laterality: N/A;  left oopherectomy and salpingectomy   MULTIPLE EXTRACTIONS WITH ALVEOLOPLASTY N/A 06/06/2014   Procedure: MULTIPLE EXTRACTIONS WITH ALVEOLOPLASTY/REMOVAL OF TORI;  Surgeon: Gae Bon, DDS;  Location: [facility];   Service: Oral Surgery;  Laterality: N/A;   ovary tumor     cyst   WISDOM TOOTH EXTRACTION      ROS: Review of Systems Negative except as stated above  PHYSICAL EXAM: BP 98/80   Pulse 67   Temp 98.9 F (37.2 C) (Oral)   Ht '5\' 8"'$  (1.727 m)   Wt 187 lb 9.6 oz (85.1 kg)   LMP 06/12/2014 (Exact Date)   SpO2 93%   BMI 28.52 kg/m   Physical Exam  General appearance - alert, well appearing, and in no distress Mental status -patient answers questions appropriately. Chest - clear to auscultation, no wheezes, rales or rhonchi, symmetric air entry Heart - normal rate, regular rhythm, normal S1,  S2, no murmurs, rubs, clicks or gallops Musculoskeletal -patient has good grip bilaterally but winces a little bit with pain on the left. Left hand: No edema or erythema noted over the thenar eminence.  She has mild tenderness over the thenar eminence and at the base of the thumb.  No discomfort with movement of the thumb. Extremities - peripheral pulses normal, no pedal edema, no clubbing or cyanosis     11/26/2021    1:41 PM 05/27/2021    3:18 PM 08/24/2017    3:56 PM  Depression screen PHQ 2/9  Decreased Interest 1 2 0  Down, Depressed, Hopeless '1 2 1  '$ PHQ - 2 Score '2 4 1  '$ Altered sleeping 2 3 0  Tired, decreased energy '2 3 3  '$ Change in appetite 1 0 0  Feeling bad or failure about yourself  '1 1 1  '$ Trouble concentrating 1 0 0  Moving slowly or fidgety/restless 1 1 0  Suicidal thoughts 0 0 0  PHQ-9 Score '10 12 5       '$ Latest Ref Rng & Units 08/10/2021    1:22 PM 05/27/2021    3:16 PM 11/19/2017   11:23 AM  CMP  Glucose 70 - 99 mg/dL 93  82  85   BUN 6 - 20 mg/dL '13  8  7   '$ Creatinine 0.44 - 1.00 mg/dL 0.77  0.71  0.70   Sodium 135 - 145 mmol/L 139  141  139   Potassium 3.5 - 5.1 mmol/L 3.7  4.5  3.6   Chloride 98 - 111 mmol/L 106  103  106   CO2 22 - 32 mmol/L '26  23  25   '$ Calcium 8.9 - 10.3 mg/dL 9.2  9.5  8.9   Total Protein 6.5 - 8.1 g/dL 7.1  6.6  6.4   Total Bilirubin 0.3 -  1.2 mg/dL 0.3  <0.2  0.3   Alkaline Phos 38 - 126 U/L 113  138  87   AST 15 - 41 U/L '19  28  23   '$ ALT 0 - 44 U/L '17  22  19     '$ CBC    Component Value Date/Time   WBC 7.9 08/31/2021 0943   WBC 9.2 11/19/2017 1123   RBC 4.35 08/31/2021 0943   HGB 13.4 08/31/2021 0943   HGB 14.4 05/27/2021 1516   HCT 39.6 08/31/2021 0943   HCT 43.0 05/27/2021 1516   PLT 490 (H) 08/31/2021 0943   PLT 528 (H) 05/27/2021 1516   MCV 91.0 08/31/2021 0943   MCV 94 05/27/2021 1516   MCH 30.8 08/31/2021 0943   MCHC 33.8 08/31/2021 0943   RDW 14.3 08/31/2021 0943   RDW 13.1 05/27/2021 1516   LYMPHSABS 3.1 08/31/2021 0943   MONOABS 0.7 08/31/2021 0943   EOSABS 0.3 08/31/2021 0943   BASOSABS 0.1 08/31/2021 0943    ASSESSMENT AND PLAN: 1. Tendinitis of thumb Recommend trial of NSAID for at least a week.  If no improvement, we can refer to orthopedics. - naproxen (NAPROSYN) 500 MG tablet; Take 1 tablet (500 mg total) by mouth 2 (two) times daily as needed. Take with food  Dispense: 30 tablet; Refill: 0  2. Tinnitus, bilateral Evaluated by ENT for this 09/2021.  Patient informed no effective treatment available at this time.  3. Tobacco dependence Continue to encourage smoking cessation.  Patient states she plans to quit and will continue trying to cut back gradually.  4. COPD (chronic obstructive pulmonary disease) with  chronic bronchitis ([facility]) Stable.  She uses Flovent inhaler. Advised to quit smoking.  5. Peripheral polyneuropathy Message sent to her neurologist Dr. Billey Gosling letting her know that patient has not received appointment as yet for EMG study  6. Positive depression screening On Cymbalta.  Patient plans to make an appointment with a therapist.  7. Need for Tdap vaccination Given today.    Patient was given the opportunity to ask questions.  Patient verbalized understanding of the plan and was able to repeat key elements of the plan.   This documentation was completed using English as a second language teacher.  Any transcriptional errors are unintentional.  Orders Placed This Encounter  Procedures   Tdap vaccine greater than or equal to 7yo IM     Requested Prescriptions   Signed Prescriptions Disp Refills   naproxen (NAPROSYN) 500 MG tablet 30 tablet 0    Sig: Take 1 tablet (500 mg total) by mouth 2 (two) times daily as needed. Take with food    Return in about 4 months (around 03/29/2022).  Karle Plumber, MD, FACP

## 2021-11-29 ENCOUNTER — Telehealth: Payer: Self-pay | Admitting: Internal Medicine

## 2021-11-29 NOTE — Telephone Encounter (Signed)
-----   Message from Genia Harold, MD sent at 11/29/2021 10:13 AM EDT ----- Sonnie Alamo,  Thanks for reaching out. I can see that a referral for the EMG was sent back in May, but it looks like there's no documentation in the chart beyond that. I'll reach out to my referrals team to check on the status of the EMG so we can make sure it gets done.  Thanks, Charlies Constable ----- Message ----- From: Ladell Pier, MD Sent: 11/26/2021   6:04 PM EDT To: Genia Harold, MD  I am the PCP for this patient.  You saw her in May of this year.  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  You had ordered an EMG.  However patient states she has not received a call to be scheduled for this as yet.  I told her I will touch base with you hoping that someone from your office would give her a call regarding the study.

## 2021-11-30 ENCOUNTER — Telehealth: Payer: Self-pay | Admitting: *Deleted

## 2021-11-30 NOTE — Telephone Encounter (Signed)
Called patient with sign language interpreter [phone]. I advised her she was scheduled for EMG with [facility] on 10/21/21 but no showed. She can call [phone] to try and reschedule. I gave her #, she verbalized understanding, appreciation.

## 2021-12-16 DIAGNOSIS — G608 Other hereditary and idiopathic neuropathies: Secondary | ICD-10-CM | POA: Diagnosis not present

## 2021-12-27 ENCOUNTER — Telehealth: Payer: Self-pay | Admitting: Psychiatry

## 2021-12-27 NOTE — Telephone Encounter (Signed)
Called patient, Sign language Interpreter [phone] spoke with patient.  I informed her Dr Billey Gosling is gone for the day; I called [facility] and just received the NCS results, placed on MD desk. Er:; her headache: she took naproxen 500 mg an hour ago, and headache is better now, less neck pain. She continues taking duloxetine daily, uses sumatriptan as needed. She stated her headaches are better but asked if her duloxetine dose needs to be increased. I advised her that since she reports improvement MD probably would not increase dose. However I will send this message to MD. She has no further questions. I advised will let her know NCS results.. Patient verbalized understanding, appreciation.

## 2021-12-27 NOTE — Telephone Encounter (Addendum)
Pt states since this morning she has had a headache , in one spot. It is on the top of her head, left side.  Pt has also asked it be noted she is waiting on results to the test ran on her legs, (test that Dr Billey Gosling ordered)

## 2021-12-27 NOTE — Telephone Encounter (Signed)
Called [facility], spoke with Nira Conn, requested EMG/ NCS results. She stated she would fax. Received fax, placed on MD desk for review.

## 2021-12-29 ENCOUNTER — Other Ambulatory Visit: Payer: Self-pay | Admitting: Psychiatry

## 2021-12-29 DIAGNOSIS — M21619 Bunion of unspecified foot: Secondary | ICD-10-CM

## 2021-12-29 DIAGNOSIS — G5753 Tarsal tunnel syndrome, bilateral lower limbs: Secondary | ICD-10-CM

## 2021-12-29 NOTE — Telephone Encounter (Signed)
Sure, I put a referral to Podiatry in for her

## 2021-12-29 NOTE — Telephone Encounter (Signed)
Called patient with sign language interpreter [phone]. He relayed MD result in detail with her. Patient reported she has bunions on her feet, sometimes painful, calcifications on toes that are bent which makes it difficult to wear shoes and history of foot surgery 15 years ago. She asked for referral to podiatrist to be placed now. I advised will send her request to Dr Billey Gosling. She has no further questions,  verbalized understanding, appreciation.

## 2021-12-29 NOTE — Telephone Encounter (Signed)
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 The most common cause of this is tarsal tunnel syndrome, where the nerve gets compressed around the ankle (similar to carpal tunnel but in the foot). Using an ice pack and NSAIDs like ibuprofen can help reduce the pain. She can also try a foot brace which can be bought online. If her symptoms don't improve with these measures I can refer her to podiatry for further management.

## 2022-01-03 ENCOUNTER — Other Ambulatory Visit: Payer: Self-pay | Admitting: Internal Medicine

## 2022-01-03 DIAGNOSIS — G43009 Migraine without aura, not intractable, without status migrainosus: Secondary | ICD-10-CM

## 2022-01-03 DIAGNOSIS — M778 Other enthesopathies, not elsewhere classified: Secondary | ICD-10-CM

## 2022-01-04 NOTE — Telephone Encounter (Signed)
Requested medications are due for refill today.  yes  Requested medications are on the active medications list.  yes  Last refill. 05/27/2021 #15 0 refills  Future visit scheduled.   yes  Notes to clinic.  Refill not delegated.    Requested Prescriptions  Pending Prescriptions Disp Refills   promethazine (PHENERGAN) 12.5 MG tablet [Pharmacy Med Name: PROMETHAZINE 12.5 MG TABLET] 15 tablet 0    Sig: TAKE 1 TABLET BY MOUTH EVERY DAY AS NEEDED NAUSEA/VOMITING     Not Delegated - Gastroenterology: Antiemetics Failed - 01/03/2022  5:08 PM      Failed - This refill cannot be delegated      Passed - Valid encounter within last 6 months    Recent Outpatient Visits           1 month ago Tendinitis of thumb   [place], Neoma Laming B, MD   5 months ago Migraine without aura and without status migrainosus, not intractable   [place] Ladell Pier, MD   7 months ago Establishing care with new doctor, encounter for   [place] Ladell Pier, MD       Future Appointments             In 2 months Ladell Pier, MD [place]            Signed Prescriptions Disp Refills   naproxen (NAPROSYN) 500 MG tablet 30 tablet 0    Sig: TAKE 1 TABLET (500 MG TOTAL) BY MOUTH TWICE A DAY WITH FOOD AS NEEDED     Analgesics:  NSAIDS Failed - 01/03/2022  5:08 PM      Failed - Manual Review: Labs are only required if the patient has taken medication for more than 8 weeks.      Failed - PLT in normal range and within 360 days    Platelets  Date Value Ref Range Status  05/27/2021 528 (H) 150 - 450 x10E3/uL Final   Platelet Count  Date Value Ref Range Status  08/31/2021 490 (H) 150 - 400 K/uL Final         Passed - Cr in normal range and within 360 days    Creatinine  Date Value Ref Range Status  08/10/2021 0.77 0.44 - 1.00 mg/dL Final          Passed - HGB in normal range and within 360 days    Hemoglobin  Date Value Ref Range Status  08/31/2021 13.4 12.0 - 15.0 g/dL Final  05/27/2021 14.4 11.1 - 15.9 g/dL Final         Passed - HCT in normal range and within 360 days    HCT  Date Value Ref Range Status  08/31/2021 39.6 36.0 - 46.0 % Final   Hematocrit  Date Value Ref Range Status  05/27/2021 43.0 34.0 - 46.6 % Final         Passed - eGFR is 30 or above and within 360 days    GFR calc Af Amer  Date Value Ref Range Status  11/19/2017 >60 >60 mL/min Final    Comment:    (NOTE) The eGFR has been calculated using the CKD EPI equation. This calculation has not been validated in all clinical situations. eGFR's persistently <60 mL/min signify possible Chronic Kidney Disease.    GFR, Estimated  Date Value Ref Range Status  08/10/2021 >60 >60 mL/min Final  Comment:    (NOTE) Calculated using the CKD-EPI Creatinine Equation (2021)    eGFR  Date Value Ref Range Status  05/27/2021 108 >59 mL/min/1.73 Final         Passed - Patient is not pregnant      Passed - Valid encounter within last 12 months    Recent Outpatient Visits           1 month ago Tendinitis of thumb   [place], MD   5 months ago Migraine without aura and without status migrainosus, not intractable   [place], MD   7 months ago Establishing care with new doctor, encounter for   [place], MD       Future Appointments             In 2 months Wynetta Emery Dalbert Batman, MD [place]

## 2022-01-04 NOTE — Telephone Encounter (Signed)
Requested Prescriptions  Pending Prescriptions Disp Refills  . promethazine (PHENERGAN) 12.5 MG tablet [Pharmacy Med Name: PROMETHAZINE 12.5 MG TABLET] 15 tablet 0    Sig: TAKE 1 TABLET BY MOUTH EVERY DAY AS NEEDED NAUSEA/VOMITING     Not Delegated - Gastroenterology: Antiemetics Failed - 01/03/2022  5:08 PM      Failed - This refill cannot be delegated      Passed - Valid encounter within last 6 months    Recent Outpatient Visits          1 month ago Tendinitis of thumb   [facility] [place], Gavin Pound B, MD   5 months ago Migraine without aura and without status migrainosus, not intractable   [facility] Marcine Matar, MD   7 months ago Establishing care with new doctor, encounter for   [facility] Marcine Matar, MD      Future Appointments            In 2 months Laural [place] Binnie Rail, MD [facility]           . naproxen (NAPROSYN) 500 MG tablet [Pharmacy Med Name: NAPROXEN 500 MG TABLET] 30 tablet 0    Sig: TAKE 1 TABLET (500 MG TOTAL) BY MOUTH TWICE A DAY WITH FOOD AS NEEDED     Analgesics:  NSAIDS Failed - 01/03/2022  5:08 PM      Failed - Manual Review: Labs are only required if the patient has taken medication for more than 8 weeks.      Failed - PLT in normal range and within 360 days    Platelets  Date Value Ref Range Status  05/27/2021 528 (H) 150 - 450 x10E3/uL Final   Platelet Count  Date Value Ref Range Status  08/31/2021 490 (H) 150 - 400 K/uL Final         Passed - Cr in normal range and within 360 days    Creatinine  Date Value Ref Range Status  08/10/2021 0.77 0.44 - 1.00 mg/dL Final         Passed - HGB in normal range and within 360 days    Hemoglobin  Date Value Ref Range Status  08/31/2021 13.4 12.0 - 15.0 g/dL Final  11/06/2815 95.8 11.1 - 15.9 g/dL Final         Passed - HCT in normal range and within 360 days     HCT  Date Value Ref Range Status  08/31/2021 39.6 36.0 - 46.0 % Final   Hematocrit  Date Value Ref Range Status  05/27/2021 43.0 34.0 - 46.6 % Final         Passed - eGFR is 30 or above and within 360 days    GFR calc Af Amer  Date Value Ref Range Status  11/19/2017 >60 >60 mL/min Final    Comment:    (NOTE) The eGFR has been calculated using the CKD EPI equation. This calculation has not been validated in all clinical situations. eGFR's persistently <60 mL/min signify possible Chronic Kidney Disease.    GFR, Estimated  Date Value Ref Range Status  08/10/2021 >60 >60 mL/min Final    Comment:    (NOTE) Calculated using the CKD-EPI Creatinine Equation (2021)    eGFR  Date Value Ref Range Status  05/27/2021 108 >59 mL/min/1.73 Final         Passed - Patient is not pregnant  Passed - Valid encounter within last 12 months    Recent Outpatient Visits          1 month ago Tendinitis of thumb   [place], MD   5 months ago Migraine without aura and without status migrainosus, not intractable   [place], MD   7 months ago Establishing care with new doctor, encounter for   [place], MD      Future Appointments            In 2 months Wynetta Emery Dalbert Batman, MD [place]

## 2022-01-25 ENCOUNTER — Ambulatory Visit (INDEPENDENT_AMBULATORY_CARE_PROVIDER_SITE_OTHER): Payer: Medicaid Other | Admitting: Podiatry

## 2022-01-25 ENCOUNTER — Encounter: Payer: Self-pay | Admitting: Podiatry

## 2022-01-25 ENCOUNTER — Ambulatory Visit (INDEPENDENT_AMBULATORY_CARE_PROVIDER_SITE_OTHER): Payer: Medicaid Other

## 2022-01-25 DIAGNOSIS — Q6650 Congenital pes planus, unspecified foot: Secondary | ICD-10-CM | POA: Diagnosis not present

## 2022-01-25 DIAGNOSIS — G5753 Tarsal tunnel syndrome, bilateral lower limbs: Secondary | ICD-10-CM | POA: Diagnosis not present

## 2022-01-25 NOTE — Progress Notes (Signed)
  Subjective:  Patient ID: Latasha Parker, female    DOB: 1975/11/13,   MRN: [id number]  No chief complaint on file.   46 y.o. female presents for concern of tarsal tunnel syndrome on bilateral feet. Relates has been seen by neurology and PCP and has had EMG/NCV that showed bilateral tarsal tunnel syndrome.  Relates pain on the bottom of her feet and relates it feels like cramping mostly at night. Almost has like a electrical feeling in feet.  . Denies any other pedal complaints. Denies n/v/f/c.   Past Medical History:  Diagnosis Date   Anxiety    Arthritis    Blood transfusion without reported diagnosis 2015   COPD (chronic obstructive pulmonary disease) ([place]) 2015   Cyst of ovary    right   Deaf    Depression    Female bladder prolapse 2019   Hyperthyroidism    Menorrhagia    Neuromuscular disorder ([place]) 2009   Pelvic pain    SVD (spontaneous vaginal delivery)     Objective:  Physical Exam: Vascular: DP/PT pulses 2/4 bilateral. CFT <3 seconds. Normal hair growth on digits. No edema.  Skin. No lacerations or abrasions bilateral feet.  Musculoskeletal: MMT 5/5 bilateral lower extremities in DF, PF, Inversion and Eversion. Deceased ROM in DF of ankle joint.  Tender to PT tendon and along medial ankle. Positive tinels sign noted. Pes planus noted bilateral. 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  Neurological: Sensation intact to light touch.   EMG/NCV   Abnormal study.    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    No electrodiagnostic evidence of a left common peroneal motor or sural sensory peripheral neuropathy in the left lower extremity.    No electrodiagnostic evidence of a common peroneal motor entrapment neuropathy at the level of the left fibular head.    Electrodiagnostic evidence of a right tibial motor axonal peripheral neuropathy in the  right lower extremity.    No electrodiagnostic evidence of a right common peroneal motor or sural sensory peripheral neuropathy in the right lower extremity.    No electrodiagnostic evidence of a common peroneal motor entrapment neuropathy at the level of the right fibular head.    Assessment:   1. 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 -Xrays reviewed. No acute fractures or dislocations. There is collapse of the arch bilateral more so on the right. Retained hardware from previous bunion surgery with reoccurrence on right  and valgus deformity of lesser digits at MPJ. Right foot with severe HAV deformity as well.  -Reviewed noted from PCP and neurologist.  -Reviewed EMG/ NCV with patient showing tibial neuropathy likely due to tarsal tunnel syndrome.  -Discussed treatement options; discussed tarsal tunnel and pes planus. deformity;conservative and  surgical  -Recommend good supportive shoes -Recommend daily stretching and icing -Recommend anti-inflammatories as needed.  -Discussed CMO and how this will help with arch and reduce pressure on tarsal tunnel area. RX for [employer] provided.  -Patient deferred injection today.  -Discussed surgical options to relief the pain in this area including decompression. Would recommend MRI prior to determine source of tibial entrapment.  -Patient to return to office as needed or sooner if condition worsens.   Lorenda Peck, DPM

## 2022-01-25 NOTE — Patient Instructions (Signed)
Tarsal Tunnel Syndrome Rehab Ask your health care provider which exercises are safe for you. Do exercises exactly as told by your health care provider and adjust them as directed. It is normal to feel mild stretching, pulling, tightness, or discomfort as you do these exercises. Stop right away if you feel sudden pain or your pain gets worse. Do not begin these exercises until told by your health care provider. Stretching and range-of-motion exercises These exercises warm up your muscles and joints and improve the movement and flexibility of your foot. These exercises also help to relieve pain, numbness, and tingling. Gastrocnemius stretch, standing This exercise is also called a calf stretch. It stretches the muscles in the back of the lower leg (gastrocnemius). Stand with your hands against a wall. Extend your left / right leg behind you, and bend your front knee slightly. Your heels should be on the floor. Keeping your heels on the floor and your back knee straight, shift your weight toward the wall. Do not arch your back. You should feel a gentle stretch in the back of your lower leg (calf). Hold this position for __________ seconds. Return to the starting position. Repeat __________ times. Complete this exercise __________ times a day. Tibial nerve glide Sit on a stable chair with both feet on the floor. Clasp your hands together behind your back. Gently round your back and tuck your chin toward your chest. Slowly straighten your knee as far as you can without increasing your symptoms. Turn your left / right foot so that your toes are pointing outward. Slowly tip your toes toward your shin. Hold this position for __________ seconds. Slowly return to the starting position. Repeat __________ times. Complete this exercise __________ times a day. Strengthening exercises These exercises build strength and endurance in your foot. Endurance is the ability to use your muscles for a long time, even  after they get tired. Plantar flexion with band  Sit on the floor with your left / right leg extended. Loop a rubber exercise band or tube around the ball of your left / right foot. The ball of your foot is on the walking surface, right under your toes. The band or tube should be slightly tense when your foot is relaxed. Hold the two ends of the band or tube in your hands. Slowly point your toes downward, pushing them away from you (plantar flexion). Stop pushing your toes down if you have any pain. Hold this position for __________ seconds. Let the band or tube slowly pull your foot back to the starting position. Repeat __________ times. Complete this exercise __________ times a day. Ankle inversion with band Secure one end of an exercise band or tubing to a fixed object, such as a table leg or a pole, that will stay still when the band is pulled. Secure the other end of the band around your left / right foot, near your toes. Sit on the floor, facing the fixed object. The band should be slightly tense when your foot is relaxed. Make fists with your hands and put them between your knees. This will focus your strengthening at your ankle. Leading with your big toe, slowly pull your banded foot inward, toward your body (inversion). The band or tube should be adding resistance. Hold this position for __________ seconds. Let the band or tube slowly pull your foot back to the starting position. Repeat __________ times. Complete this exercise __________ times a day. Arch lifts This exercise strengthens the main muscles of your  foot (foot intrinsics). Sit in a chair with your feet flat on the floor. Keeping your big toe and your heel on the floor, lift only your arch, which is on the inner edge of your left / right foot. Do not move your knee or scrunch your toes. This is a small movement. Hold this position for __________ seconds. Slowly return to the starting position. Repeat __________ times.  Complete this exercise __________ times a day. This information is not intended to replace advice given to you by your health care provider. Make sure you discuss any questions you have with your health care provider. Document Revised: 08/27/2020 Document Reviewed: 08/27/2020 Elsevier Patient Education  [place].

## 2022-01-30 ENCOUNTER — Other Ambulatory Visit: Payer: Self-pay | Admitting: Psychiatry

## 2022-02-04 ENCOUNTER — Telehealth: Payer: Self-pay | Admitting: Psychiatry

## 2022-02-04 NOTE — Telephone Encounter (Signed)
error 

## 2022-02-14 ENCOUNTER — Telehealth: Payer: Self-pay | Admitting: Psychiatry

## 2022-02-14 NOTE — Telephone Encounter (Signed)
Called pt back to discuss. She did not know she has an appt with Dr. Billey Gosling 9/27 at 3:30 PM. I advised pt to arrive 30 minutes before her appt. She understood and appreciated the call back and will discuss symptoms during appt.

## 2022-02-14 NOTE — Telephone Encounter (Signed)
Pt called stating that she is having Neck pain now on the L side, headaches occasionally but mainly neck pain and she would like to be advised.

## 2022-02-16 ENCOUNTER — Ambulatory Visit (INDEPENDENT_AMBULATORY_CARE_PROVIDER_SITE_OTHER): Payer: Medicaid Other | Admitting: Psychiatry

## 2022-02-16 VITALS — BP 114/72 | HR 65 | Ht 68.0 in | Wt 182.2 lb

## 2022-02-16 DIAGNOSIS — M5481 Occipital neuralgia: Secondary | ICD-10-CM | POA: Diagnosis not present

## 2022-02-16 DIAGNOSIS — G43009 Migraine without aura, not intractable, without status migrainosus: Secondary | ICD-10-CM

## 2022-02-16 MED ORDER — SUMATRIPTAN SUCCINATE 100 MG PO TABS
100.0000 mg | ORAL_TABLET | ORAL | 6 refills | Status: DC | PRN
Start: 2022-02-16 — End: 2023-10-26

## 2022-02-16 MED ORDER — DULOXETINE HCL 20 MG PO CPEP: 40.0000 mg | ORAL_CAPSULE | Freq: Every day | ORAL | 6 refills | Status: DC

## 2022-02-16 NOTE — Progress Notes (Signed)
   CC:  headaches  Follow-up Visit  Last visit: 09/27/21  Brief HPI: 46 year old female with a history of COPD, anxiety, congenital deafness who follows in clinic for migraines and left occipital neuralgia. 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  At her last visit she was started on Cymbalta for headache prevention and referred to neck PT. EMG was to assess burning pain in her feet.  Interval History: Headaches and neck pain improved initially with Cymbalta. She has tolerated Cymbalta well without side effects. The pain started to return about 4 days ago. She has started to have tingling on the left side of her head again. Had a severe headache last night and took Imitrex for rescue, but it was not effective. It does not cause side effects. She has not yet scheduled neck PT.  EMG was suggestive of bilateral tarsal tunnel syndrome and she was referred to podiatry. She is working on getting inserts for her shoes.  Headache days per month: 4 Headache free days per month: 26  Current Headache Regimen: Preventative: Cymbalta 20 mg daily Abortive: Imitrex 50 mg PRN  Prior Therapies                                  Imitrex 50 mg PRN Topamax 25 mg QHS - tingling Cymbalta 20 mg daily Aleve  Physical Exam:   Vital Signs: LMP 06/12/2014 (Exact Date)  GENERAL:  well appearing, in no acute distress, alert  SKIN:  Color, texture, turgor normal. No rashes or lesions HEAD:  Normocephalic/atraumatic. RESP: normal respiratory effort MSK:  +tenderness to palpation over bilateral occipital notch, limited cervical ROM turning head left>right  NEUROLOGICAL: Mental Status: Alert, oriented to person, place and time, Follows commands, and Speech fluent and appropriate. Cranial Nerves: PERRL, face symmetric, no dysarthria, deaf Motor: moves all extremities equally Gait: normal-based.  IMPRESSION: 46 year old female with a history of COPD, anxiety,  congenital deafness who presents for follow up of migraines and occipital neuralgia. Pain had initially improved with Cymbalta, but has started to return in the past few days. Will increase Cymbalta to 40 mg daily and increase Imitrex to 100 mg PRN. Encouraged her to schedule neck PT to help with her occipital neuralgia and cervicalgia.  PLAN: -Prevention: Increase Cymbalta to 40 mg daily -Rescue: Increase Imitrex to 100 mg PRN -Encouraged patient to schedule neck PT   Follow-up: 6 months  I spent a total of 23 minutes on the date of the service. Headache education was done. Discussed treatment options including preventive and acute medications, and physical therapy. Discussed medication side effects, adverse reactions and drug interactions. Written educational materials and patient instructions outlining all of the above were given.  Genia Harold, MD 02/16/22 4:01 PM

## 2022-02-16 NOTE — Patient Instructions (Signed)
Increase sumatriptan to 100 mg as needed for migraine headaches  Increase Cymbalta to 40 mg (2 pills) daily for headaches and neck pain  Physical therapy for the neck

## 2022-03-29 ENCOUNTER — Encounter: Payer: Self-pay | Admitting: Internal Medicine

## 2022-03-29 ENCOUNTER — Ambulatory Visit: Payer: Medicaid Other | Attending: Internal Medicine | Admitting: Internal Medicine

## 2022-03-29 VITALS — BP 108/71 | HR 67 | Temp 99.1°F | Ht 68.0 in | Wt 186.0 lb

## 2022-03-29 DIAGNOSIS — R109 Unspecified abdominal pain: Secondary | ICD-10-CM | POA: Diagnosis not present

## 2022-03-29 DIAGNOSIS — H9313 Tinnitus, bilateral: Secondary | ICD-10-CM

## 2022-03-29 DIAGNOSIS — G8929 Other chronic pain: Secondary | ICD-10-CM

## 2022-03-29 DIAGNOSIS — Z23 Encounter for immunization: Secondary | ICD-10-CM

## 2022-03-29 DIAGNOSIS — G5753 Tarsal tunnel syndrome, bilateral lower limbs: Secondary | ICD-10-CM

## 2022-03-29 DIAGNOSIS — K5909 Other constipation: Secondary | ICD-10-CM | POA: Diagnosis not present

## 2022-03-29 DIAGNOSIS — Z1231 Encounter for screening mammogram for malignant neoplasm of breast: Secondary | ICD-10-CM

## 2022-03-29 NOTE — Progress Notes (Signed)
Patient ID: Latasha Parker, female    DOB: 1975/09/25  MRN: [id number]  CC: Tinnitus (F/u./Pain on L side of abdomen X 3-4 yrs. Still experiencing ringing in ears but improving. /Intermittent nerve pain in legs./Yes to flu vax. (Received last one in Jan 2023))   Subjective: Latasha Parker is a 46 y.o. female who presents for chronic ds managment Her concerns today include:  Hx of COPD, tob dep, anxiety, endometriosis, Congenital deafness   Since last visit with me, she did see her neurologist and had EMG of the lower extremities.  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  The podiatrist recommended good support shoes, daily stretching and icing and use of anti-inflammatory.  Prescription was given for an arch support.  Patient took it to [facility] but they do not except Medicaid.  She still gets ringing in the ears but this is getting better.  Complains of intermittent mid left abdominal pain that she has had for the past 2 to 3 years.  Occurs when she is busy doing a lot of activities like cleaning the house.  Not associated with food.  No blood in the stools.  However recently she has had some constipation because she decided to stop eating nuts.  She felt the nuts were contributing to headaches.  She purchased some MiraLAX over-the-counter yesterday day and plans to use as needed. Episodes have not increased in frequency over the past 2 to 3 years.  No hematuria.   HM: Due for flu shot.  She is requesting referral for mammogram.  Last mammogram was in 2019.  Patient Active Problem List   Diagnosis Date Noted   Thrombocytosis 08/31/2021   Elevated platelet count 08/10/2021   Migraine without aura and without status migrainosus, not intractable 05/27/2021   Tinnitus of right ear 05/27/2021   Congenital deafness 05/27/2021   Peripheral polyneuropathy 05/27/2021   Tobacco dependence 05/27/2021   Generalized anxiety  disorder 05/27/2021   Anxiety 08/14/2018   Post-operative state 08/12/2014   Dermoid cyst 06/19/2014   Health care maintenance 10/09/2013   Right knee pain 10/09/2013   Insomnia 09/09/2013   COPD (chronic obstructive pulmonary disease) with chronic bronchitis 09/09/2013   Tobacco abuse 09/09/2013   Endometriosis 11/27/2012   Menorrhagia 01/13/2012   Dysmenorrhea 01/13/2012   Back pain 01/13/2012   Ovarian cyst 01/13/2012     Current Outpatient Medications on File Prior to Visit  Medication Sig Dispense Refill   DULoxetine (CYMBALTA) 20 MG capsule Take 2 capsules (40 mg total) by mouth daily. 60 capsule 6   naproxen (NAPROSYN) 500 MG tablet TAKE 1 TABLET (500 MG TOTAL) BY MOUTH TWICE A DAY WITH FOOD AS NEEDED 30 tablet 0   nicotine (NICODERM CQ - DOSED IN MG/24 HOURS) 21 mg/24hr patch Place 1 patch (21 mg total) onto the skin daily. 28 patch 0   promethazine (PHENERGAN) 12.5 MG tablet TAKE 1 TABLET BY MOUTH EVERY DAY AS NEEDED NAUSEA/VOMITING 15 tablet 0   SUMAtriptan (IMITREX) 100 MG tablet Take 1 tablet (100 mg total) by mouth every 2 (two) hours as needed for migraine. May repeat in 2 hours if headache persists or recurs. 10 tablet 6   FLOVENT HFA 44 MCG/ACT inhaler INHALE 2 PUFFS BY MOUTH TWICE A DAY (Patient not taking: Reported on 02/16/2022) 31.8 Inhaler 3   No current facility-administered medications on file prior to visit.    Allergies  Allergen Reactions  Tramadol Nausea And Vomiting   Vicodin [Hydrocodone-Acetaminophen] Nausea And Vomiting    Social History   Socioeconomic History   Marital status: Significant Other    Spouse name: Not on file   Number of children: 2   Years of education: Not on file   Highest education level: High school graduate  Occupational History   Occupation: unemployed  Tobacco Use   Smoking status: Every Day    Packs/day: 0.50    Years: 15.00    Total pack years: 7.50    Types: Cigarettes   Smokeless tobacco: Never   Tobacco  comments:    04/10/2019 reports down to 1/2 pack per day  Vaping Use   Vaping Use: Never used  Substance and Sexual Activity   Alcohol use: No   Drug use: No   Sexual activity: Not Currently    Birth control/protection: None  Other Topics Concern   Not on file  Social History Narrative   Caffeine- coffee 1-2 c, 4-5  Mtn Dew daily   Social Determinants of Health   Financial Resource Strain: Medium Risk (09/28/2021)   Overall Financial Resource Strain (CARDIA)    Difficulty of Paying Living Expenses: Somewhat hard  Food Insecurity: Food Insecurity Present (09/28/2021)   Hunger Vital Sign    Worried About [place] in the Last Year: Sometimes true    Ran Out of Food in the Last Year: Sometimes true  Transportation Needs: No Transportation Needs (09/28/2021)   PRAPARE - Hydrologist (Medical): No    Lack of Transportation (Non-Medical): No  Physical Activity: Not on file  Stress: Not on file  Social Connections: Not on file  Intimate Partner Violence: Not on file    Family History  Problem Relation Age of Onset   Cancer Mother    Cancer Father    Cancer Maternal Grandmother    Breast cancer Maternal Grandmother    Breast cancer Paternal Aunt     Past Surgical History:  Procedure Laterality Date   DIAGNOSTIC LAPAROSCOPY  2003   ovarian cyst removed   FOOT SURGERY     LAPAROSCOPIC ASSISTED VAGINAL HYSTERECTOMY N/A 08/12/2014   Procedure: LAPAROSCOPIC ASSISTED VAGINAL HYSTERECTOMY;  Surgeon: Emily Filbert, MD;  Location: [facility];  Service: Gynecology;  Laterality: N/A;   LAPAROSCOPIC SALPINGO OOPHERECTOMY Right 08/12/2014   Procedure: REMOVE RIGHT SALPINGO OOPHORECTOMY;  Surgeon: Emily Filbert, MD;  Location: [facility];  Service: Gynecology;  Laterality: Right;   LAPAROSCOPY N/A 07/17/2012   Procedure: LAPAROSCOPY OPERATIVE;  Surgeon: Mora Bellman, MD;  Location: [facility];  Service: Gynecology;  Laterality: N/A;  left oopherectomy and salpingectomy    MULTIPLE EXTRACTIONS WITH ALVEOLOPLASTY N/A 06/06/2014   Procedure: MULTIPLE EXTRACTIONS WITH ALVEOLOPLASTY/REMOVAL OF TORI;  Surgeon: Gae Bon, DDS;  Location: [facility];  Service: Oral Surgery;  Laterality: N/A;   ovary tumor     cyst   WISDOM TOOTH EXTRACTION      ROS: Review of Systems Negative except as stated above  PHYSICAL EXAM: BP 108/71 (BP Location: Left Arm, Patient Position: Sitting, Cuff Size: Normal)   Pulse 67   Temp 99.1 F (37.3 C) (Oral)   Ht '5\' 8"'$  (1.727 m)   Wt 186 lb (84.4 kg)   LMP 06/12/2014 (Exact Date)   SpO2 94%   BMI 28.28 kg/m   Physical Exam  General appearance - alert, well appearing, and in no distress Chest - clear to auscultation, no wheezes, rales or  rhonchi, symmetric air entry Heart - normal rate, regular rhythm, normal S1, S2, no murmurs, rubs, clicks or gallops Abdomen - soft, nontender, nondistended, no masses or organomegaly      Latest Ref Rng & Units 08/10/2021    1:22 PM 05/27/2021    3:16 PM 11/19/2017   11:23 AM  CMP  Glucose 70 - 99 mg/dL 93  82  85   BUN 6 - 20 mg/dL '13  8  7   '$ Creatinine 0.44 - 1.00 mg/dL 0.77  0.71  0.70   Sodium 135 - 145 mmol/L 139  141  139   Potassium 3.5 - 5.1 mmol/L 3.7  4.5  3.6   Chloride 98 - 111 mmol/L 106  103  106   CO2 22 - 32 mmol/L '26  23  25   '$ Calcium 8.9 - 10.3 mg/dL 9.2  9.5  8.9   Total Protein 6.5 - 8.1 g/dL 7.1  6.6  6.4   Total Bilirubin 0.3 - 1.2 mg/dL 0.3  <0.2  0.3   Alkaline Phos 38 - 126 U/L 113  138  87   AST 15 - 41 U/L '19  28  23   '$ ALT 0 - 44 U/L '17  22  19    '$ Lipid Panel  No results found for: "CHOL", "TRIG", "HDL", "CHOLHDL", "VLDL", "LDLCALC", "LDLDIRECT"  CBC    Component Value Date/Time   WBC 7.9 08/31/2021 0943   WBC 9.2 11/19/2017 1123   RBC 4.35 08/31/2021 0943   HGB 13.4 08/31/2021 0943   HGB 14.4 05/27/2021 1516   HCT 39.6 08/31/2021 0943   HCT 43.0 05/27/2021 1516   PLT 490 (H) 08/31/2021 0943   PLT 528 (H) 05/27/2021 1516   MCV 91.0 08/31/2021 0943    MCV 94 05/27/2021 1516   MCH 30.8 08/31/2021 0943   MCHC 33.8 08/31/2021 0943   RDW 14.3 08/31/2021 0943   RDW 13.1 05/27/2021 1516   LYMPHSABS 3.1 08/31/2021 0943   MONOABS 0.7 08/31/2021 0943   EOSABS 0.3 08/31/2021 0943   BASOSABS 0.1 08/31/2021 0943    ASSESSMENT AND PLAN:  1. 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  2. Chronic abdominal pain Seems more musculoskeletal in nature.  I advised that we observe for now.  If it increases in frequency or intensity, she will let me know.  Advised use of Tylenol as needed  3. Tinnitus, bilateral Patient reports this is improving.  4. Other constipation Discussed natural ways to help decrease constipation including drinking adequate water during the day, eating fruits and vegetables.  She can also try eating a few prunes a few times a week.  5. Encounter for screening mammogram for malignant neoplasm of breast - MM Digital Screening; Future  6. Need for immunization against influenza - Flu Vaccine QUAD 79moIM (Fluarix, Fluzone & Alfiuria Quad PF)    Patient was given the opportunity to ask questions.  Patient verbalized understanding of the plan and was able to repeat key elements of the plan.   This documentation was completed using DRadio producer  Any transcriptional errors are unintentional.  Orders Placed This Encounter  Procedures   MM Digital Screening   Flu Vaccine QUAD 613moM (Fluarix, Fluzone & Alfiuria Quad PF)     Requested Prescriptions    No prescriptions requested or ordered in this encounter  Return in about 4 months (around 07/28/2022).  Karle Plumber, MD, FACP

## 2022-04-05 ENCOUNTER — Other Ambulatory Visit: Payer: Self-pay | Admitting: Internal Medicine

## 2022-04-05 DIAGNOSIS — H9313 Tinnitus, bilateral: Secondary | ICD-10-CM

## 2022-04-05 DIAGNOSIS — K5909 Other constipation: Secondary | ICD-10-CM

## 2022-04-05 DIAGNOSIS — Z1231 Encounter for screening mammogram for malignant neoplasm of breast: Secondary | ICD-10-CM

## 2022-04-05 DIAGNOSIS — Z23 Encounter for immunization: Secondary | ICD-10-CM

## 2022-04-05 DIAGNOSIS — G5753 Tarsal tunnel syndrome, bilateral lower limbs: Secondary | ICD-10-CM

## 2022-04-05 DIAGNOSIS — G8929 Other chronic pain: Secondary | ICD-10-CM

## 2022-04-19 ENCOUNTER — Ambulatory Visit: Payer: Medicaid Other

## 2022-04-19 ENCOUNTER — Ambulatory Visit
Admission: RE | Admit: 2022-04-19 | Discharge: 2022-04-19 | Disposition: A | Discharge status: Home / Self Care | Payer: Medicaid Other | Source: Ambulatory Visit | Attending: Internal Medicine | Admitting: Internal Medicine

## 2022-04-19 DIAGNOSIS — Z1231 Encounter for screening mammogram for malignant neoplasm of breast: Secondary | ICD-10-CM | POA: Diagnosis not present

## 2022-04-25 DIAGNOSIS — F4310 Post-traumatic stress disorder, unspecified: Secondary | ICD-10-CM | POA: Diagnosis not present

## 2022-06-30 DIAGNOSIS — F4310 Post-traumatic stress disorder, unspecified: Secondary | ICD-10-CM | POA: Diagnosis not present

## 2022-07-14 DIAGNOSIS — F4310 Post-traumatic stress disorder, unspecified: Secondary | ICD-10-CM | POA: Diagnosis not present

## 2022-07-28 DIAGNOSIS — F4310 Post-traumatic stress disorder, unspecified: Secondary | ICD-10-CM | POA: Diagnosis not present

## 2022-07-29 ENCOUNTER — Ambulatory Visit: Payer: Medicaid Other | Admitting: Internal Medicine

## 2022-08-18 DIAGNOSIS — F4310 Post-traumatic stress disorder, unspecified: Secondary | ICD-10-CM | POA: Diagnosis not present

## 2022-08-25 DIAGNOSIS — F4310 Post-traumatic stress disorder, unspecified: Secondary | ICD-10-CM | POA: Diagnosis not present

## 2022-08-30 ENCOUNTER — Telehealth: Payer: Self-pay | Admitting: Internal Medicine

## 2022-08-30 NOTE — Telephone Encounter (Signed)
Copied from CRM [id number]. Topic: General - Other >> Aug 30, 2022  3:20 PM Yolanda T wrote: Reason for CRM: Patient is requesting a callback on her video phone#[phone] to speak with someone about her restless leg and the nerves in her head and legs

## 2022-08-31 NOTE — Telephone Encounter (Signed)
Called LVM to call back

## 2022-09-14 ENCOUNTER — Ambulatory Visit: Payer: Medicaid Other | Admitting: Psychiatry

## 2022-09-16 ENCOUNTER — Ambulatory Visit: Payer: Self-pay

## 2022-09-16 NOTE — Telephone Encounter (Signed)
Called patient. Set up an appt for Saturday 09/17/2022.  Patient aware of appt.   [employer] interpreter  [phone] assisted with the call.

## 2022-09-16 NOTE — Telephone Encounter (Signed)
  Chief Complaint: Neurological  - pulsating feeling. Thumb and leg restlessness Symptoms: Pt states that she has a pulsating feeling on her head, as well as on her leg. PT is also having trouble with her thumb.  Frequency: 1 year Pertinent Negatives: Patient denies  Disposition: [] ED /[] Urgent Care (no appt availability in office) / [] Appointment(In office/virtual)/ []  [facility]/ [] Home Care/ [] Refused Recommended Disposition /[] [facility]/ [x]  Follow-up with PCP Additional Notes: Pt originally called regarding this on 4/9 and her call was returned by office- [facility] 08/31/2022. No further follow up at that time. Pt states that this nerve issue has been going on for 1 year. Her therapist thinks this may be restless syndrome.  Pt has an appt in July, but would like to be seen sooner.  Please advise.    Summary: pulsating feeling along scalp switches between the left and right sides. As well as on her left leg.   Pt stated she is experiencing a pulsating feeling on head along scalp switches between the left and right sides. As well as on her left leg. Mentioned possible restless syndrome. Mentioned an issue with the left-hand thumb sensitive to picking up stuff.  Please return pt call with Sign Language Interpreter.  Seeking clinical advice.     Answer Assessment - Initial Assessment Questions 1. SYMPTOM: "What is the main symptom you are concerned about?" (e.g., weakness, numbness)     Thumb - pulsating on scalp, and leg. 2. ONSET: "When did this start?" (minutes, hours, days; while sleeping)     1 year 3. LAST NORMAL: "When was the last time you (the patient) were normal (no symptoms)?"      4. PATTERN "Does this come and go, or has it been constant since it started?"  "Is it present now?"      5. CARDIAC SYMPTOMS: "Have you had any of the following symptoms: chest pain, difficulty breathing, palpitations?"      6. NEUROLOGIC SYMPTOMS: "Have you had any of the  following symptoms: headache, dizziness, vision loss, double vision, changes in speech, unsteady on your feet?"      7. OTHER SYMPTOMS: "Do you have any other symptoms?"      8. PREGNANCY: "Is there any chance you are pregnant?" "When was your last menstrual period?"  Protocols used: Neurologic Deficit-A-AH

## 2022-09-17 ENCOUNTER — Ambulatory Visit: Payer: Medicaid Other | Attending: Family | Admitting: Family

## 2022-09-17 ENCOUNTER — Encounter: Payer: Self-pay | Admitting: Family

## 2022-09-17 VITALS — BP 117/81 | HR 67 | Resp 16 | Wt 175.8 lb

## 2022-09-17 DIAGNOSIS — G43009 Migraine without aura, not intractable, without status migrainosus: Secondary | ICD-10-CM | POA: Diagnosis not present

## 2022-09-17 DIAGNOSIS — F17200 Nicotine dependence, unspecified, uncomplicated: Secondary | ICD-10-CM | POA: Diagnosis not present

## 2022-09-17 DIAGNOSIS — R202 Paresthesia of skin: Secondary | ICD-10-CM | POA: Diagnosis not present

## 2022-09-17 NOTE — Patient Instructions (Addendum)
1) Labs today 2) Follow up with your neurologist as scheduled 3) Take Cymbalta as ordered

## 2022-09-17 NOTE — Progress Notes (Signed)
Patient has been counseled on age-appropriate routine health concerns for screening and prevention. These are reviewed and up-to-date. Referrals have been placed accordingly. Immunizations are up-to-date or declined.    Subjective:  Patient is a 47 year old female who is here today with complaints of tingling to her feet and left shoulder.  Has history of congenital deafness, uses sign language and this visit is facilitated by Olegario Messier who is helping with the sign language. Patient says she was previously evaluated by neurology for her tingling and was prescribed Cymbalta which is helping and has upcoming appointment with the neurologist sometimes this summer.  She takes Imitrex as needed but reports she has not needed the medication for long time as her headaches are well-controlled since using Cymbalta. She is wondering if she has restless leg syndrome, denies periodic limb movements at night or snoring. Tingling to her lower extremities and left shoulder are mainly present when she is not moving/ambulating.  She denies pain to her lower extremities or shoulders.  Symptoms do not prevent her from sleeping at night.  Pain is 0 out of 10 on a scale of 0-10 this morning. Patient smokes about 1/2 pack of cigarettes per day.    Review of Systems  Constitutional: Negative.   HENT: Negative.    Eyes: Negative.   Respiratory: Negative.    Cardiovascular: Negative.   Gastrointestinal: Negative.   Genitourinary: Negative.   Neurological:  Positive for tingling.       Intermittent tingling to both feet and left shoulder.  Patient has deafness.   Psychiatric/Behavioral: Negative.      Past Medical History:  Diagnosis Date   Anxiety    Arthritis    Blood transfusion without reported diagnosis 2015   COPD (chronic obstructive pulmonary disease) (HCC) 2015   Cyst of ovary    right   Deaf    Depression    Female bladder prolapse 2019   Hyperthyroidism    Menorrhagia    Neuromuscular disorder  (HCC) 2009   Pelvic pain    SVD (spontaneous vaginal delivery)     Past Surgical History:  Procedure Laterality Date   DIAGNOSTIC LAPAROSCOPY  2003   ovarian cyst removed   FOOT SURGERY     LAPAROSCOPIC ASSISTED VAGINAL HYSTERECTOMY N/A 08/12/2014   Procedure: LAPAROSCOPIC ASSISTED VAGINAL HYSTERECTOMY;  Surgeon: Allie Bossier, MD;  Location: [facility];  Service: Gynecology;  Laterality: N/A;   LAPAROSCOPIC SALPINGO OOPHERECTOMY Right 08/12/2014   Procedure: REMOVE RIGHT SALPINGO OOPHORECTOMY;  Surgeon: Allie Bossier, MD;  Location: [facility];  Service: Gynecology;  Laterality: Right;   LAPAROSCOPY N/A 07/17/2012   Procedure: LAPAROSCOPY OPERATIVE;  Surgeon: Catalina Antigua, MD;  Location: [facility];  Service: Gynecology;  Laterality: N/A;  left oopherectomy and salpingectomy   MULTIPLE EXTRACTIONS WITH ALVEOLOPLASTY N/A 06/06/2014   Procedure: MULTIPLE EXTRACTIONS WITH ALVEOLOPLASTY/REMOVAL OF TORI;  Surgeon: Georgia Lopes, DDS;  Location: [facility];  Service: Oral Surgery;  Laterality: N/A;   ovary tumor     cyst   WISDOM TOOTH EXTRACTION      Family History  Problem Relation Age of Onset   Cancer Mother    Cancer Father    Cancer Maternal Grandmother    Breast cancer Maternal Grandmother    Breast cancer Paternal Aunt     Social History Reviewed with no changes to be made today.   Outpatient Medications Prior to Visit  Medication Sig Dispense Refill   DULoxetine (CYMBALTA) 20 MG capsule Take 2 capsules (40  mg total) by mouth daily. 60 capsule 6   FLOVENT HFA 44 MCG/ACT inhaler INHALE 2 PUFFS BY MOUTH TWICE A DAY (Patient not taking: Reported on 02/16/2022) 31.8 Inhaler 3   naproxen (NAPROSYN) 500 MG tablet TAKE 1 TABLET (500 MG TOTAL) BY MOUTH TWICE A DAY WITH FOOD AS NEEDED 30 tablet 0   nicotine (NICODERM CQ - DOSED IN MG/24 HOURS) 21 mg/24hr patch Place 1 patch (21 mg total) onto the skin daily. 28 patch 0   promethazine (PHENERGAN) 12.5 MG tablet TAKE 1 TABLET BY MOUTH EVERY DAY AS NEEDED  NAUSEA/VOMITING 15 tablet 0   SUMAtriptan (IMITREX) 100 MG tablet Take 1 tablet (100 mg total) by mouth every 2 (two) hours as needed for migraine. May repeat in 2 hours if headache persists or recurs. 10 tablet 6   No facility-administered medications prior to visit.    Allergies  Allergen Reactions   Tramadol Nausea And Vomiting   Vicodin [Hydrocodone-Acetaminophen] Nausea And Vomiting       Objective:    BP 117/81   Pulse 67   Resp 16   Wt 175 lb 12.8 oz (79.7 kg)   LMP 06/12/2014 (Exact Date)   SpO2 98%   BMI 26.73 kg/m  Wt Readings from Last 3 Encounters:  09/17/22 175 lb 12.8 oz (79.7 kg)  03/29/22 186 lb (84.4 kg)  02/16/22 182 lb 4 oz (82.7 kg)    Physical Exam Constitutional:      Appearance: Normal appearance.  HENT:     Head: Normocephalic and atraumatic.     Mouth/Throat:     Mouth: Mucous membranes are moist.     Pharynx: Oropharynx is clear.     Comments: Denture to upper side appreciated.  Eyes:     General: Lids are normal.     Extraocular Movements: Extraocular movements intact.     Pupils: Pupils are equal, round, and reactive to light.  Cardiovascular:     Rate and Rhythm: Normal rate and regular rhythm.     Pulses: Normal pulses.  Pulmonary:     Effort: Pulmonary effort is normal.     Breath sounds: Normal breath sounds.  Abdominal:     General: Bowel sounds are normal.     Palpations: Abdomen is soft.  Musculoskeletal:        General: Normal range of motion.     Cervical back: Normal range of motion and neck supple.  Skin:    General: Skin is warm and dry.  Neurological:     Mental Status: She is alert and oriented to person, place, and time.  Psychiatric:        Mood and Affect: Mood normal.        Behavior: Behavior normal.     Comments: Uses sign language.     Assessment & Plan:  [name] is a 47 year old female here with a history of migraines, congenital deafness, who presents to the clinic with complaints of tingling to  her lower extremities and left shoulder.  This visit was facilitated by Olegario Messier of the sign language services.  Patient is under the care of of neurology with upcoming appointment in July of this year.  She has tingling to her lower extremities and sometimes to her left shoulder.  Symptoms worsen when she is sitting but subside with activities.  Her migraines are well-controlled and patient has not needed her Imitrex for long time.  PLAN: 1) Tingling in Extremities:   -Labs today including CBC, CMP, vitamin B12,  vitamin D, TSH/free T4 and magnesium.  -Patient reminded to follow up with neurology as scheduled in July 2024.  -Report new or worsening symptoms to the clinic or local emergency room.  -Take ibuprofen as needed for symptoms and stay hydrated. Take Cymbalta as ordered.  -Increase physical activities as tolerated, verbalized understanding.  2) Migraine without aura and without status migrainosus, not intractable:  -Continue taking the medications (Cymbalta and Imitrex) as prescribed  -Use Imitrex as needed as ordered.  -Report new symptoms to the clinic or local emergency room, verbalized understanding.  -Avoid triggering events such as increased stress to prevent migraine recurrence.  3) Tobacco dependence:  -Cut back on smoking, come to the clinic whenever she is ready to quit smoking for help.  Verbalized understanding.   Patient has been counseled extensively about nutrition and exercise as well as the importance of adherence with medications and regular follow-up. The patient was given clear instructions to go to ER or return to medical center if symptoms don't improve, worsen or new problems develop. The patient verbalized understanding.    Follow-up: No follow-ups on file.     Eleonore Chiquito, FNP  [facility] and [facility] [place], [place] [phone]   09/17/2022, 10:52 AM

## 2022-09-18 LAB — CBC WITH DIFFERENTIAL/PLATELET
Basophils Absolute: 0.1 10*3/uL (ref 0.0–0.2)
Basos: 1 %
EOS (ABSOLUTE): 0.2 10*3/uL (ref 0.0–0.4)
Eos: 5 %
Immature Grans (Abs): 0 10*3/uL (ref 0.0–0.1)
Lymphocytes Absolute: 2.5 10*3/uL (ref 0.7–3.1)
MCHC: 34.4 g/dL (ref 31.5–35.7)
MCV: 92 fL (ref 79–97)
Neutrophils: 37 %
Platelets: 470 10*3/uL — ABNORMAL HIGH (ref 150–450)

## 2022-09-18 LAB — CMP14+EGFR
ALT: 18 IU/L (ref 0–32)
AST: 23 IU/L (ref 0–40)
BUN: 8 mg/dL (ref 6–24)
CO2: 24 mmol/L (ref 20–29)
Glucose: 82 mg/dL (ref 70–99)

## 2022-09-18 LAB — T4, FREE: Free T4: 0.82 ng/dL (ref 0.82–1.77)

## 2022-09-19 LAB — CBC WITH DIFFERENTIAL/PLATELET
Hematocrit: 38.4 % (ref 34.0–46.6)
Hemoglobin: 13.2 g/dL (ref 11.1–15.9)
Immature Granulocytes: 0 %
Lymphs: 47 %
MCH: 31.7 pg (ref 26.6–33.0)
Monocytes Absolute: 0.6 10*3/uL (ref 0.1–0.9)
Monocytes: 10 %
Neutrophils Absolute: 2 10*3/uL (ref 1.4–7.0)
RBC: 4.16 x10E6/uL (ref 3.77–5.28)
RDW: 13.1 % (ref 11.7–15.4)
WBC: 5.4 10*3/uL (ref 3.4–10.8)

## 2022-09-19 LAB — CMP14+EGFR
Albumin/Globulin Ratio: 1.8 (ref 1.2–2.2)
Albumin: 4.1 g/dL (ref 3.9–4.9)
Alkaline Phosphatase: 92 IU/L (ref 44–121)
BUN/Creatinine Ratio: 10 (ref 9–23)
Bilirubin Total: 0.3 mg/dL (ref 0.0–1.2)
Calcium: 9.4 mg/dL (ref 8.7–10.2)
Chloride: 102 mmol/L (ref 96–106)
Creatinine, Ser: 0.77 mg/dL (ref 0.57–1.00)
Globulin, Total: 2.3 g/dL (ref 1.5–4.5)
Potassium: 4.1 mmol/L (ref 3.5–5.2)
Sodium: 139 mmol/L (ref 134–144)
Total Protein: 6.4 g/dL (ref 6.0–8.5)
eGFR: 97 mL/min/{1.73_m2} (ref >59)

## 2022-09-19 LAB — VITAMIN D 25 HYDROXY (VIT D DEFICIENCY, FRACTURES): Vit D, 25-Hydroxy: 20.7 ng/mL — ABNORMAL LOW (ref 30.0–100.0)

## 2022-09-19 LAB — VITAMIN B12: Vitamin B-12: 918 pg/mL (ref 232–1245)

## 2022-09-19 LAB — TSH: TSH: 0.315 u[IU]/mL — ABNORMAL LOW (ref 0.450–4.500)

## 2022-09-19 LAB — MAGNESIUM: Magnesium: 2.4 mg/dL — ABNORMAL HIGH (ref 1.6–2.3)

## 2022-09-19 LAB — HEMOGLOBIN A1C
Est. average glucose Bld gHb Est-mCnc: 117 mg/dL
Hgb A1c MFr Bld: 5.7 % — ABNORMAL HIGH (ref 4.8–5.6)

## 2022-09-20 ENCOUNTER — Other Ambulatory Visit: Payer: Self-pay | Admitting: Family

## 2022-09-20 DIAGNOSIS — R7989 Other specified abnormal findings of blood chemistry: Secondary | ICD-10-CM

## 2022-09-20 DIAGNOSIS — E559 Vitamin D deficiency, unspecified: Secondary | ICD-10-CM

## 2022-09-20 MED ORDER — VITAMIN D3 50 MCG (2000 UT) PO CAPS
2000.0000 [IU] | ORAL_CAPSULE | Freq: Every day | ORAL | 2 refills | Status: DC
Start: 2022-09-20 — End: 2023-04-11

## 2022-09-20 NOTE — Progress Notes (Signed)
Vitamin D supplements ordered. Thyroid function test repeat scheduled in 30 days.

## 2022-09-22 DIAGNOSIS — F4310 Post-traumatic stress disorder, unspecified: Secondary | ICD-10-CM | POA: Diagnosis not present

## 2022-09-28 ENCOUNTER — Encounter (INDEPENDENT_AMBULATORY_CARE_PROVIDER_SITE_OTHER): Payer: Self-pay

## 2022-09-29 DIAGNOSIS — F4310 Post-traumatic stress disorder, unspecified: Secondary | ICD-10-CM | POA: Diagnosis not present

## 2022-10-05 ENCOUNTER — Telehealth: Payer: Self-pay

## 2022-10-05 NOTE — Telephone Encounter (Signed)
Pt given lab results per notes of Jomarie Longs, NP on 10/05/22. Pt verbalized understanding using Interior and spatial designer. Scheduled lab appt for 10/18/22 at 1530.   Latasha Chiquito, FNP 09/20/2022  9:52 PM EDT     Please inform the patient that she was prescribed Vitamin D supplements. Also, her Thyroid function test repeat is scheduled in 30 days due to low TSH levels.

## 2022-10-07 ENCOUNTER — Other Ambulatory Visit: Payer: Self-pay | Admitting: Internal Medicine

## 2022-10-07 DIAGNOSIS — G43009 Migraine without aura, not intractable, without status migrainosus: Secondary | ICD-10-CM

## 2022-10-13 DIAGNOSIS — F4310 Post-traumatic stress disorder, unspecified: Secondary | ICD-10-CM | POA: Diagnosis not present

## 2022-10-18 ENCOUNTER — Other Ambulatory Visit: Payer: Medicaid Other

## 2022-11-03 ENCOUNTER — Other Ambulatory Visit: Payer: Self-pay

## 2022-11-03 ENCOUNTER — Other Ambulatory Visit: Payer: Self-pay | Admitting: Psychiatry

## 2022-11-03 DIAGNOSIS — F4310 Post-traumatic stress disorder, unspecified: Secondary | ICD-10-CM | POA: Diagnosis not present

## 2022-11-10 DIAGNOSIS — F4310 Post-traumatic stress disorder, unspecified: Secondary | ICD-10-CM | POA: Diagnosis not present

## 2022-12-01 DIAGNOSIS — F4310 Post-traumatic stress disorder, unspecified: Secondary | ICD-10-CM | POA: Diagnosis not present

## 2022-12-09 ENCOUNTER — Ambulatory Visit: Payer: Medicaid Other | Attending: Internal Medicine | Admitting: Internal Medicine

## 2022-12-09 DIAGNOSIS — Z5321 Procedure and treatment not carried out due to patient leaving prior to being seen by health care provider: Secondary | ICD-10-CM

## 2022-12-10 NOTE — Progress Notes (Signed)
Pt left before being seen. She has rescheduled.

## 2022-12-21 ENCOUNTER — Ambulatory Visit: Payer: Medicaid Other | Admitting: Adult Health

## 2022-12-30 ENCOUNTER — Ambulatory Visit: Payer: Medicaid Other | Attending: Internal Medicine | Admitting: Internal Medicine

## 2022-12-30 ENCOUNTER — Encounter: Payer: Self-pay | Admitting: Internal Medicine

## 2022-12-30 VITALS — BP 108/72 | HR 64 | Temp 98.9°F | Wt 170.0 lb

## 2022-12-30 DIAGNOSIS — R202 Paresthesia of skin: Secondary | ICD-10-CM | POA: Diagnosis not present

## 2022-12-30 DIAGNOSIS — I8393 Asymptomatic varicose veins of bilateral lower extremities: Secondary | ICD-10-CM | POA: Diagnosis not present

## 2022-12-30 DIAGNOSIS — E559 Vitamin D deficiency, unspecified: Secondary | ICD-10-CM

## 2022-12-30 DIAGNOSIS — R7989 Other specified abnormal findings of blood chemistry: Secondary | ICD-10-CM | POA: Diagnosis not present

## 2022-12-30 DIAGNOSIS — F17200 Nicotine dependence, unspecified, uncomplicated: Secondary | ICD-10-CM

## 2022-12-30 DIAGNOSIS — G5692 Unspecified mononeuropathy of left upper limb: Secondary | ICD-10-CM

## 2022-12-30 DIAGNOSIS — J438 Other emphysema: Secondary | ICD-10-CM

## 2022-12-30 DIAGNOSIS — R7303 Prediabetes: Secondary | ICD-10-CM | POA: Diagnosis not present

## 2022-12-30 MED ORDER — GABAPENTIN 300 MG PO CAPS
300.0000 mg | ORAL_CAPSULE | Freq: Every day | ORAL | 3 refills | Status: DC
Start: 2022-12-30 — End: 2023-05-15

## 2022-12-30 NOTE — Patient Instructions (Signed)
Prediabetes Eating Plan Prediabetes is a condition that causes blood sugar (glucose) levels to be higher than normal. This increases the risk for developing type 2 diabetes (type 2 diabetes mellitus). Working with a health care provider or nutrition specialist (dietitian) to make diet and lifestyle changes can help prevent the onset of diabetes. These changes may help you: Control your blood glucose levels. Improve your cholesterol levels. Manage your blood pressure. What are tips for following this plan? Reading food labels Read food labels to check the amount of fat, salt (sodium), and sugar in prepackaged foods. Avoid foods that have: Saturated fats. Trans fats. Added sugars. Avoid foods that have more than 300 milligrams (mg) of sodium per serving. Limit your sodium intake to less than 2,300 mg each day. Shopping Avoid buying pre-made and processed foods. Avoid buying drinks with added sugar. Cooking Cook with olive oil. Do not use butter, lard, or ghee. Bake, broil, grill, steam, or boil foods. Avoid frying. Meal planning  Work with your dietitian to create an eating plan that is right for you. This may include tracking how many calories you take in each day. Use a food diary, notebook, or mobile application to track what you eat at each meal. Consider following a Mediterranean diet. This includes: Eating several servings of fresh fruits and vegetables each day. Eating fish at least twice a week. Eating one serving each day of whole grains, beans, nuts, and seeds. Using olive oil instead of other fats. Limiting alcohol. Limiting red meat. Using nonfat or low-fat dairy products. Consider following a plant-based diet. This includes dietary choices that focus on eating mostly vegetables and fruit, grains, beans, nuts, and seeds. If you have high blood pressure, you may need to limit your sodium intake or follow a diet such as the DASH (Dietary Approaches to Stop Hypertension) eating  plan. The DASH diet aims to lower high blood pressure. Lifestyle Set weight loss goals with help from your health care team. It is recommended that most people with prediabetes lose 7% of their body weight. Exercise for at least 30 minutes 5 or more days a week. Attend a support group or seek support from a mental health counselor. Take over-the-counter and prescription medicines only as told by your health care provider. What foods are recommended? Fruits Berries. Bananas. Apples. Oranges. Grapes. Papaya. Mango. Pomegranate. Kiwi. Grapefruit. Cherries. Vegetables Lettuce. Spinach. Peas. Beets. Cauliflower. Cabbage. Broccoli. Carrots. Tomatoes. Squash. Eggplant. Herbs. Peppers. Onions. Cucumbers. Brussels sprouts. Grains Whole grains, such as whole-wheat or whole-grain breads, crackers, cereals, and pasta. Unsweetened oatmeal. Bulgur. Barley. Quinoa. Brown rice. Corn or whole-wheat flour tortillas or taco shells. Meats and other proteins Seafood. Poultry without skin. Lean cuts of pork and beef. Tofu. Eggs. Nuts. Beans. Dairy Low-fat or fat-free dairy products, such as yogurt, cottage cheese, and cheese. Beverages Water. Tea. Coffee. Sugar-free or diet soda. Seltzer water. Low-fat or nonfat milk. Milk alternatives, such as soy or almond milk. Fats and oils Olive oil. Canola oil. Sunflower oil. Grapeseed oil. Avocado. Walnuts. Sweets and desserts Sugar-free or low-fat pudding. Sugar-free or low-fat ice cream and other frozen treats. Seasonings and condiments Herbs. Sodium-free spices. Mustard. Relish. Low-salt, low-sugar ketchup. Low-salt, low-sugar barbecue sauce. Low-fat or fat-free mayonnaise. The items listed above may not be a complete list of recommended foods and beverages. Contact a dietitian for more information. What foods are not recommended? Fruits Fruits canned with syrup. Vegetables Canned vegetables. Frozen vegetables with butter or cream sauce. Grains Refined white  flour and flour   products, such as bread, pasta, snack foods, and cereals. Meats and other proteins Fatty cuts of meat. Poultry with skin. Breaded or fried meat. Processed meats. Dairy Full-fat yogurt, cheese, or milk. Beverages Sweetened drinks, such as iced tea and soda. Fats and oils Butter. Lard. Ghee. Sweets and desserts Baked goods, such as cake, cupcakes, pastries, cookies, and cheesecake. Seasonings and condiments Spice mixes with added salt. Ketchup. Barbecue sauce. Mayonnaise. The items listed above may not be a complete list of foods and beverages that are not recommended. Contact a dietitian for more information. Where to find more information American Diabetes Association: www.diabetes.org Summary You may need to make diet and lifestyle changes to help prevent the onset of diabetes. These changes can help you control blood sugar, improve cholesterol levels, and manage blood pressure. Set weight loss goals with help from your health care team. It is recommended that most people with prediabetes lose 7% of their body weight. Consider following a Mediterranean diet. This includes eating plenty of fresh fruits and vegetables, whole grains, beans, nuts, seeds, fish, and low-fat dairy, and using olive oil instead of other fats. This information is not intended to replace advice given to you by your health care provider. Make sure you discuss any questions you have with your health care provider. Document Revised: 08/08/2019 Document Reviewed: 08/08/2019 Elsevier Patient Education  2024 [employer]  

## 2022-12-30 NOTE — Progress Notes (Signed)
Patient ID: Latasha Parker, female    DOB: Jun 04, 1975  MRN: [id number]  CC: Follow-up (Follow-up. Med refills - cymbalta/Tingling sensation on R & L leg, intermittent swelling X2 yrs/Varicose veins/Intermittent burning sensation on thumb, difficulty picking things up )   Subjective: Latasha Parker is a 47 y.o. female who presents for chronic ds management.  Delcie Roch from [facility] is with her to do sign language.  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 Goes away if she moves around. B12 level nl 08/2022.  TSH was low but free T4 nl.  Had EMG LE last yr that revealed some tibial neuropathy. A1C was 5.7. Vit D level was 20.  pt states she was not informed of labs.  However, she states she has a rxn for vit D that she is taking daily.  No sure of dose.  CBC continues to show thrombocytosis but this has been declining towards normal range over the past year.  Getting some veins in legs, LT>>RT.  No pain, burning or swelling.  C/o burning sensation LT thumb x 7-8 mths.  Getting worse.  Intermittent. Last 10-15 mins. Worse if she picks something up the wrong way.  Tobacco dependence: Reports that she has cut back and trying to quit.  Recently lost her mother. COPD: Denies any shortness of breath.  Not having to use any inhalers. Patient Active Problem List   Diagnosis Date Noted   Thrombocytosis 08/31/2021   Elevated platelet count 08/10/2021   Migraine without aura and without status migrainosus, not intractable 05/27/2021   Tinnitus of right ear 05/27/2021   Congenital deafness 05/27/2021   Peripheral polyneuropathy 05/27/2021   Tobacco dependence 05/27/2021   Generalized anxiety disorder 05/27/2021   Anxiety 08/14/2018   Post-operative state 08/12/2014   Dermoid cyst 06/19/2014   Health care maintenance  10/09/2013   Right knee pain 10/09/2013   Insomnia 09/09/2013   COPD (chronic obstructive pulmonary disease) with chronic bronchitis 09/09/2013   Tobacco abuse 09/09/2013   Endometriosis 11/27/2012   Menorrhagia 01/13/2012   Dysmenorrhea 01/13/2012   Back pain 01/13/2012   Ovarian cyst 01/13/2012     Current Outpatient Medications on File Prior to Visit  Medication Sig Dispense Refill   DULoxetine (CYMBALTA) 20 MG capsule TAKE 2 CAPSULES BY MOUTH EVERY DAY 60 capsule 2   FLOVENT HFA 44 MCG/ACT inhaler INHALE 2 PUFFS BY MOUTH TWICE A DAY (Patient not taking: Reported on 02/16/2022) 31.8 Inhaler 3   naproxen (NAPROSYN) 500 MG tablet TAKE 1 TABLET (500 MG TOTAL) BY MOUTH TWICE A DAY WITH FOOD AS NEEDED (Patient not taking: Reported on 12/30/2022) 30 tablet 0   nicotine (NICODERM CQ - DOSED IN MG/24 HOURS) 21 mg/24hr patch Place 1 patch (21 mg total) onto the skin daily. (Patient not taking: Reported on 12/30/2022) 28 patch 0   promethazine (PHENERGAN) 12.5 MG tablet TAKE 1 TABLET BY MOUTH EVERY DAY AS NEEDED NAUSEA/VOMITING (Patient not taking: Reported on 12/30/2022) 15 tablet 0   SUMAtriptan (IMITREX) 100 MG tablet Take 1 tablet (100 mg total) by mouth every 2 (two) hours as needed for migraine. May repeat in 2 hours if headache persists or recurs. (Patient not taking: Reported on 12/30/2022) 10 tablet 6   No current facility-administered medications on file prior to visit.    Allergies  Allergen Reactions   Tramadol Nausea And  Vomiting   Vicodin [Hydrocodone-Acetaminophen] Nausea And Vomiting    Social History   Socioeconomic History   Marital status: Significant Other    Spouse name: Not on file   Number of children: 2   Years of education: Not on file   Highest education level: High school graduate  Occupational History   Occupation: unemployed  Tobacco Use   Smoking status: Every Day    Current packs/day: 0.50    Average packs/day: 0.5 packs/day for 15.0 years (7.5 ttl pk-yrs)     Types: Cigarettes   Smokeless tobacco: Never   Tobacco comments:    04/10/2019 reports down to 1/2 pack per day  Vaping Use   Vaping status: Never Used  Substance and Sexual Activity   Alcohol use: No   Drug use: No   Sexual activity: Not Currently    Birth control/protection: None  Other Topics Concern   Not on file  Social History Narrative   Caffeine- coffee 1-2 c, 4-5  Mtn Dew daily   Social Determinants of Health   Financial Resource Strain: Medium Risk (09/28/2021)   Overall Financial Resource Strain (CARDIA)    Difficulty of Paying Living Expenses: Somewhat hard  Food Insecurity: Food Insecurity Present (09/28/2021)   Hunger Vital Sign    Worried About Running Out of Food in the Last Year: Sometimes true    Ran Out of Food in the Last Year: Sometimes true  Transportation Needs: No Transportation Needs (09/28/2021)   PRAPARE - Administrator, Civil Service (Medical): No    Lack of Transportation (Non-Medical): No  Physical Activity: Not on file  Stress: Not on file  Social Connections: Not on file  Intimate Partner Violence: Not on file    Family History  Problem Relation Age of Onset   Cancer Mother    Cancer Father    Cancer Maternal Grandmother    Breast cancer Maternal Grandmother    Breast cancer Paternal Aunt     Past Surgical History:  Procedure Laterality Date   DIAGNOSTIC LAPAROSCOPY  2003   ovarian cyst removed   FOOT SURGERY     LAPAROSCOPIC ASSISTED VAGINAL HYSTERECTOMY N/A 08/12/2014   Procedure: LAPAROSCOPIC ASSISTED VAGINAL HYSTERECTOMY;  Surgeon: Allie Bossier, MD;  Location: [facility];  Service: Gynecology;  Laterality: N/A;   LAPAROSCOPIC SALPINGO OOPHERECTOMY Right 08/12/2014   Procedure: REMOVE RIGHT SALPINGO OOPHORECTOMY;  Surgeon: Allie Bossier, MD;  Location: [facility];  Service: Gynecology;  Laterality: Right;   LAPAROSCOPY N/A 07/17/2012   Procedure: LAPAROSCOPY OPERATIVE;  Surgeon: Catalina Antigua, MD;  Location: [facility];  Service:  Gynecology;  Laterality: N/A;  left oopherectomy and salpingectomy   MULTIPLE EXTRACTIONS WITH ALVEOLOPLASTY N/A 06/06/2014   Procedure: MULTIPLE EXTRACTIONS WITH ALVEOLOPLASTY/REMOVAL OF TORI;  Surgeon: Georgia Lopes, DDS;  Location: [facility];  Service: Oral Surgery;  Laterality: N/A;   ovary tumor     cyst   WISDOM TOOTH EXTRACTION      ROS: Review of Systems Negative except as stated above  PHYSICAL EXAM: BP 108/72 (BP Location: Left Arm, Patient Position: Sitting, Cuff Size: Normal)   Pulse 64   Temp 98.9 F (37.2 C) (Oral)   Wt 170 lb (77.1 kg)   LMP 06/12/2014 (Exact Date)   SpO2 94%   BMI 25.85 kg/m   Physical Exam  General appearance - alert, well appearing, and in no distress Mental status - normal mood, behavior,  dress, motor activity, and thought processes Neck - supple, no  significant adenopathy Chest - clear to auscultation, no wheezes, rales or rhonchi, symmetric air entry Heart - normal rate, regular rhythm, normal S1, S2, no murmurs, rubs, clicks or gallops Musculoskeletal -hands: No intrinsic muscle wasting.  Grip 5/5 bilaterally.  Tinel's sign negative. Extremities -no lower extremity edema.  She has patches of small spider veins on both lower legs. Neuro: Power in both lower extremities 5/5 bilaterally.  Gross sensation intact in both lower extremities     Latest Ref Rng & Units 09/17/2022   11:14 AM 08/10/2021    1:22 PM 05/27/2021    3:16 PM  CMP  Glucose 70 - 99 mg/dL 82  93  82   BUN 6 - 24 mg/dL 8  13  8    Creatinine 0.57 - 1.00 mg/dL 1.30  8.65  7.84   Sodium 134 - 144 mmol/L 139  139  141   Potassium 3.5 - 5.2 mmol/L 4.1  3.7  4.5   Chloride 96 - 106 mmol/L 102  106  103   CO2 20 - 29 mmol/L 24  26  23    Calcium 8.7 - 10.2 mg/dL 9.4  9.2  9.5   Total Protein 6.0 - 8.5 g/dL 6.4  7.1  6.6   Total Bilirubin 0.0 - 1.2 mg/dL 0.3  0.3  <6.9   Alkaline Phos 44 - 121 IU/L 92  113  138   AST 0 - 40 IU/L 23  19  28    ALT 0 - 32 IU/L 18  17  22     Lipid  Panel  No results found for: "CHOL", "TRIG", "HDL", "CHOLHDL", "VLDL", "LDLCALC", "LDLDIRECT"  CBC    Component Value Date/Time   WBC 5.4 09/17/2022 1114   WBC 7.9 08/31/2021 0943   WBC 9.2 11/19/2017 1123   RBC 4.16 09/17/2022 1114   RBC 4.35 08/31/2021 0943   HGB 13.2 09/17/2022 1114   HCT 38.4 09/17/2022 1114   PLT 470 (H) 09/17/2022 1114   MCV 92 09/17/2022 1114   MCH 31.7 09/17/2022 1114   MCH 30.8 08/31/2021 0943   MCHC 34.4 09/17/2022 1114   MCHC 33.8 08/31/2021 0943   RDW 13.1 09/17/2022 1114   LYMPHSABS 2.5 09/17/2022 1114   MONOABS 0.7 08/31/2021 0943   EOSABS 0.2 09/17/2022 1114   BASOSABS 0.1 09/17/2022 1114    ASSESSMENT AND PLAN: 1. Tingling in extremities Some peripheral neuropathy most likely due to small fiber disease that can be associated with prediabetes.  Discussed trying her with gabapentin at bedtime.  Advised that the medication can cause drowsiness and can sometimes cause swelling in the extremities. - gabapentin (NEURONTIN) 300 MG capsule; Take 1 capsule (300 mg total) by mouth at bedtime.  Dispense: 30 capsule; Refill: 3  2. Asymptomatic varicose veins of both lower extremities Asymptomatic.  I recommend using some compression socks especially when she anticipates doing a lot of standing  3. Abnormal thyroid blood test Recheck thyroid panel today - TSH+T4F+T3Free  4. Prediabetes Discussed the importance of healthy eating habits and regular exercise to prevent progression to full diabetes. Patient advised to eliminate sugary drinks from the diet, cut back on portion sizes especially of white carbohydrates, eat more white lean meat like chicken [place] and seafood instead of beef or pork and incorporate fresh fruits and vegetables into the diet daily. Patient currently walks her dog 3 times a day for 10 to 15 minutes at a time.  Encouraged her to keep this up. - Hemoglobin A1c  5. Neuropathy  of hand, left - gabapentin (NEURONTIN) 300 MG capsule;  Take 1 capsule (300 mg total) by mouth at bedtime.  Dispense: 30 capsule; Refill: 3  6. Other emphysema (HCC) Asymptomatic.  Strongly advised to quit smoking  7. Vitamin D deficiency We will recheck vitamin D level to see if it has normalized. - VITAMIN D 25 Hydroxy (Vit-D Deficiency, Fractures)  8. Tobacco dependence Advised to quit smoking.    Patient was given the opportunity to ask questions.  Patient verbalized understanding of the plan and was able to repeat key elements of the plan.   This documentation was completed using Paediatric nurse.  Any transcriptional errors are unintentional.  Orders Placed This Encounter  Procedures   TSH+T4F+T3Free   VITAMIN D 25 Hydroxy (Vit-D Deficiency, Fractures)   Hemoglobin A1c     Requested Prescriptions   Signed Prescriptions Disp Refills   gabapentin (NEURONTIN) 300 MG capsule 30 capsule 3    Sig: Take 1 capsule (300 mg total) by mouth at bedtime.    Return in about 4 months (around 05/01/2023).  Jonah Blue, MD, FACP

## 2023-01-05 DIAGNOSIS — F4310 Post-traumatic stress disorder, unspecified: Secondary | ICD-10-CM | POA: Diagnosis not present

## 2023-01-13 ENCOUNTER — Telehealth: Payer: Self-pay | Admitting: Internal Medicine

## 2023-01-13 NOTE — Telephone Encounter (Signed)
Used sign language interpreter # [id number], pt. Given lab results and instructions. OK for endocrinology referral. Also asking how much Vit D she should be taking. Vit D is not on medication list.

## 2023-01-16 NOTE — Telephone Encounter (Signed)
OTC vitamin D will suffice.

## 2023-01-16 NOTE — Telephone Encounter (Signed)
VIt D. WNL.  Does she need to tatke OTC vit D daily or d/c alltogether?

## 2023-01-17 NOTE — Telephone Encounter (Signed)
Left message on voicemail using a sign language interpreter, ID [id number].  Patient advised to take vitamin D daily.  Advised to call office for additional questions.

## 2023-01-19 DIAGNOSIS — F4310 Post-traumatic stress disorder, unspecified: Secondary | ICD-10-CM | POA: Diagnosis not present

## 2023-01-20 ENCOUNTER — Ambulatory Visit: Payer: Self-pay

## 2023-01-20 NOTE — Telephone Encounter (Signed)
Attempted to call patient- message call could not be completed- please check number. Unable to leave call back message

## 2023-01-20 NOTE — Telephone Encounter (Signed)
Summary: med management   Pt stated she was given medication gabapentin (NEURONTIN) 300 MG capsule for her leg; however, she feels like it's not helping her or doing anything. She stated it is like a shooting, bothersome pain.  Seeking clinical advice.      Called pt 2x - unable to complete the call. Have made 3 unsuccessful attempts tot reach pt. Will forward encounter to office for follow up.

## 2023-01-20 NOTE — Telephone Encounter (Signed)
Patient called, unable to left VM to return the call to the office to speak to the NT.    Summary: med management   Pt stated she was given medication gabapentin (NEURONTIN) 300 MG capsule for her leg; however, she feels like it's not helping her or doing anything. She stated it is like a shooting, bothersome pain.  Seeking clinical advice.

## 2023-01-28 ENCOUNTER — Other Ambulatory Visit: Payer: Self-pay | Admitting: Family

## 2023-01-28 DIAGNOSIS — E559 Vitamin D deficiency, unspecified: Secondary | ICD-10-CM

## 2023-02-02 ENCOUNTER — Other Ambulatory Visit: Payer: Self-pay | Admitting: Psychiatry

## 2023-02-06 NOTE — Telephone Encounter (Signed)
Last seen on 02/16/22 No follow up scheduled

## 2023-02-10 ENCOUNTER — Other Ambulatory Visit: Payer: Self-pay | Admitting: Psychiatry

## 2023-02-10 ENCOUNTER — Other Ambulatory Visit: Payer: Self-pay | Admitting: Neurology

## 2023-02-10 ENCOUNTER — Telehealth: Payer: Self-pay | Admitting: Internal Medicine

## 2023-02-10 MED ORDER — DULOXETINE HCL 20 MG PO CPEP: 40.0000 mg | ORAL_CAPSULE | Freq: Every day | ORAL | 2 refills | Status: DC

## 2023-02-10 NOTE — Telephone Encounter (Signed)
Opened In Error

## 2023-02-10 NOTE — Progress Notes (Signed)
I received call from [facility] answering service to reorder Cymbalta which I did

## 2023-02-14 NOTE — Telephone Encounter (Signed)
Was renewed 02-10-2023

## 2023-03-23 ENCOUNTER — Ambulatory Visit: Payer: Self-pay | Admitting: *Deleted

## 2023-03-23 NOTE — Telephone Encounter (Signed)
  Chief Complaint: Patient is calling about her symptoms- patient states she started on gabapentin (NEURONTIN) 300 MG capsule and it did help- but she is starting to have the symptoms come back Symptoms: bilateral tingling feet/leg- not all the time Frequency: not all the time- but coming back  Disposition: [] ED /[] Urgent Care (no appt availability in office) / [] Appointment(In office/virtual)/ []  [place] [facility]/ [] Home Care/ [] Refused Recommended Disposition /[] [place] [facility]/ [x]  Follow-up with PCP Additional Notes: Patient wonders if medication needs adjusting or if it is going to work

## 2023-03-23 NOTE — Telephone Encounter (Signed)
Please advise 

## 2023-03-23 NOTE — Telephone Encounter (Signed)
Reason for Disposition  [1] Caller has NON-URGENT medicine question about med that PCP prescribed AND [2] triager unable to answer question  Answer Assessment - Initial Assessment Questions 1. ONSET: "When did the pain start?"      Bilateral shooting sensation- tingling 2. LOCATION: "Where is the pain located?"      *No Answer* 3. PAIN: "How bad is the pain?"    (Scale 1-10; or mild, moderate, severe)   -  MILD (1-3): doesn't interfere with normal activities    -  MODERATE (4-7): interferes with normal activities (e.g., work or school) or awakens from sleep, limping    -  SEVERE (8-10): excruciating pain, unable to do any normal activities, unable to walk     Shooting pain "feeling" in leg 4. WORK OR EXERCISE: "Has there been any recent work or exercise that involved this part of the body?"      *No Answer* 5. CAUSE: "What do you think is causing the leg pain?"     *No Answer* 6. OTHER SYMPTOMS: "Do you have any other symptoms?" (e.g., chest pain, back pain, breathing difficulty, swelling, rash, fever, numbness, weakness)     *No Answer* 7. PREGNANCY: "Is there any chance you are pregnant?" "When was your last menstrual period?"     *No Answer*  Answer Assessment - Initial Assessment Questions 1. SYMPTOM: "What is the main symptom you are concerned about?" (e.g., weakness, numbness)     Bilateral tingling- feet to knee 2. ONSET: "When did this start?" (minutes, hours, days; while sleeping)     2 years- but PCP is aware 3. LAST NORMAL: "When was the last time you (the patient) were normal (no symptoms)?"     Patient was calling today to get relief medication not helping completely Patient uses gabapentin - it did help originally- but as time has passed it is not helping as much- sensation comes and goes now- patient is in second refill.  Answer Assessment - Initial Assessment Questions 1. NAME of MEDICINE: "What medicine(s) are you calling about?"     gabapentin (NEURONTIN) 300 MG  capsule 2. QUESTION: "What is your question?" (e.g., double dose of medicine, side effect)     Patient states she started gabapentin (NEURONTIN) 300 MG capsule 1-2 months ago for tingling in feet/leg- it did help at first- but she states she is now starting to have symptoms again. Not all the time but intermittently  3. PRESCRIBER: "Who prescribed the medicine?" Reason: if prescribed by specialist, call should be referred to that group.     PCP 4. SYMPTOMS: "Do you have any symptoms?" If Yes, ask: "What symptoms are you having?"  "How bad are the symptoms (e.g., mild, moderate, severe)     Tingling in feet/leg  Protocols used: Leg Pain-A-AH, Neurologic Deficit-A-AH, Medication Question Call-A-AH

## 2023-03-23 NOTE — Telephone Encounter (Signed)
We can increase the gabapentin from 300 mg once a day to 300 mg twice a day.  If she is in agreement with this, I can send an updated prescription to her pharmacy.

## 2023-03-24 NOTE — Telephone Encounter (Signed)
Call could not be completed. 

## 2023-03-27 NOTE — Telephone Encounter (Signed)
Call could not be completed. 

## 2023-03-28 NOTE — Telephone Encounter (Signed)
Letter sent.

## 2023-03-29 ENCOUNTER — Ambulatory Visit: Payer: Medicaid Other

## 2023-04-11 ENCOUNTER — Other Ambulatory Visit: Payer: Self-pay | Admitting: Family

## 2023-04-11 DIAGNOSIS — E559 Vitamin D deficiency, unspecified: Secondary | ICD-10-CM

## 2023-05-05 ENCOUNTER — Ambulatory Visit: Payer: Medicaid Other | Admitting: Internal Medicine

## 2023-05-12 ENCOUNTER — Other Ambulatory Visit: Payer: Self-pay | Admitting: Neurology

## 2023-05-15 ENCOUNTER — Other Ambulatory Visit: Payer: Self-pay | Admitting: Internal Medicine

## 2023-05-15 ENCOUNTER — Other Ambulatory Visit: Payer: Self-pay | Admitting: Psychiatry

## 2023-05-15 ENCOUNTER — Ambulatory Visit: Payer: Self-pay

## 2023-05-15 DIAGNOSIS — R202 Paresthesia of skin: Secondary | ICD-10-CM

## 2023-05-15 DIAGNOSIS — G5692 Unspecified mononeuropathy of left upper limb: Secondary | ICD-10-CM

## 2023-05-15 MED ORDER — DULOXETINE HCL 20 MG PO CPEP: 40.0000 mg | ORAL_CAPSULE | Freq: Every day | ORAL | 0 refills | Status: DC

## 2023-05-15 NOTE — Telephone Encounter (Signed)
Pt has called and rescheduled her missed appointment (also on wait list) pt is now asking for a refill on her DULoxetine (CYMBALTA) 20 MG capsule [phone]

## 2023-05-15 NOTE — Telephone Encounter (Signed)
Requested Prescriptions  Pending Prescriptions Disp Refills   gabapentin (NEURONTIN) 300 MG capsule [Pharmacy Med Name: GABAPENTIN 300 MG CAPSULE] 30 capsule 3    Sig: TAKE 1 CAPSULE BY MOUTH EVERYDAY AT BEDTIME     Neurology: Anticonvulsants - gabapentin Passed - 05/15/2023  1:47 PM      Passed - Cr in normal range and within 360 days    Creatinine  Date Value Ref Range Status  08/10/2021 0.77 0.44 - 1.00 mg/dL Final   Creatinine, Ser  Date Value Ref Range Status  09/17/2022 0.77 0.57 - 1.00 mg/dL Final         Passed - Completed PHQ-2 or PHQ-9 in the last 360 days      Passed - Valid encounter within last 12 months    Recent Outpatient Visits           4 months ago Tingling in extremities   [place] [facility] Of [place]. [facility] Marcine Matar, MD   5 months ago Patient left before evaluation by physician   [place] [facility] [place]. [facility] Marcine Matar, MD   8 months ago Tingling in extremities   [place] [facility] [place] [facility] Dept Of [place]. [facility] Eleonore Chiquito, FNP   1 year ago Tarsal tunnel syndrome of both lower extremities   [place] [facility] - A Dept Of [place]. [facility] Marcine Matar, MD   1 year ago Tendinitis of thumb   [place] [facility] [place] - A Dept Of [place]. [facility] Marcine Matar, MD       Future Appointments             In 2 months Laural Benes Binnie Rail, MD [facility] [place] - A Dept Of [place] Bridegroom. [facility]

## 2023-05-15 NOTE — Telephone Encounter (Signed)
  Chief Complaint: medication assistance Symptoms: NA Frequency: today Pertinent Negatives: NA Disposition: [] ED /[] Urgent Care (no appt availability in office) / [] Appointment(In office/virtual)/ []  [place] [facility]/ [x] Home Care/ [] Refused Recommended Disposition /[] [place] [facility]/ []  Follow-up with PCP Additional Notes: Interpreter [phone] [employer]. pt is requesting refill on gabapentin and Duloxetine. She states she has about 1 week left of medications. Pt scheduled OV for 07/20/23. Had missed appt but she didn't know about it since never got message and mom recently passed away. Advised pt Duloxetine was prescribed by Dr. Pearlean Brownie she would have to contact their office for refill. Provided pt with phone #. Also advised if medication meets protocol requirements rx would be refilled. No further assistance noted.   Summary: Medication questions   Pt wants to speak to a nurse about her medication  Best contact: [phone]         Reason for Disposition  [1] Caller requesting a prescription renewal (no refills left), no triage required, AND [2] triager able to renew prescription per department policy  Answer Assessment - Initial Assessment Questions 1. DRUG NAME: "What medicine do you need to have refilled?"     Gabapentin and duloxetine  2. REFILLS REMAINING: "How many refills are remaining?" (Note: The label on the medicine or pill bottle will show how many refills are remaining. If there are no refills remaining, then a renewal may be needed.)     0 4. PRESCRIBING HCP: "Who prescribed it?" Reason: If prescribed by specialist, call should be referred to that group.     Dr. Laural Benes  Protocols used: Medication Refill and Renewal Call-A-AH

## 2023-05-16 ENCOUNTER — Other Ambulatory Visit: Payer: Self-pay | Admitting: Internal Medicine

## 2023-05-16 DIAGNOSIS — G5692 Unspecified mononeuropathy of left upper limb: Secondary | ICD-10-CM

## 2023-05-16 DIAGNOSIS — R202 Paresthesia of skin: Secondary | ICD-10-CM

## 2023-05-16 MED ORDER — GABAPENTIN 300 MG PO CAPS: 300.0000 mg | ORAL_CAPSULE | Freq: Every day | ORAL | 0 refills | Status: DC

## 2023-05-16 NOTE — Telephone Encounter (Signed)
Pt calling via Interpreter to check status of this refill. Requesting call back [phone]

## 2023-05-16 NOTE — Telephone Encounter (Signed)
Medication Refill -  Most Recent Primary Care Visit:  Provider: Jonah Blue B  Department[facility]  Visit Type: OFFICE VISIT  Date: 12/30/2022  Medication:  Gabapentin 300 MG  Has the patient contacted their pharmacy? yes ((Agent: If yes, when and what did the pharmacy advise?)contact pcp, shows med received at pharmacy 12/23 a t1:48 but pt was told they odnt have it  Is this the correct pharmacy for this prescription? yes  This is the patient's preferred pharmacy:  [id number] [place], [place] - [address]  [address] Phone: [phone] Fax: [phone]   Has the prescription been filled recently? no  Is the patient out of the medication? yes  Has the patient been seen for an appointment in the last year OR does the patient have an upcoming appointment? yes  Can we respond through MyChart? yes  Agent: Please be advised that Rx refills may take up to 3 business days. We ask that you follow-up with your pharmacy.

## 2023-05-16 NOTE — Telephone Encounter (Signed)
Message states Rx not at pharmacy- will resend Requested Prescriptions  Pending Prescriptions Disp Refills   gabapentin (NEURONTIN) 300 MG capsule 90 capsule 0     Neurology: Anticonvulsants - gabapentin Passed - 05/16/2023  4:12 PM      Passed - Cr in normal range and within 360 days    Creatinine  Date Value Ref Range Status  08/10/2021 0.77 0.44 - 1.00 mg/dL Final   Creatinine, Ser  Date Value Ref Range Status  09/17/2022 0.77 0.57 - 1.00 mg/dL Final         Passed - Completed PHQ-2 or PHQ-9 in the last 360 days      Passed - Valid encounter within last 12 months    Recent Outpatient Visits           4 months ago Tingling in extremities   [place] [facility] Of [place]. [facility] Marcine Matar, MD   5 months ago Patient left before evaluation by physician   [place] [facility] - A Dept Of [place]. [facility] Marcine Matar, MD   8 months ago Tingling in extremities   [place] [facility] [place] - A Dept Of [place]. 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 [facility] Marcine Matar, MD   1 year ago Tendinitis of thumb   [place] [facility] [place] - [facility] Dept Of [place]. [facility] Marcine Matar, MD       Future Appointments             In 2 months Laural Benes Binnie Rail, MD [facility] - A Dept Of [place] Bridegroom. [facility]

## 2023-06-27 ENCOUNTER — Other Ambulatory Visit: Payer: Self-pay | Admitting: Neurology

## 2023-06-27 NOTE — Telephone Encounter (Signed)
Last seen on 02/16/22 Follow up scheduled on 10/26/23

## 2023-07-20 ENCOUNTER — Ambulatory Visit: Payer: Medicaid Other | Admitting: Internal Medicine

## 2023-09-18 ENCOUNTER — Ambulatory Visit: Payer: Medicaid Other | Attending: Internal Medicine | Admitting: Internal Medicine

## 2023-10-03 ENCOUNTER — Other Ambulatory Visit: Payer: Self-pay | Admitting: Internal Medicine

## 2023-10-03 DIAGNOSIS — G5692 Unspecified mononeuropathy of left upper limb: Secondary | ICD-10-CM

## 2023-10-03 DIAGNOSIS — R202 Paresthesia of skin: Secondary | ICD-10-CM

## 2023-10-23 ENCOUNTER — Ambulatory Visit: Payer: Self-pay

## 2023-10-23 NOTE — Telephone Encounter (Signed)
 Copied from CRM [id number]. Topic: Clinical - Red Word Triage >> Oct 23, 2023  2:11 PM Chrystal Crape R wrote: Pt calling to sch an appointment but has answered yes  to new symptoms one of which is a swollen hand.  Chief Complaint: left hand thumb Symptoms: swelling Frequency: for 1 year Pertinent Negatives: Patient denies fever, sob, cp Disposition: [] ED /[] Urgent Care (no appt availability in office) / [x] Appointment(In office/virtual)/ []  [facility]/ [] Home Care/ [] Refused Recommended Disposition /[] [place] [facility]/ []  Follow-up with PCP Additional Notes: apt scheduled; care advice given, denies questions; instructed to go to ER if becomes worse.   Reason for Disposition  [1] Swelling (puffiness of hand or hands) [2] is a chronic symptom (recurrent or ongoing AND present > 4 weeks)  Answer Assessment - Initial Assessment Questions 1. ONSET: "When did the swelling start?" (e.g., minutes, hours, days)     A year 2. LOCATION: "What part of the hand is swollen?"  "Are both hands swollen or just one hand?"     Thumb on left hand 3. SEVERITY: "How bad is the swelling?" (e.g., localized; mild, moderate, severe)   - BALL OR LUMP: small ball or lump   - LOCALIZED: puffy or swollen area or patch of skin   - JOINT SWELLING: swelling of a joint   - MILD: puffiness or mild swelling of fingers or hand   - MODERATE: fingers and hand are swollen   - SEVERE: swelling of entire hand and up into forearm     On and off 4. REDNESS: "Does the swelling look red or infected?"     no 5. PAIN: "Is the swelling painful to touch?" If Yes, ask: "How painful is it?"   (Scale 1-10; mild, moderate or severe)     On and off 6. FEVER: "Do you have a fever?" If Yes, ask: "What is it, how was it measured, and when did it start?"      no 7. CAUSE: "What do you think is causing the hand swelling?" (e.g., heat, insect bite, pregnancy, recent injury)     unknown 8. MEDICAL HISTORY: "Do you have a history of  heart failure, kidney disease, liver failure, or cancer?"     denies 9. RECURRENT SYMPTOM: "Have you had hand swelling before?" If Yes, ask: "When was the last time?" "What happened that time?"     Yes for a year 10. OTHER SYMPTOMS: "Do you have any other symptoms?" (e.g., blurred vision, difficulty breathing, headache)       no 11. PREGNANCY: "Is there any chance you are pregnant?" "When was your last menstrual period?"       na  Protocols used: Hand Swelling-A-AH

## 2023-10-25 NOTE — Progress Notes (Unsigned)
 CC:  headaches, occipital neuralgia  Follow-up Visit  Last visit: 02/16/2022 with Dr. Billy Bue  Brief HPI: 48 year old female with a history of COPD, anxiety, congenital deafness who follows in clinic for migraines and left occipital neuralgia. 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  At her last visit with Dr. Billy Bue over 1.5 years ago, duloxetine  dosage increased to 40 mg daily and increase sumatriptan  to 100 mg as needed.  Interval History:    She was started on gabapentin  by PCP last August for peripheral neuropathy   Headaches and neck pain improved initially with Cymbalta . She has tolerated Cymbalta  well without side effects. The pain started to return about 4 days ago. She has started to have tingling on the left side of her head again. Had a severe headache last night and took Imitrex  for rescue, but it was not effective. It does not cause side effects. She has not yet scheduled neck PT.  EMG was suggestive of bilateral tarsal tunnel syndrome and she was referred to podiatry. She is working on getting inserts for her shoes.  Headache days per month: 4 Headache free days per month: 26  Current Headache Regimen: Preventative: Cymbalta  20 mg daily Abortive: Imitrex  50 mg PRN  Prior Therapies                                  Imitrex  50 mg PRN Topamax  25 mg QHS - tingling Cymbalta  20 mg daily Aleve       ROS:   14 system review of systems performed and negative with exception of those listed in HPI  Outpatient Encounter Medications as of 10/26/2023  Medication Sig   Cholecalciferol (VITAMIN D3) 50 MCG (2000 UT) capsule TAKE 1 CAPSULE BY MOUTH EVERY DAY   DULoxetine  (CYMBALTA ) 20 MG capsule TAKE 2 CAPSULES BY MOUTH EVERY DAY   FLOVENT  HFA 44 MCG/ACT inhaler INHALE 2 PUFFS BY MOUTH TWICE A DAY (Patient not taking: Reported on 02/16/2022)   gabapentin  (NEURONTIN )  300 MG capsule TAKE 1 CAPSULE BY MOUTH EVERYDAY AT BEDTIME   naproxen  (NAPROSYN ) 500 MG tablet TAKE 1 TABLET (500 MG TOTAL) BY MOUTH TWICE A DAY WITH FOOD AS NEEDED (Patient not taking: Reported on 12/30/2022)   nicotine  (NICODERM CQ  - DOSED IN MG/24 HOURS) 21 mg/24hr patch Place 1 patch (21 mg total) onto the skin daily. (Patient not taking: Reported on 12/30/2022)   promethazine  (PHENERGAN ) 12.5 MG tablet TAKE 1 TABLET BY MOUTH EVERY DAY AS NEEDED NAUSEA/VOMITING (Patient not taking: Reported on 12/30/2022)   SUMAtriptan  (IMITREX ) 100 MG tablet Take 1 tablet (100 mg total) by mouth every 2 (two) hours as needed for migraine. May repeat in 2 hours if headache persists or recurs. (Patient not taking: Reported on 12/30/2022)   No facility-administered encounter medications on file as of 10/26/2023.   Past Medical History:  Diagnosis Date   Anxiety    Arthritis    Blood transfusion without reported diagnosis 2015   COPD (chronic obstructive pulmonary disease) (HCC) 2015   Cyst of ovary    right   Deaf    Depression    Female bladder prolapse 2019   Hyperthyroidism    Menorrhagia    Neuromuscular disorder (HCC) 2009   Pelvic pain    SVD (spontaneous vaginal delivery)    Past Surgical History:  Procedure Laterality  Date   DIAGNOSTIC LAPAROSCOPY  2003   ovarian cyst removed   FOOT SURGERY     LAPAROSCOPIC ASSISTED VAGINAL HYSTERECTOMY N/A 08/12/2014   Procedure: LAPAROSCOPIC ASSISTED VAGINAL HYSTERECTOMY;  Surgeon: Ana Balling, MD;  Location: [facility];  Service: Gynecology;  Laterality: N/A;   LAPAROSCOPIC SALPINGO OOPHERECTOMY Right 08/12/2014   Procedure: REMOVE RIGHT SALPINGO OOPHORECTOMY;  Surgeon: Ana Balling, MD;  Location: [facility];  Service: Gynecology;  Laterality: Right;   LAPAROSCOPY N/A 07/17/2012   Procedure: LAPAROSCOPY OPERATIVE;  Surgeon: Verlyn Goad, MD;  Location: [facility];  Service: Gynecology;  Laterality: N/A;  left oopherectomy and salpingectomy   MULTIPLE EXTRACTIONS WITH  ALVEOLOPLASTY N/A 06/06/2014   Procedure: MULTIPLE EXTRACTIONS WITH ALVEOLOPLASTY/REMOVAL OF TORI;  Surgeon: Cornelia Dieter, DDS;  Location: [facility];  Service: Oral Surgery;  Laterality: N/A;   ovary tumor     cyst   WISDOM TOOTH EXTRACTION            Physical Exam:   Vital Signs: LMP 06/12/2014 (Exact Date)  GENERAL:  well appearing, in no acute distress, alert  SKIN:  Color, texture, turgor normal. No rashes or lesions HEAD:  Normocephalic/atraumatic. RESP: normal respiratory effort MSK:  +tenderness to palpation over bilateral occipital notch, limited cervical ROM turning head left>right  NEUROLOGICAL: Mental Status: Alert, oriented to person, place and time, Follows commands, and Speech fluent and appropriate. Cranial Nerves: PERRL, face symmetric, no dysarthria, deaf Motor: moves all extremities equally Gait: normal-based.      IMPRESSION: 48 year old female with a history of COPD, anxiety, congenital deafness who presents for follow up of migraines and occipital neuralgia. Pain had initially improved with Cymbalta , but has started to return in the past few days. Will increase Cymbalta  to 40 mg daily and increase Imitrex  to 100 mg PRN. Encouraged her to schedule neck PT to help with her occipital neuralgia and cervicalgia.  PLAN: -Prevention: Increase Cymbalta  to 40 mg daily -Rescue: Increase Imitrex  to 100 mg PRN -Encouraged patient to schedule neck PT   I personally spent a total of *** minutes in the care of the patient today including {Time Based Coding:210964241}.  Johny Nap, AGNP-BC  [facility] [address] [place], [place] [address]  Phone [phone] Fax [phone] Note: This document was prepared with digital dictation and possible smart phrase technology. Any transcriptional errors that result from this process are unintentional.

## 2023-10-26 ENCOUNTER — Encounter: Payer: Self-pay | Admitting: Adult Health

## 2023-10-26 ENCOUNTER — Ambulatory Visit: Payer: Medicaid Other | Admitting: Adult Health

## 2023-10-26 VITALS — BP 119/81 | HR 68 | Ht 68.0 in | Wt 145.0 lb

## 2023-10-26 DIAGNOSIS — G43009 Migraine without aura, not intractable, without status migrainosus: Secondary | ICD-10-CM | POA: Diagnosis not present

## 2023-10-26 DIAGNOSIS — M5481 Occipital neuralgia: Secondary | ICD-10-CM

## 2023-10-26 MED ORDER — SUMATRIPTAN SUCCINATE 100 MG PO TABS: 100.0000 mg | ORAL_TABLET | ORAL | 6 refills | Status: AC | PRN

## 2023-10-26 MED ORDER — DULOXETINE HCL 40 MG PO CPEP: 40.0000 mg | ORAL_CAPSULE | Freq: Every day | ORAL | 3 refills | Status: AC

## 2023-10-26 NOTE — Patient Instructions (Addendum)
 Your Plan:  Continue duloxetine  40mg  daily for headache prevention  Use of Imitrex  as needed for migraine rescue   Please call with any worsening headaches      Follow up in 1 year or call earlier if needed     Thank you for coming to see us  at [facility]. I hope we have been able to provide you high quality care today.  You may receive a patient satisfaction survey over the next few weeks. We would appreciate your feedback and comments so that we may continue to improve ourselves and the health of our patients.

## 2023-11-03 ENCOUNTER — Other Ambulatory Visit: Payer: Self-pay | Admitting: Neurology

## 2023-11-05 ENCOUNTER — Other Ambulatory Visit: Payer: Self-pay | Admitting: Adult Health

## 2023-12-12 ENCOUNTER — Telehealth: Payer: Self-pay | Admitting: Internal Medicine

## 2023-12-12 NOTE — Telephone Encounter (Signed)
 Unable to complete call, pts # on file states I need a password followed by a pound key tried calling pts husband # unable to LVM as well

## 2023-12-14 ENCOUNTER — Encounter: Payer: Self-pay | Admitting: Internal Medicine

## 2023-12-14 ENCOUNTER — Ambulatory Visit: Payer: Self-pay | Attending: Internal Medicine | Admitting: Internal Medicine

## 2023-12-14 VITALS — BP 108/73 | HR 51 | Temp 97.9°F | Ht 68.0 in

## 2023-12-14 DIAGNOSIS — G8929 Other chronic pain: Secondary | ICD-10-CM

## 2023-12-14 DIAGNOSIS — M79645 Pain in left finger(s): Secondary | ICD-10-CM | POA: Diagnosis not present

## 2023-12-14 DIAGNOSIS — M19041 Primary osteoarthritis, right hand: Secondary | ICD-10-CM

## 2023-12-14 DIAGNOSIS — M19042 Primary osteoarthritis, left hand: Secondary | ICD-10-CM | POA: Diagnosis not present

## 2023-12-14 DIAGNOSIS — F330 Major depressive disorder, recurrent, mild: Secondary | ICD-10-CM

## 2023-12-14 DIAGNOSIS — M67442 Ganglion, left hand: Secondary | ICD-10-CM

## 2023-12-14 NOTE — Patient Instructions (Signed)
 VISIT SUMMARY:  During your visit, we discussed the pain and swelling in your left thumb, a new mass on your left index finger, and your ongoing mental health management. We also examined your hand joints for signs of arthritis.  YOUR PLAN:  -LEFT THUMB PAIN AND SWELLING: You have been experiencing chronic pain and swelling in your left thumb for over a year, likely due to a previous injury. The pain worsens with use and cold exposure. We will refer you to an orthopedic specialist to evaluate for any potential tendon injury and to assess the mass on your left index finger, which is likely a cyst. Continue taking Aleve  as needed for pain relief.  -SUSPECTED OSTEOARTHRITIS: Your hand joints show signs of osteoarthritis, a condition where the protective cartilage that cushions the ends of your bones wears down over time. We will screen for rheumatoid arthritis with a blood test to rule out other causes of joint changes.  -DEPRESSION: You are experiencing mild depressive symptoms, which you have been managing through regular therapy sessions. Continue with your weekly therapy sessions to help manage your symptoms.  INSTRUCTIONS:  Please follow up with the orthopedic specialist for an evaluation of your left thumb and the mass on your left index finger. Additionally, get the blood test done to screen for rheumatoid arthritis. Continue your weekly therapy sessions to manage your mental health.

## 2023-12-14 NOTE — Progress Notes (Signed)
 Patient ID: Deseri D Rizzo, female    DOB: 30-Jun-1975  MRN: [id number]  CC: Hand Pain (Thumb swelling / pain on L hand - difficulty lifting objects /Mass on pointer finger of L hand /Will bring in imm rec )   Subjective: Ohana Chamberland is a 48 y.o. female who presents for chronic ds management. Charlie Mule from [facility] ([facility] [id number]) is used.  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  History of Present Illness HUMA IMHOFF is a 48 year old female who presents with left thumb pain and swelling.  She has experienced pain and swelling in her left thumb for over a year after hyperextending it while washing a heavy pot. Initially, the swelling was significant but has decreased over time, though the pain persists. The pain is intermittent, worsens with hand use and exposure to cold, and is accompanied by numbness followed by a burning sensation as it warms up. Denies any chronic pain and stiffness in the joints of the hands.  She occasionally takes Aleve  for the pain, but usage is infrequent. No swelling or pain in her feet or ankles.  Recently, she noticed a mass on her left index finger, discovered the day before the visit. No pain or stiffness in her hands.  Her family history includes her mother having RA.   Depression screen is positive. She mentions her mother's passing last year, which led her to seek therapy to manage her mental health. She sees a therapist weekly.      Patient Active Problem List   Diagnosis Date Noted   Thrombocytosis 08/31/2021   Elevated platelet count 08/10/2021   Migraine without aura and without status migrainosus, not intractable 05/27/2021   Tinnitus of right ear 05/27/2021   Congenital deafness 05/27/2021    Peripheral polyneuropathy 05/27/2021   Tobacco dependence 05/27/2021   Generalized anxiety disorder 05/27/2021   Anxiety 08/14/2018   Post-operative state 08/12/2014   Dermoid cyst 06/19/2014   Health care maintenance 10/09/2013   Right knee pain 10/09/2013   Insomnia 09/09/2013   COPD (chronic obstructive pulmonary disease) with chronic bronchitis (HCC) 09/09/2013   Tobacco abuse 09/09/2013   Endometriosis 11/27/2012   Menorrhagia 01/13/2012   Dysmenorrhea 01/13/2012   Back pain 01/13/2012   Ovarian cyst 01/13/2012     Current Outpatient Medications on File Prior to Visit  Medication Sig Dispense Refill   DULoxetine  40 MG CPEP Take 1 capsule (40 mg total) by mouth daily. 90 capsule 3   gabapentin  (NEURONTIN ) 300 MG capsule TAKE 1 CAPSULE BY MOUTH EVERYDAY AT BEDTIME 90 capsule 1   SUMAtriptan  (IMITREX ) 100 MG tablet Take 1 tablet (100 mg total) by mouth as needed for migraine. May repeat in 2 hours if headache persists or recurs. 10 tablet 6   No current facility-administered medications on file prior to visit.    Allergies  Allergen Reactions   Tramadol  Nausea And Vomiting   Vicodin [Hydrocodone -Acetaminophen ] Nausea And Vomiting    Social History   Socioeconomic History   Marital status: Significant Other    Spouse name: Not on file   Number of children: 2   Years of education: Not on file   Highest education level: High school graduate  Occupational History   Occupation: unemployed  Tobacco Use   Smoking  status: Every Day    Current packs/day: 0.50    Average packs/day: 0.5 packs/day for 15.0 years (7.5 ttl pk-yrs)    Types: Cigarettes   Smokeless tobacco: Never   Tobacco comments:    04/10/2019 reports down to 1/2 pack per day  Vaping Use   Vaping status: Never Used  Substance and Sexual Activity   Alcohol use: No   Drug use: No   Sexual activity: Not Currently    Birth control/protection: None  Other Topics Concern   Not on file  Social History  Narrative   Caffeine- coffee 1-2 c, 4-5  Mtn Dew daily   Social Drivers of Health   Financial Resource Strain: Medium Risk (09/28/2021)   Overall Financial Resource Strain (CARDIA)    Difficulty of Paying Living Expenses: Somewhat hard  Food Insecurity: Food Insecurity Present (09/28/2021)   Hunger Vital Sign    Worried About Running Out of Food in the Last Year: Sometimes true    Ran Out of Food in the Last Year: Sometimes true  Transportation Needs: No Transportation Needs (09/28/2021)   PRAPARE - Administrator, Civil Service (Medical): No    Lack of Transportation (Non-Medical): No  Physical Activity: Not on file  Stress: Not on file  Social Connections: Not on file  Intimate Partner Violence: Not on file    Family History  Problem Relation Age of Onset   Cancer Mother    Cancer Father    Cancer Maternal Grandmother    Breast cancer Maternal Grandmother    Breast cancer Paternal Aunt     Past Surgical History:  Procedure Laterality Date   DIAGNOSTIC LAPAROSCOPY  2003   ovarian cyst removed   FOOT SURGERY     LAPAROSCOPIC ASSISTED VAGINAL HYSTERECTOMY N/A 08/12/2014   Procedure: LAPAROSCOPIC ASSISTED VAGINAL HYSTERECTOMY;  Surgeon: Harland JAYSON Birkenhead, MD;  Location: [facility];  Service: Gynecology;  Laterality: N/A;   LAPAROSCOPIC SALPINGO OOPHERECTOMY Right 08/12/2014   Procedure: REMOVE RIGHT SALPINGO OOPHORECTOMY;  Surgeon: Harland JAYSON Birkenhead, MD;  Location: [facility];  Service: Gynecology;  Laterality: Right;   LAPAROSCOPY N/A 07/17/2012   Procedure: LAPAROSCOPY OPERATIVE;  Surgeon: Winton Felt, MD;  Location: [facility];  Service: Gynecology;  Laterality: N/A;  left oopherectomy and salpingectomy   MULTIPLE EXTRACTIONS WITH ALVEOLOPLASTY N/A 06/06/2014   Procedure: MULTIPLE EXTRACTIONS WITH ALVEOLOPLASTY/REMOVAL OF TORI;  Surgeon: Glendia CHRISTELLA Primrose, DDS;  Location: [facility];  Service: Oral Surgery;  Laterality: N/A;   ovary tumor     cyst   WISDOM TOOTH EXTRACTION      ROS: Review of  Systems Negative except as stated above  PHYSICAL EXAM: BP 108/73 (BP Location: Left Arm, Patient Position: Sitting, Cuff Size: Normal)   Pulse (!) 51   Temp 97.9 F (36.6 C) (Oral)   Ht 5' 8 (1.727 m)   LMP 06/12/2014 (Exact Date)   SpO2 92%   BMI 22.05 kg/m   Physical Exam  General appearance - alert, well appearing, and in no distress Mental status - normal mood, behavior, dress, motor activity, and thought processes Musculoskeletal -hands: She has prominence of the MCP joints of the index and middle finger on both hands.  She has swelling and joint deformity mild of all of her PIP and DIP joints.  Good range of motion of all joints in the hands including the wrists.  No signs of active tenosynovitis. Left thumb: Does not appear swollen at this time.  She has some discomfort with flexion  and extension of the thumb. Noted to have cysts on the dorsal surface of the left index finger between the PIP and DIP joint.  It is nontender to touch. Feet: There are no signs of tenosynovitis noted of the toes.  She has noninflamed bunions of both big toes       Latest Ref Rng & Units 09/17/2022   11:14 AM 08/10/2021    1:22 PM 05/27/2021    3:16 PM  CMP  Glucose 70 - 99 mg/dL 82  93  82   BUN 6 - 24 mg/dL 8  13  8    Creatinine 0.57 - 1.00 mg/dL 9.22  9.22  9.28   Sodium 134 - 144 mmol/L 139  139  141   Potassium 3.5 - 5.2 mmol/L 4.1  3.7  4.5   Chloride 96 - 106 mmol/L 102  106  103   CO2 20 - 29 mmol/L 24  26  23    Calcium 8.7 - 10.2 mg/dL 9.4  9.2  9.5   Total Protein 6.0 - 8.5 g/dL 6.4  7.1  6.6   Total Bilirubin 0.0 - 1.2 mg/dL 0.3  0.3  <9.7   Alkaline Phos 44 - 121 IU/L 92  113  138   AST 0 - 40 IU/L 23  19  28    ALT 0 - 32 IU/L 18  17  22     Lipid Panel  No results found for: CHOL, TRIG, HDL, CHOLHDL, VLDL, LDLCALC, LDLDIRECT  CBC    Component Value Date/Time   WBC 5.4 09/17/2022 1114   WBC 7.9 08/31/2021 0943   WBC 9.2 11/19/2017 1123   RBC 4.16  09/17/2022 1114   RBC 4.35 08/31/2021 0943   HGB 13.2 09/17/2022 1114   HCT 38.4 09/17/2022 1114   PLT 470 (H) 09/17/2022 1114   MCV 92 09/17/2022 1114   MCH 31.7 09/17/2022 1114   MCH 30.8 08/31/2021 0943   MCHC 34.4 09/17/2022 1114   MCHC 33.8 08/31/2021 0943   RDW 13.1 09/17/2022 1114   LYMPHSABS 2.5 09/17/2022 1114   MONOABS 0.7 08/31/2021 0943   EOSABS 0.2 09/17/2022 1114   BASOSABS 0.1 09/17/2022 1114    ASSESSMENT AND PLAN: 1. Chronic thumb pain, left (Primary) Patient with chronic pain in the left thumb that is intermittent post hyperextension over a year ago while lifting a heavy pot. Exam today largely unrevealing. Will refer to ortho for opinion. Okay to continue Aleve  as needed  2. Ganglion cyst of finger of left hand This is not bothersome to her but I recommend that she has Ortho take a look at this as well. - Rheumatoid factor  3. Arthritis of both hands Patient with features on exam of joint enlargements and pattern suggestive of RA.  She is asymptomatic.  We will check rheumatoid factor and CCP. - AMB referral to orthopedics - CYCLIC CITRUL PEPTIDE ANTIBODY, IGG/IGA  4. Major depressive disorder, recurrent episode, mild (HCC) Reports doing well with therapy sessions.  Patient was given the opportunity to ask questions.  Patient verbalized understanding of the plan and was able to repeat key elements of the plan.   This documentation was completed using Paediatric nurse.  Any transcriptional errors are unintentional.  No orders of the defined types were placed in this encounter.    Requested Prescriptions    No prescriptions requested or ordered in this encounter    No follow-ups on file.  Barnie Louder, MD, FACP

## 2023-12-15 ENCOUNTER — Ambulatory Visit: Payer: Self-pay | Admitting: Internal Medicine

## 2023-12-16 LAB — RHEUMATOID FACTOR: Rheumatoid fact SerPl-aCnc: <10 [IU]/mL (ref <14.0)

## 2023-12-16 LAB — CYCLIC CITRUL PEPTIDE ANTIBODY, IGG/IGA: Cyclic Citrullin Peptide Ab: 6 U (ref 0–19)

## 2024-01-17 ENCOUNTER — Ambulatory Visit: Admitting: Orthopedic Surgery

## 2024-10-28 ENCOUNTER — Ambulatory Visit: Admitting: Adult Health
